# Patient Record
Sex: Female | Born: 1965
Health system: Southern US, Community
[De-identification: ages and names within clinical notes are randomized; demographics above are authoritative.]

## PROBLEM LIST (undated history)

## (undated) DIAGNOSIS — M1990 Unspecified osteoarthritis, unspecified site: Secondary | ICD-10-CM

## (undated) DIAGNOSIS — F17200 Nicotine dependence, unspecified, uncomplicated: Secondary | ICD-10-CM

## (undated) DIAGNOSIS — R55 Syncope and collapse: Secondary | ICD-10-CM

## (undated) DIAGNOSIS — E785 Hyperlipidemia, unspecified: Secondary | ICD-10-CM

## (undated) DIAGNOSIS — I10 Essential (primary) hypertension: Secondary | ICD-10-CM

## (undated) DIAGNOSIS — J45909 Unspecified asthma, uncomplicated: Secondary | ICD-10-CM

## (undated) DIAGNOSIS — D259 Leiomyoma of uterus, unspecified: Secondary | ICD-10-CM

## (undated) HISTORY — DX: Syncope and collapse: R55

## (undated) HISTORY — DX: Unspecified asthma, uncomplicated: J45.909

## (undated) HISTORY — PX: TUBAL LIGATION: SHX77

## (undated) HISTORY — DX: Nicotine dependence, unspecified, uncomplicated: F17.200

## (undated) HISTORY — PX: LAPAROSCOPIC CHOLECYSTECTOMY: SUR755

## (undated) HISTORY — DX: Essential (primary) hypertension: I10

## (undated) HISTORY — DX: Leiomyoma of uterus, unspecified: D25.9

## (undated) HISTORY — DX: Hyperlipidemia, unspecified: E78.5

## (undated) HISTORY — PX: VEIN SURGERY: SHX48

## (undated) HISTORY — DX: Unspecified osteoarthritis, unspecified site: M19.90

---

## 1998-03-04 ENCOUNTER — Emergency Department (HOSPITAL_COMMUNITY): Admission: EM | Admit: 1998-03-04 | Discharge: 1998-03-04 | Payer: Self-pay | Admitting: Internal Medicine

## 1999-12-11 ENCOUNTER — Other Ambulatory Visit: Admission: RE | Admit: 1999-12-11 | Discharge: 1999-12-11 | Payer: Self-pay | Admitting: *Deleted

## 2000-11-16 HISTORY — PX: HYSTEROSCOPY: SHX211

## 2001-02-22 ENCOUNTER — Other Ambulatory Visit: Admission: RE | Admit: 2001-02-22 | Discharge: 2001-02-22 | Payer: Self-pay | Admitting: *Deleted

## 2003-09-04 ENCOUNTER — Other Ambulatory Visit: Admission: RE | Admit: 2003-09-04 | Discharge: 2003-09-04 | Payer: Self-pay | Admitting: *Deleted

## 2004-09-15 ENCOUNTER — Other Ambulatory Visit: Admission: RE | Admit: 2004-09-15 | Discharge: 2004-09-15 | Payer: Self-pay | Admitting: *Deleted

## 2005-09-21 ENCOUNTER — Other Ambulatory Visit: Admission: RE | Admit: 2005-09-21 | Discharge: 2005-09-21 | Payer: Self-pay | Admitting: *Deleted

## 2005-12-03 ENCOUNTER — Ambulatory Visit (HOSPITAL_BASED_OUTPATIENT_CLINIC_OR_DEPARTMENT_OTHER): Admission: RE | Admit: 2005-12-03 | Discharge: 2005-12-03 | Payer: Self-pay | Admitting: Gynecology

## 2005-12-03 ENCOUNTER — Encounter (INDEPENDENT_AMBULATORY_CARE_PROVIDER_SITE_OTHER): Payer: Self-pay | Admitting: Specialist

## 2006-11-04 ENCOUNTER — Other Ambulatory Visit: Admission: RE | Admit: 2006-11-04 | Discharge: 2006-11-04 | Payer: Self-pay | Admitting: Gynecology

## 2006-11-24 ENCOUNTER — Encounter: Admission: RE | Admit: 2006-11-24 | Discharge: 2006-11-24 | Payer: Self-pay | Admitting: Gynecology

## 2008-01-08 ENCOUNTER — Other Ambulatory Visit: Admission: RE | Admit: 2008-01-08 | Discharge: 2008-01-08 | Payer: Self-pay | Admitting: Gynecology

## 2008-01-16 ENCOUNTER — Encounter: Admission: RE | Admit: 2008-01-16 | Discharge: 2008-01-16 | Payer: Self-pay | Admitting: Gynecology

## 2010-08-19 ENCOUNTER — Other Ambulatory Visit: Payer: Self-pay | Admitting: Family Medicine

## 2010-08-19 DIAGNOSIS — Z1239 Encounter for other screening for malignant neoplasm of breast: Secondary | ICD-10-CM

## 2010-08-25 ENCOUNTER — Ambulatory Visit
Admission: RE | Admit: 2010-08-25 | Discharge: 2010-08-25 | Disposition: A | Discharge status: Home / Self Care | Payer: 59 | Source: Ambulatory Visit | Attending: Family Medicine | Admitting: Family Medicine

## 2010-08-25 DIAGNOSIS — Z1239 Encounter for other screening for malignant neoplasm of breast: Secondary | ICD-10-CM

## 2010-08-26 ENCOUNTER — Ambulatory Visit: Payer: Self-pay

## 2010-12-04 NOTE — H&P (Signed)
NAMEDEVANEE, POMPLUN           ACCOUNT NO.:  [id number]   MEDICAL RECORD NO.:  [id number]          PATIENT TYPE:  AMB   LOCATION:  [facility]                         FACILITY:  [facility]   PHYSICIAN:  Timothy P. Fontaine, M.D.DATE OF BIRTH:  [birth year]   DATE OF ADMISSION:  12/03/2005  DATE OF DISCHARGE:                                HISTORY & PHYSICAL   CHIEF COMPLAINT:  Menorrhagia.   HISTORY OF PRESENT ILLNESS:  A 45 year old G22, P3 female, history of tubal  sterilization with a history of menorrhagia.  Outpatient evaluation included  a normal TSH, a hemoglobin of 13.9, and a sonohysterogram which shows an 18  mm uterine polyp.  The patient is admitted at this time for hysteroscopy,  removal of the polyp.   PAST MEDICAL HISTORY:  Uncomplicated.   PAST SURGICAL HISTORY:  1.  Tubal sterilization.  2.  Cholecystectomy.  3.  Vein stripping.   CURRENT MEDICATIONS:  Vitamins.   ALLERGIES:  No medications.   REVIEW OF SYSTEMS:  Noncontributory.   FAMILY HISTORY:  Noncontributory.   SOCIAL HISTORY:  Noncontributory.   ADMISSION PHYSICAL EXAMINATION:  VITAL SIGNS:  Afebrile.  Vital signs are  stable.  HEENT:  Normal.  LUNGS:  Clear.  CARDIAC:  Regular rate.  No murmurs, rubs, or gallops.  ABDOMEN:  Benign.  PELVIC:  External BUS, vagina normal.  Cervix is normal.  Uterus grossly  normal in size, midline, and mobile.  Adnexa without masses or tenderness.  Rectovaginal examination is normal.   ASSESSMENT:  A 45 year old G3, P3 female, history of menorrhagia.  Hemoglobin 13.  TSH normal.  Ultrasound shows small posterior myoma 30 mm  and an 18 mm endometrial polyp.  Patient is admitted for hysteroscopy,  removal of the polyp, D&C.  I reviewed with the patient what is involved  with the procedure to include instrumentation, dilatation, and sharp  curettage.  The use of the resectoscope was reviewed and the acute  intraoperative/postoperative risks were discussed including  infection,  prolonged antibiotics, hemorrhage necessitating transfusion, and the risks  of transfusion.  The risks of inadvertent injury to internal organs with  uterine perforation including bowel, bladder, ureters, vessels, and nerves  necessitating major exploratory reparative surgeries and future reparative  surgeries including ostomy formation was all discussed, understood, and  accepted.  The risks of distended media absorption leading to metabolic  complications such as coma and seizures was also  reviewed with her.  She understands there are no guarantees as far as  menorrhagia relief, that her heavy periods may continue/change following the  procedure and she understands and accepts this.  Patient's questions are  answered to her satisfaction.  She is ready to proceed with surgery.      Timothy P. Fontaine, [name]  Electronically Signed     TPF/MEDQ  D:  12/03/2005  T:  12/03/2005  Job:  [id number]

## 2010-12-04 NOTE — Op Note (Signed)
[name], Laurie Chang           ACCOUNT NO.:  [id number]   MEDICAL RECORD NO.:  [id number]          PATIENT TYPE:  AMB   LOCATION:  [facility]                         FACILITY:  [facility]   PHYSICIAN:  Timothy P. Fontaine, M.D.DATE OF BIRTH:  14-Jul-1966   DATE OF PROCEDURE:  12/03/2005  DATE OF DISCHARGE:                                 OPERATIVE REPORT   PREOPERATIVE DIAGNOSES:  Endometrial polyp, leiomyomata, menorrhagia.   POSTOPERATIVE DIAGNOSES:  Endometrial polyp, leiomyomata, menorrhagia.   PROCEDURE:  Hysteroscopic submucous myomectomy, endometrial polypectomy,  dilatation and curettage.   SURGEON:  Timothy P. Fontaine, [name]   ANESTHESIA:  General.   ESTIMATED BLOOD LOSS:  Minimal.   SORBITOL DISCREPANCY:  Approximately 400 mL.   SPECIMENS:  1.  Endometrial curetting.  2.  Endometrial resection.  3.  Submucous myoma.   COMPLICATIONS:  None   FINDINGS:  Hysteroscopic adequate with fundus, anterior and posterior  surfaces, lower uterine segment, endocervical canal, right and left tubal  ostia all visualized. Undulating of polypoid gross posterior uterine surface  resected. Subsequent to D&C submucous myoma approximately 1/2 diameter  protruding into the cavity. Progressive resection removed all of the myoma   PROCEDURE:  The patient was taken to the operating room, underwent general  anesthesia, was placed in the low dorsal lithotomy position, received a  perineal vaginal preparation per nursing personnel.  The bladder emptied  with in-and-out Foley catheterization done in sterile technique.  EUA  performed and the patient was draped in the usual fashion.  The cervix was  visualized with a speculum.  The anterior lip grasped with a single-tooth  tenaculum and the cervix was gently and gradually dilated to admit the  operative hysteroscope.  Hysteroscopy was performed with findings noted  above.  Using the right angle resectoscopic loop, the endometrial polyps  were  resected at the level of the surrounding endometrium.  These were sent  as a separate specimens. Subsequently, a sharp curettage was then performed  and, again, sent to pathology.  After the sharp curettage, the posterior  submucous myoma became more apparent with approximately 1/2 of the diameter  protruding into the cavity.  Through progressive passes and resection using  the right angle resectoscopic loop, the myoma was resected with more and  more of the myoma protruding into the cavity with each decompression of the  uterine cavity.  Subsequently, the entire myoma was resected without  difficulty and at the end of the procedure, hysteroscopy was performed  showing good distension of the uterine cavity, no evidence of perforation,  and good hemostasis.  The instruments were then all removed.  The  tenaculum site that showed good hemostasis.  The speculum was removed.  The  patient was placed in the supine position, awakened without difficulty, and  taken to the recovery room in good condition having tolerated the procedure  well.      Timothy P. Fontaine, [name]  Electronically Signed     TPF/MEDQ  D:  12/03/2005  T:  12/04/2005  Job:  [id number]

## 2010-12-07 ENCOUNTER — Other Ambulatory Visit: Payer: Self-pay | Admitting: Family Medicine

## 2010-12-07 ENCOUNTER — Ambulatory Visit: Payer: 59 | Admitting: Gynecology

## 2010-12-07 ENCOUNTER — Ambulatory Visit (HOSPITAL_COMMUNITY)
Admission: RE | Admit: 2010-12-07 | Discharge: 2010-12-07 | Disposition: A | Discharge status: Home / Self Care | Payer: 59 | Source: Ambulatory Visit | Attending: Family Medicine | Admitting: Family Medicine

## 2010-12-07 DIAGNOSIS — M47814 Spondylosis without myelopathy or radiculopathy, thoracic region: Secondary | ICD-10-CM | POA: Insufficient documentation

## 2010-12-07 DIAGNOSIS — R1031 Right lower quadrant pain: Secondary | ICD-10-CM

## 2010-12-07 DIAGNOSIS — M47817 Spondylosis without myelopathy or radiculopathy, lumbosacral region: Secondary | ICD-10-CM | POA: Insufficient documentation

## 2010-12-07 DIAGNOSIS — N9489 Other specified conditions associated with female genital organs and menstrual cycle: Secondary | ICD-10-CM | POA: Insufficient documentation

## 2010-12-07 DIAGNOSIS — D259 Leiomyoma of uterus, unspecified: Secondary | ICD-10-CM | POA: Insufficient documentation

## 2010-12-07 DIAGNOSIS — Z9089 Acquired absence of other organs: Secondary | ICD-10-CM | POA: Insufficient documentation

## 2010-12-07 DIAGNOSIS — N838 Other noninflammatory disorders of ovary, fallopian tube and broad ligament: Secondary | ICD-10-CM | POA: Insufficient documentation

## 2010-12-07 MED ORDER — IOHEXOL 300 MG/ML  SOLN
100.0000 mL | Freq: Once | INTRAMUSCULAR | Status: AC | PRN
  Administered 2010-12-07: 100 mL via INTRAVENOUS

## 2010-12-08 ENCOUNTER — Other Ambulatory Visit: Payer: 59 | Admitting: Gynecology

## 2010-12-08 ENCOUNTER — Ambulatory Visit (INDEPENDENT_AMBULATORY_CARE_PROVIDER_SITE_OTHER): Payer: 59 | Admitting: Gynecology

## 2010-12-08 ENCOUNTER — Other Ambulatory Visit: Payer: 59

## 2010-12-08 DIAGNOSIS — N852 Hypertrophy of uterus: Secondary | ICD-10-CM

## 2010-12-08 DIAGNOSIS — D259 Leiomyoma of uterus, unspecified: Secondary | ICD-10-CM

## 2010-12-08 DIAGNOSIS — R109 Unspecified abdominal pain: Secondary | ICD-10-CM

## 2010-12-08 DIAGNOSIS — R823 Hemoglobinuria: Secondary | ICD-10-CM

## 2010-12-08 DIAGNOSIS — N920 Excessive and frequent menstruation with regular cycle: Secondary | ICD-10-CM

## 2010-12-08 DIAGNOSIS — N949 Unspecified condition associated with female genital organs and menstrual cycle: Secondary | ICD-10-CM

## 2010-12-09 ENCOUNTER — Other Ambulatory Visit: Payer: Self-pay | Admitting: Gynecology

## 2010-12-09 ENCOUNTER — Encounter (HOSPITAL_COMMUNITY): Payer: 59

## 2010-12-09 LAB — URINALYSIS, ROUTINE W REFLEX MICROSCOPIC
Bilirubin Urine: NEGATIVE
Ketones, ur: NEGATIVE mg/dL
Nitrite: NEGATIVE
Protein, ur: NEGATIVE mg/dL
Urobilinogen, UA: 0.2 mg/dL (ref 0.0–1.0)

## 2010-12-09 LAB — CBC
MCH: 30.7 pg (ref 26.0–34.0)
MCHC: 33.4 g/dL (ref 30.0–36.0)
MCV: 91.9 fL (ref 78.0–100.0)
Platelets: 335 10*3/uL (ref 150–400)
RDW: 12.9 % (ref 11.5–15.5)
WBC: 12.4 10*3/uL — ABNORMAL HIGH (ref 4.0–10.5)

## 2010-12-09 LAB — COMPREHENSIVE METABOLIC PANEL
ALT: 13 U/L (ref 0–35)
Alkaline Phosphatase: 53 U/L (ref 39–117)
BUN: 10 mg/dL (ref 6–23)
Chloride: 102 mEq/L (ref 96–112)
Glucose, Bld: 111 mg/dL — ABNORMAL HIGH (ref 70–99)
Potassium: 3.6 mEq/L (ref 3.5–5.1)
Total Bilirubin: 0.2 mg/dL — ABNORMAL LOW (ref 0.3–1.2)

## 2010-12-09 LAB — SURGICAL PCR SCREEN: Staphylococcus aureus: INVALID — AB

## 2010-12-10 ENCOUNTER — Ambulatory Visit: Payer: 59 | Admitting: Gynecology

## 2010-12-10 NOTE — H&P (Addendum)
Laurie Chang, Laurie Chang           ACCOUNT NO.:  [id number]  MEDICAL RECORD NO.:  [id number]  LOCATION:  [facility]                           FACILITY:  [facility]  PHYSICIAN:  [name], M.D.DATE OF BIRTH:  [birth year]  DATE OF ADMISSION:  12/09/2010 DATE OF DISCHARGE:                             HISTORY & PHYSICAL   CHIEF COMPLAINT:  Symptomatic leiomyomatous uteri.  HISTORY:  The patient is a 45 year old gravida 3, para 3 (normal spontaneous vaginal deliveries) who had been seen in our office on Dec 08, 2010 as a referral from [facility] as a result of acute right lower quadrant pain which had started the previous week on Thursday and Friday, but said at the weekend that she felt fine and her pain restarted back again on Monday in her right lower quadrant. Her bowel movements have been reported to be normal as well as her urination.  She denied any vomiting, a little bit of nausea few days ago.  Her menstrual cycles had been regular.  Her last menstrual period was reported to be Dec 01, 2010.  She has had a previous tubal sterilization procedure.  When she was seen in the [facility], they had done a CBC which demonstrated white count of 10.8, hemoglobin/hematocrit 14.4 and 43.2 respectively with a platelet count of 404,000.  She had a negative pregnancy test and negative urinalysis.  She was then sent to the hospital for a CT with a working diagnosis of appendicitis.  The radiology report demonstrated that a CT scan showed no evidence of small- bowel structure, normal-appearing appendix, and normal abdominal ultrasound.  Pelvic ultrasound, mildly heterogeneous texture reflecting uterine fibroids.  The right ovary within normal limits.  Left ovary, the size was mildly larger, could not tell if it was a fibroid overlapping over the ovary and she was given Percocet for pain and returned to the [facility] and they referred to our office for further evaluation for  which we had proceeded with evaluating her.  She was having some right lower quadrant discomfort and did not have any rebound or guarding.  Good bowel sounds in all 4 quadrants.  She had no CVA tenderness.  On pelvic examination, the uterus upper limits of normal. Right ovary was palpated.  No tenderness.  Left adnexa, you could feel the fibroid coming off the left uterine sidewall and slightly tender with no guarding or rebound.  Her ultrasound in the office demonstrated a uterus that measured 9.4 x 5.9 x 5.8 cm with the largest one measuring 2 cm in size.  Right ovary again normal and right adnexa no apparent masses.  The left ovary was difficult to identify due to the left subserous myoma with cystic solid echo pattern.  A vascular mass measuring 52 x 34 x 32 mm.  On transabdominal images, the left ovary was seen measuring 3 x 1.3 x 2.3 cm with negative color-flow.  With these findings, I explained to her that she probably had a degenerating fibroid causing her discomfort.  Her hemoglobin and hematocrit remainedstable and white count was 12,000.  She was given Toradol 60 mg IM and was to take Toradol 10 mg q.6 h. p.r.n. and to  stop 24 hours before her planned total abdominal hysterectomy.  PAST MEDICAL HISTORY:  She denies any allergies.  OBSTETRIC/GYNECOLOGIC HISTORY:  Three vaginal deliveries.  MEDICATIONS:  Consisted of calcium and multivitamin as well as recently Toradol 10 mg q.6 h. p.r.n.  OTHER MEDICAL CONDITIONS:  She smokes on a regular basis.  Previous surgeries have included bilateral tubal ligation.  She has had laparoscopic cholecystectomy.  She has had vein stripping.  She has had hysteroscopic myomectomy.  FAMILY HISTORY:  Hypertension as well as breast cancer.  REVIEW OF SYSTEMS:  Unremarkable with the exception of the gynecological portion with leiomyomatous uteri.  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  PHYSICAL EXAMINATION:  VITAL  SIGNS:  The patient weighs 196 pounds. She is 5 feet 5-3/4 inches tall.  Blood pressure 118/78. HEENT:  Unremarkable. NECK:  Supple.  Trachea midline.  No carotid bruits.  No thyromegaly. LUNGS:  Clear to auscultation without rhonchi or wheezes. HEART:  Regular rate and rhythm.  No murmurs or gallops. BREASTS:  Not done. ABDOMEN:  Soft, some slight tenderness in the right lower quadrant but no rebound or guarding. PELVIC:  Bartholin, urethra, Skene glands within normal limits.  Vagina and cervix:  No gross lesions on inspection.  Uterus:  Upper limits of normal.  Right adnexa:  Unremarkable with palpable ovary.  Left adnexa: Normal size.  Fibroid coming off the left uterine sidewall, slightly tender on bimanual examination. RECTAL:  Deferred.  ASSESSMENT:  This is a 45 year old gravida 3, para 3 with previous sterilization procedure with symptomatic leiomyomatous uteri contributing to the pain and attributed to cystic degeneration.  The patient is scheduled to undergo a total abdominal hysterectomy with ovarian conservation this Friday, Dec 11, 2010 at 1:00 p.m.  The risks, benefits, and pros and cons of the operation were discussed with the patient to include the following:  The risk of infection for which she will receive prophylaxis antibiotic.  The risk for deep venous thrombosis and pulmonary embolism because of her age and her smoking history but we will take preventive measures such as placing PSA stockings.  The risk of hemorrhage requiring blood or blood transfusion or blood products with potential risk of anaphylactic reaction to hepatitis and acquired immune deficiency syndrome were discussed and also potential risk of injury or trauma to internal organs.  All these issues were discussed with the patient in detail.  All questions were answered.  We will follow accordingly.  Of note, we also discussed the possibility that she may  lose her left tube and ovary as well but we will make every effort to leave at least one ovary.  All questions were answered and we will follow accordingly.  PLAN:  The patient is scheduled for total abdominal hysterectomy, possible left salpingo-oophorectomy on Friday, Dec 11, 2010 at 1:00 p.m. at [facility].  Please have history and physical available.     Taylin Mans H. Lily Peer, [name]     JHF/MEDQ  D:  12/09/2010  T:  12/10/2010  Job:  [id number]  Electronically Signed by Reynaldo Minium [name] on 01/07/2011 04:57:54 PM

## 2010-12-11 ENCOUNTER — Other Ambulatory Visit: Payer: Self-pay | Admitting: Obstetrics and Gynecology

## 2010-12-11 ENCOUNTER — Inpatient Hospital Stay (HOSPITAL_COMMUNITY)
Admission: EM | Admit: 2010-12-11 | Discharge: 2010-12-14 | DRG: 743 | Disposition: A | Discharge status: Home / Self Care | Payer: 59 | Source: Ambulatory Visit | Attending: Gynecology | Admitting: Gynecology

## 2010-12-11 DIAGNOSIS — R1031 Right lower quadrant pain: Secondary | ICD-10-CM | POA: Diagnosis present

## 2010-12-11 DIAGNOSIS — N946 Dysmenorrhea, unspecified: Principal | ICD-10-CM | POA: Diagnosis present

## 2010-12-11 DIAGNOSIS — Z01818 Encounter for other preprocedural examination: Secondary | ICD-10-CM

## 2010-12-11 DIAGNOSIS — N949 Unspecified condition associated with female genital organs and menstrual cycle: Secondary | ICD-10-CM

## 2010-12-11 DIAGNOSIS — R110 Nausea: Secondary | ICD-10-CM | POA: Diagnosis present

## 2010-12-11 DIAGNOSIS — N920 Excessive and frequent menstruation with regular cycle: Secondary | ICD-10-CM | POA: Diagnosis present

## 2010-12-11 DIAGNOSIS — D259 Leiomyoma of uterus, unspecified: Secondary | ICD-10-CM | POA: Diagnosis present

## 2010-12-11 DIAGNOSIS — Z01812 Encounter for preprocedural laboratory examination: Secondary | ICD-10-CM

## 2010-12-11 HISTORY — PX: ABDOMINAL HYSTERECTOMY: SHX81

## 2010-12-11 HISTORY — PX: OOPHORECTOMY: SHX86

## 2010-12-12 LAB — CBC
Hemoglobin: 10.7 g/dL — ABNORMAL LOW (ref 12.0–15.0)
MCH: 30.2 pg (ref 26.0–34.0)
MCHC: 32.8 g/dL (ref 30.0–36.0)
MCV: 92.1 fL (ref 78.0–100.0)
Platelets: 319 10*3/uL (ref 150–400)

## 2010-12-12 LAB — MRSA CULTURE

## 2010-12-15 NOTE — Discharge Summary (Signed)
  Laurie Chang, Laurie Chang           ACCOUNT NO.:  [id number]  MEDICAL RECORD NO.:  [id number]           PATIENT TYPE:  I  LOCATION:  9310                          FACILITY:  [facility]  PHYSICIAN:  Daniel L. Gottsegen, M.D.DATE OF BIRTH:  10/22/1965  DATE OF ADMISSION:  12/11/2010 DATE OF DISCHARGE:  12/14/2010                              DISCHARGE SUMMARY   The patient is a 45 year old female who was admitted to the hospital by Dr. Lily Peer with fibroids associated with dysmenorrhea, menorrhagia, and pelvic pain.  On the day of admission, she was taken to the operating room and she underwent a total abdominal hysterectomy, right salpingo-oophorectomy.  Postoperatively, she continued to progress and by the second postoperative day, was passing gas and tolerating oral diet and was voiding well, so she was discharged.  She will be seen in the office in 2 days for staple removal. Addendum: Although patient was originally discharged on the second postoperative day, before she left she had increasing nausea and therefore the discharge was cancelled. Her pain medication was changed and her  nausea cleared so she went home the next day.  DISCHARGE MEDICATIONS:  Reglan and Lortab.  DISCHARGE DIET:  Regular.  Final pathology report is not available at the time of dictation. Admission comprehensive metabolic panel was normal except for a slightly elevated glucose at 111.  This admission CBC was normal.  Admission urinalysis was normal.  CONDITION ON DISCHARGE:  Improved.  DISCHARGE DIAGNOSIS:  Symptomatic fibroids.  OPERATIONS:  Total abdominal hysterectomy, right salpingo-oophorectomy.     Daniel L. Eda Paschal, [name]     Tonette Bihari  D:  12/13/2010  T:  12/13/2010  Job:  [id number]  Electronically Signed by Edyth Gunnels [name] on 12/15/2010 08:38:52 AM

## 2011-01-01 ENCOUNTER — Ambulatory Visit (INDEPENDENT_AMBULATORY_CARE_PROVIDER_SITE_OTHER): Payer: 59 | Admitting: Gynecology

## 2011-01-01 DIAGNOSIS — Z9889 Other specified postprocedural states: Secondary | ICD-10-CM

## 2011-01-05 NOTE — Op Note (Signed)
Laurie Chang, Laurie Chang           ACCOUNT NO.:  [id number]  MEDICAL RECORD NO.:  [id number]           PATIENT TYPE:  I  LOCATION:  9310                          [facility]ACILITY:  [facility]  PHYSICIAN:  Clarita Mcelvain H. Lily Peer, M.D.DATE OF BIRTH:  [birth year]  DATE OF PROCEDURE:  12/11/2010 DATE OF DISCHARGE:                              OPERATIVE REPORT   SURGEON:  Bogdan Vivona H. Lily Peer, MD  FIRST ASSISTANT:  Colin Broach, MD  INDICATIONS FOR OPERATION:  A 45 year old gravida 3, para 3 with leiomyomatous uteri and sharp right lower quadrant pain.  PREOPERATIVE DIAGNOSIS:  Symptomatic leiomyomatous uteri.  POSTOPERATIVE DIAGNOSIS:  Symptomatic leiomyomatous uteri.  ANESTHESIA:  General endotracheal anesthesia.  PROCEDURE PERFORMED:  Total abdominal hysterectomy with right salpingo- oophorectomy.  FINDINGS:  The patient with a leiomyomatous uteri especially on the left adnexa, broad ligament leiomyomas multiple were noted otherwise the left tube and ovary were normal in appearance.  Appendix grossly normal.  No other abnormality was noted in the pelvic cavity.  DESCRIPTION OF OPERATION:  After the patient was adequately counseled she was taken to the operating room.  She underwent successful general endotracheal anesthesia.  She received a gram of cefotetan for prophylaxis and also had PSA stockings for DVT prophylaxis as well. Once successful general endotracheal anesthesia was obtained, the patient was placed in the supine position.  The abdomen, perineum, and vagina were prepped and draped in usual fashion.  A Foley catheter had been inserted in an effort to monitor urinary output after the drapes were in place a Pfannenstiel skin incision was made 2 cm above the symphysis pubis.  The incision was carried out through skin and subcutaneous tissue down to the rectus fascia whereby midline nick was made and the fascia was incised in the transverse fashion.  The peritoneal cavity was entered  cautiously.  At this time a thorough inspection of the pelvic cavity had demonstrated the multilobulated left broad ligament leiomyomas and normal-appearing left and right ovary and normal-appearing appendix.  The O'Connor-O'Sullivan retractor was then placed.  The patient was placed in Trendelenburg position and both triple pedicles on the right and left side of the uterus were grasped with Heaney clamps.  The right and left ureters were identified.  Due to the patient's chronicity of her right adnexal pain, we had discussed about removing her right tube and ovary as per request regardless of findings, so the right round ligament was trans fixated with 0 Vicryl suture and then transected and the right infundibulopelvic ligament was identified and was clamped and cut and free tied with 0 Vicryl suture followed by transfixion stitch of 0 Vicryl suture thus removal of the right tube and ovary.  The remainder of the broad and cardinal ligament were serially clamped, cut and suture ligated with 0 Vicryl suture.  The bladder flap was established by peeling down the bladder from the lower uterine segment.  Attention was then placed into the left adnexa region. The left round ligament was suture ligated 0 Vicryl suture and transected with the Bovie with meticulous dissection and from the broad ligament to free the myoma.  The left tube and ovary had been  left in place, and the proximal portion of the left fallopian tube and the left utero-ovarian ligament were clamped, cut and suture ligated with 0 Vicryl suture followed by trans fixation suture to secure the left adnexa.  Thus leaving the left tube and ovary behind.  The remainder of the broadened cardinal ligaments were similarly clamped, cut, and suture ligated with 0 Vicryl suture and peeling the bladder away from the left lower uterine segment.  Two Heaney clamps were placed hugging the cervix and the right and left vaginal fornix  respectively and with Jorgenson scissors the cervix was excised from the fornix and the cervix, uterus, right tube and ovary were passed off the operative field for pathological evaluation.  Both angle sutures were secured with trans fixation stitch of 0 Vicryl suture and the remaining vaginal cuff was closed with a figure-of-eight of 0 Vicryl suture.  The pelvic cavity was then copiously irrigated with normal saline solution for additional hemostasis.  Surgicel was placed at the vaginal cuff as well.  Sponge and needle count were correct.  The O'Connor-O'Sullivan retractor were removed.  The visceral peritoneum was not reapproximated but the fascia was closed with running stitch of 0 Vicryl suture.  The subcutaneous bleeders were Bovie cauterized, and the skin was reapproximated with skin clips followed by Xeroform gauze and followed by dressing.  Prior to placing the drapes 0.25% Marcaine was infiltrated and the incision for postoperative analgesia for a total of 10 mL.  The patient was extubated and transferred to recovery room in stable vital signs.  Blood loss from procedure was 150 mL, IV fluids consisted of 700 mL of lactated Ringer.  Urine output 550 mL and clear.     Carisa Backhaus H. Lily Peer, [name]     JHF/MEDQ  D:  12/11/2010  T:  12/12/2010  Job:  [id number]  Electronically Signed by Reynaldo Minium [name] on 01/05/2011 12:27:00 PM

## 2011-01-14 ENCOUNTER — Ambulatory Visit (INDEPENDENT_AMBULATORY_CARE_PROVIDER_SITE_OTHER): Payer: 59 | Admitting: Gynecology

## 2011-01-14 DIAGNOSIS — Z9889 Other specified postprocedural states: Secondary | ICD-10-CM

## 2011-02-19 ENCOUNTER — Other Ambulatory Visit: Payer: Self-pay | Admitting: *Deleted

## 2011-02-19 NOTE — Telephone Encounter (Signed)
Called [facility] and denied refill request of generic Lortab 7.5 #30  per JF [phone]

## 2011-02-23 ENCOUNTER — Other Ambulatory Visit: Payer: Self-pay | Admitting: *Deleted

## 2011-02-23 MED ORDER — HYDROCODONE-ACETAMINOPHEN 7.5-500 MG PO TABS: 1.0000 | ORAL_TABLET | ORAL | Status: AC | PRN

## 2011-02-23 NOTE — Telephone Encounter (Signed)
PHARMACY FAXED RX REQUEST.PLEASE SIGN ORDER IF PT CAN HAVE REFILLS. THEN I WILL CALL IN RX.

## 2011-02-23 NOTE — Telephone Encounter (Signed)
RX CALLED INTO [facility]. 

## 2011-03-18 ENCOUNTER — Other Ambulatory Visit: Payer: Self-pay | Admitting: *Deleted

## 2011-03-18 MED ORDER — HYDROCODONE-ACETAMINOPHEN 7.5-750 MG PO TABS: 1.0000 | ORAL_TABLET | Freq: Four times a day (QID) | ORAL | Status: AC | PRN

## 2011-03-18 NOTE — Telephone Encounter (Signed)
RX CALLED IN TO [facility] ON [address]

## 2011-03-18 NOTE — Telephone Encounter (Signed)
PHARMACY FAXED REFILL REQUEST.

## 2011-04-26 ENCOUNTER — Other Ambulatory Visit: Payer: Self-pay | Admitting: *Deleted

## 2011-04-27 NOTE — Telephone Encounter (Signed)
rx refusal called in.

## 2011-04-29 ENCOUNTER — Other Ambulatory Visit: Payer: Self-pay | Admitting: *Deleted

## 2011-09-15 ENCOUNTER — Ambulatory Visit (INDEPENDENT_AMBULATORY_CARE_PROVIDER_SITE_OTHER): Payer: 59 | Admitting: Emergency Medicine

## 2011-09-15 VITALS — BP 130/80 | HR 78 | Temp 98.2°F | Resp 16 | Ht 66.0 in | Wt 177.4 lb

## 2011-09-15 DIAGNOSIS — R05 Cough: Secondary | ICD-10-CM

## 2011-09-15 DIAGNOSIS — J029 Acute pharyngitis, unspecified: Secondary | ICD-10-CM

## 2011-09-15 DIAGNOSIS — R059 Cough, unspecified: Secondary | ICD-10-CM

## 2011-09-15 LAB — POCT RAPID STREP A (OFFICE): Rapid Strep A Screen: NEGATIVE

## 2011-09-15 MED ORDER — DOXYCYCLINE HYCLATE 100 MG PO TABS: 100.0000 mg | ORAL_TABLET | Freq: Two times a day (BID) | ORAL | Status: AC

## 2011-09-15 MED ORDER — HYDROCODONE-HOMATROPINE 5-1.5 MG/5ML PO SYRP: 5.0000 mL | ORAL_SOLUTION | Freq: Three times a day (TID) | ORAL | Status: AC | PRN

## 2011-09-15 NOTE — Patient Instructions (Signed)
Cough, Adult  A cough is a reflex that helps clear your throat and airways. It can help heal the body or may be a reaction to an irritated airway. A cough may only last 2 or 3 weeks (acute) or may last more than 8 weeks (chronic).  CAUSES Acute cough:  Viral or bacterial infections.  Chronic cough:  Infections.   Allergies.   Asthma.   Post-nasal drip.   Smoking.   Heartburn or acid reflux.   Some medicines.   Chronic lung problems (COPD).   Cancer.  SYMPTOMS   Cough.   Fever.   Chest pain.   Increased breathing rate.   High-pitched whistling sound when breathing (wheezing).   Colored mucus that you cough up (sputum).  TREATMENT   A bacterial cough may be treated with antibiotic medicine.   A viral cough must run its course and will not respond to antibiotics.   Your caregiver may recommend other treatments if you have a chronic cough.  HOME CARE INSTRUCTIONS   Only take over-the-counter or prescription medicines for pain, discomfort, or fever as directed by your caregiver. Use cough suppressants only as directed by your caregiver.   Use a cold steam vaporizer or humidifier in your bedroom or home to help loosen secretions.   Sleep in a semi-upright position if your cough is worse at night.   Rest as needed.   Stop smoking if you smoke.  SEEK IMMEDIATE MEDICAL CARE IF:   You have pus in your sputum.   Your cough starts to worsen.   You cannot control your cough with suppressants and are losing sleep.   You begin coughing up blood.   You have difficulty breathing.   You develop pain which is getting worse or is uncontrolled with medicine.   You have a fever.  MAKE SURE YOU:   Understand these instructions.   Will watch your condition.   Will get help right away if you are not doing well or get worse.  Document Released: 01/01/2011 Document Revised: 03/17/2011 Document Reviewed: 01/01/2011 [facility] Patient Information 2012 [place],  [place].Sore Throat Sore throats may be caused by bacteria and viruses. They may also be caused by:  Smoking.   Pollution.   Allergies.  If a sore throat is due to strep infection (a bacterial infection), you may need:  A throat swab.   A culture test to verify the strep infection.  You will need one of these:  An antibiotic shot.   Oral medicine for a full 10 days.  Strep infection is very contagious. A doctor should check any close contacts who have a sore throat or fever. A sore throat caused by a virus infection will usually last only 3-4 days. Antibiotics will not treat a viral sore throat.  Infectious mononucleosis (a viral disease), however, can cause a sore throat that lasts for up to 3 weeks. Mononucleosis can be diagnosed with blood tests. You must have been sick for at least 1 week in order for the test to give accurate results. HOME CARE INSTRUCTIONS   To treat a sore throat, take mild pain medicine.   Increase your fluids.   Eat a soft diet.   Do not smoke.   Gargling with warm water or salt water (1 tsp. salt in 8 oz. water) can be helpful.   Try throat sprays or lozenges or sucking on hard candy to ease the symptoms.  Call your doctor if your sore throat lasts longer than 1 week.  SEEK IMMEDIATE MEDICAL CARE IF:  You have difficulty breathing.   You have increased swelling in the throat.   You have pain so severe that you are unable to swallow fluids or your saliva.   You have a severe headache, a high fever, vomiting, or a red rash.  Document Released: 08/12/2004 Document Revised: 03/17/2011 Document Reviewed: 06/22/2007 [facility] Patient Information 2012 [place], [place].

## 2011-09-15 NOTE — Progress Notes (Signed)
  Subjective:    Patient ID: Laurie Chang, female    DOB: 06/06/1966, 46 y.o.   MRN: [id number]  Cough This is a new problem. The current episode started in the past 7 days. The problem has been gradually worsening. The cough is productive of sputum. Associated symptoms include ear congestion, postnasal drip, rhinorrhea and a sore throat. Pertinent negatives include no chest pain, fever or headaches.  Sore Throat  Associated symptoms include coughing. Pertinent negatives include no headaches.      Review of Systems  Constitutional: Negative for fever.  HENT: Positive for sore throat, rhinorrhea and postnasal drip.   Respiratory: Positive for cough.   Cardiovascular: Negative for chest pain.  Neurological: Negative for headaches.       Objective:   Physical Exam HEENT exam reveals TMs to be red bilaterally. Nasal turbinates are congested. There is purulent drainage on the left. Throat is inflamed and red. Neck is supple without adenopathy chest exam reveals no rales or rhonchi however with deep inspiration patient has a paroxysmal cough        Assessment & Plan:  Patient here with sore throat productive cough. She is a smoker but is trying to quit. She is under a lot of stress recently. Her husband died approximately one year ago

## 2011-11-17 ENCOUNTER — Ambulatory Visit: Payer: 59

## 2011-11-17 ENCOUNTER — Other Ambulatory Visit: Payer: Self-pay | Admitting: Family Medicine

## 2011-11-17 ENCOUNTER — Ambulatory Visit (INDEPENDENT_AMBULATORY_CARE_PROVIDER_SITE_OTHER): Payer: 59 | Admitting: Family Medicine

## 2011-11-17 VITALS — BP 167/85 | HR 80 | Temp 98.6°F | Resp 16 | Ht 65.5 in | Wt 167.0 lb

## 2011-11-17 DIAGNOSIS — Z7251 High risk heterosexual behavior: Secondary | ICD-10-CM

## 2011-11-17 DIAGNOSIS — B009 Herpesviral infection, unspecified: Secondary | ICD-10-CM

## 2011-11-17 DIAGNOSIS — Z1231 Encounter for screening mammogram for malignant neoplasm of breast: Secondary | ICD-10-CM

## 2011-11-17 DIAGNOSIS — Z0000 Encounter for general adult medical examination without abnormal findings: Secondary | ICD-10-CM

## 2011-11-17 DIAGNOSIS — R52 Pain, unspecified: Secondary | ICD-10-CM

## 2011-11-17 DIAGNOSIS — Z202 Contact with and (suspected) exposure to infections with a predominantly sexual mode of transmission: Secondary | ICD-10-CM

## 2011-11-17 DIAGNOSIS — R3129 Other microscopic hematuria: Secondary | ICD-10-CM

## 2011-11-17 LAB — COMPREHENSIVE METABOLIC PANEL
ALT: 13 U/L (ref 0–35)
Albumin: 4.7 g/dL (ref 3.5–5.2)
CO2: 26 mEq/L (ref 19–32)
Chloride: 106 mEq/L (ref 96–112)
Glucose, Bld: 91 mg/dL (ref 70–99)
Potassium: 4.1 mEq/L (ref 3.5–5.3)
Sodium: 138 mEq/L (ref 135–145)
Total Bilirubin: 0.3 mg/dL (ref 0.3–1.2)
Total Protein: 7.3 g/dL (ref 6.0–8.3)

## 2011-11-17 LAB — POCT UA - MICROSCOPIC ONLY
Casts, Ur, LPF, POC: NEGATIVE
Crystals, Ur, HPF, POC: NEGATIVE
Epithelial cells, urine per micros: NEGATIVE
Mucus, UA: NEGATIVE
Yeast, UA: NEGATIVE

## 2011-11-17 LAB — POCT URINALYSIS DIPSTICK
Ketones, UA: NEGATIVE
Protein, UA: NEGATIVE
Spec Grav, UA: <=1.005
Urobilinogen, UA: 0.2
pH, UA: 7

## 2011-11-17 LAB — POCT CBC
Granulocyte percent: 64.8 %G (ref 37–80)
HCT, POC: 45.9 % (ref 37.7–47.9)
Lymph, poc: 3.3 (ref 0.6–3.4)
MCHC: 32.5 g/dL (ref 31.8–35.4)
MCV: 93.2 fL (ref 80–97)
MID (cbc): 0.5 (ref 0–0.9)
POC LYMPH PERCENT: 31 %L (ref 10–50)
Platelet Count, POC: 438 10*3/uL — AB (ref 142–424)
RDW, POC: 13.4 %

## 2011-11-17 LAB — TSH: TSH: 1.326 u[IU]/mL (ref 0.350–4.500)

## 2011-11-17 LAB — RPR

## 2011-11-17 LAB — LIPID PANEL: LDL Cholesterol: 118 mg/dL — ABNORMAL HIGH (ref 0–99)

## 2011-11-17 MED ORDER — TRAMADOL HCL 50 MG PO TABS: 50.0000 mg | ORAL_TABLET | Freq: Three times a day (TID) | ORAL | Status: DC | PRN

## 2011-11-17 NOTE — Progress Notes (Addendum)
Patient Name: Laurie Chang Date of Birth: 22-Dec-1965 Medical Record Number: [id number] Gender: female Date of Encounter: 11/17/2011  History of Present Illness:  Laurie Chang is a 46 y.o. very pleasant female patient who presents with the following:  Here today for a CPE.  She had a hysterectomy about a year ago due ot a degerative fibrod tumor.  Otherwise she is doing ok today.   She is fasting currently- would like to have labs done.   [name] has a hysterectomy done last year for a painful fibroid tumor.   Mammogram is scheduled for next week Her husband died last year- she has started to date and be sexually active again.  A condom broke on her and she would like to have testing done today.  The breakage incident happened about a month ago.    She also notes that she has joint aches and pains- especially in her knees- after she works a long shift at work.  Has used vicodin for this in the past with success- however tylenol and ibuprofen have not been effective  There is no problem list on file for this patient.  Past Medical History  Diagnosis Date  . Smoker     1/2 PPD  . Leiomyoma uteri    Past Surgical History  Procedure Date  . Tubal ligation   . Laparoscopic cholecystectomy   . Vein surgery   . Hysteroscopy 11/2000    HYSTEROSCOPIC MYOMECTOMY  . Abdominal hysterectomy 12/11/10    TAH,RSO  . Oophorectomy 12/11/10    TAH,RSO   History  Substance Use Topics  . Smoking status: Current Everyday Smoker  . Smokeless tobacco: Not on file  . Alcohol Use: No   Family History  Problem Relation Age of Onset  . Hypertension Father   . Hypertension Brother    No Known Allergies  Medication list has been reviewed and updated.  Review of Systems: As per HPI- otherwise negative. Tetanus shot 2011  Physical Examination: Filed Vitals:   11/17/11 1346  BP: 167/85  Pulse: 80  Temp: 98.6 F (37 C)  TempSrc: Oral  Resp: 16  Height: 5' 5.5" (1.664 m)   Weight: 167 lb (75.751 kg)  BP recheck 130/90  Body mass index is 27.37 kg/(m^2).  GEN: WDWN, NAD, Non-toxic, A & O x 3 HEENT: Atraumatic, Normocephalic. Neck supple. No masses, No LAD.  PEERL, EOMI, TM and oropharynx wnl Ears and Nose: No external deformity. CV: RRR, No M/G/R. No JVD. No thrill. No extra heart sounds. PULM: CTA B, no wheezes, crackles, rhonchi. No retractions. No resp. distress. No accessory muscle use. ABD: S, NT, ND, +BS. No rebound. No HSM. EXTR: No c/c/e NEURO Normal gait.  PSYCH: Normally interactive. Conversant. Not depressed or anxious appearing.  Calm demeanor.  Breast exam: normal, no masses, discharge or lesion, no skin changes  Results for orders placed in visit on 11/17/11  POCT CBC      Component Value Range   WBC 10.8 (*) 4.6 - 10.2 (K/uL)   Lymph, poc 3.3  0.6 - 3.4    POC LYMPH PERCENT 31.0  10 - 50 (%L)   MID (cbc) 0.5  0 - 0.9    POC MID % 4.2  0 - 12 (%M)   POC Granulocyte 7.0 (*) 2 - 6.9    Granulocyte percent 64.8  37 - 80 (%G)   RBC 4.93  4.04 - 5.48 (M/uL)   Hemoglobin 14.9  12.2 - 16.2 (g/dL)  HCT, POC 45.9  37.7 - 47.9 (%)   MCV 93.2  80 - 97 (fL)   MCH, POC 30.2  27 - 31.2 (pg)   MCHC 32.5  31.8 - 35.4 (g/dL)   RDW, POC 82.9     Platelet Count, POC 438 (*) 142 - 424 (K/uL)   MPV 8.3  0 - 99.8 (fL)  POCT URINALYSIS DIPSTICK      Component Value Range   Color, UA yellow     Clarity, UA clear     Glucose, UA neg     Bilirubin, UA neg     Ketones, UA neg     Spec Grav, UA <=1.005     Blood, UA moderate     pH, UA 7.0     Protein, UA neg     Urobilinogen, UA 0.2     Nitrite, UA neg     Leukocytes, UA Negative    POCT UA - MICROSCOPIC ONLY      Component Value Range   WBC, Ur, HPF, POC 0-1     RBC, urine, microscopic 0-1     Bacteria, U Microscopic neg     Mucus, UA neg     Epithelial cells, urine per micros neg     Crystals, Ur, HPF, POC neg     Casts, Ur, LPF, POC neg     Yeast, UA neg      Assessment and  Plan: 1. Routine general medical examination at a health care facility  POCT CBC, POCT urinalysis dipstick, POCT UA - Microscopic Only, Lipid panel, TSH, Comprehensive metabolic panel  2. Exposure to sexually transmitted disease (STD)  RPR, HIV antibody, Hepatitis B surface antigen, Hepatitis C antibody, HSV(herpes simplex vrs) 1+2 ab-IgG, GC/chlamydia probe amp, urine, Hepatitis B surface antibody  3. Body aches  traMADol (ULTRAM) 50 MG tablet   Await labs- will call with results.  Consider referral to urology as she has a history of microhematuria and smoking.    Dewey has lost about 20 lbs by diet and exercise changes- she is very pleased!  Addnd 5/6:  Called and discussed with Kjersti- she is positive for HSV 2.  Went over this in details. She is upset but understands- afraid she may have caught this when her ex- husband was unfaithful.  She would like to go on suppressive therapy so I called in acyclovir 400 BID.  Discussed how to decrease risk of transmission and how to talk to partners about this infection.    She has already seen urology for microhematuria and been cleared.   Discussed cholesterol- LDL/ HDL could be a little bit better, but she is exercising a lot and actively losing weight.  Will plan to recheck in about 6 months and consider starting a statin if need be at that point.

## 2011-11-18 LAB — HEPATITIS C ANTIBODY: HCV Ab: NEGATIVE

## 2011-11-18 LAB — GC/CHLAMYDIA PROBE AMP, URINE: Chlamydia, Swab/Urine, PCR: NEGATIVE

## 2011-11-18 LAB — HIV ANTIBODY (ROUTINE TESTING W REFLEX): HIV: NONREACTIVE

## 2011-11-19 LAB — HSV(HERPES SIMPLEX VRS) I + II AB-IGG: HSV 1 Glycoprotein G Ab, IgG: 5.61 IV — ABNORMAL HIGH

## 2011-11-22 ENCOUNTER — Encounter: Payer: Self-pay | Admitting: Family Medicine

## 2011-11-22 DIAGNOSIS — R3129 Other microscopic hematuria: Secondary | ICD-10-CM | POA: Insufficient documentation

## 2011-11-22 MED ORDER — ACYCLOVIR 400 MG PO TABS: 400.0000 mg | ORAL_TABLET | Freq: Two times a day (BID) | ORAL | Status: AC

## 2011-11-22 NOTE — Progress Notes (Signed)
Addended by: Abbe Amsterdam C on: 11/22/2011 08:56 PM   Modules accepted: Orders

## 2011-11-25 ENCOUNTER — Ambulatory Visit
Admission: RE | Admit: 2011-11-25 | Discharge: 2011-11-25 | Disposition: A | Discharge status: Home / Self Care | Payer: 59 | Source: Ambulatory Visit | Attending: Family Medicine | Admitting: Family Medicine

## 2011-11-25 DIAGNOSIS — Z1231 Encounter for screening mammogram for malignant neoplasm of breast: Secondary | ICD-10-CM

## 2012-01-02 ENCOUNTER — Other Ambulatory Visit: Payer: Self-pay | Admitting: Family Medicine

## 2012-01-04 ENCOUNTER — Other Ambulatory Visit: Payer: Self-pay | Admitting: Family Medicine

## 2012-01-06 NOTE — Telephone Encounter (Signed)
OK for #30 no rf?

## 2012-01-09 ENCOUNTER — Other Ambulatory Visit: Payer: Self-pay | Admitting: Family Medicine

## 2012-01-11 ENCOUNTER — Other Ambulatory Visit: Payer: Self-pay | Admitting: Family Medicine

## 2012-01-15 ENCOUNTER — Other Ambulatory Visit: Payer: Self-pay | Admitting: Family Medicine

## 2012-01-16 ENCOUNTER — Other Ambulatory Visit: Payer: Self-pay | Admitting: Family Medicine

## 2012-01-19 ENCOUNTER — Other Ambulatory Visit: Payer: Self-pay | Admitting: Family Medicine

## 2012-01-24 ENCOUNTER — Other Ambulatory Visit: Payer: Self-pay | Admitting: Family Medicine

## 2012-01-25 ENCOUNTER — Other Ambulatory Visit: Payer: Self-pay | Admitting: Family Medicine

## 2012-01-26 NOTE — Telephone Encounter (Signed)
Please call patient.  It appears she is needing this medication more than we anticipated.  Please advise her to RTC to discuss evaluation and treatment of her pain.

## 2012-02-05 NOTE — Telephone Encounter (Signed)
LMOM advising patient info below. 

## 2012-03-28 ENCOUNTER — Ambulatory Visit (INDEPENDENT_AMBULATORY_CARE_PROVIDER_SITE_OTHER): Payer: 59 | Admitting: Family Medicine

## 2012-03-28 VITALS — BP 133/76 | HR 84 | Temp 98.0°F | Resp 18 | Ht 65.0 in | Wt 181.0 lb

## 2012-03-28 DIAGNOSIS — J0100 Acute maxillary sinusitis, unspecified: Secondary | ICD-10-CM

## 2012-03-28 DIAGNOSIS — Z720 Tobacco use: Secondary | ICD-10-CM

## 2012-03-28 DIAGNOSIS — F17200 Nicotine dependence, unspecified, uncomplicated: Secondary | ICD-10-CM

## 2012-03-28 MED ORDER — BUPROPION HCL ER (XL) 150 MG PO TB24: 150.0000 mg | ORAL_TABLET | Freq: Every day | ORAL | Status: DC

## 2012-03-28 MED ORDER — METHYLPREDNISOLONE 4 MG PO KIT: PACK | ORAL | Status: AC

## 2012-03-28 MED ORDER — AZITHROMYCIN 250 MG PO TABS: ORAL_TABLET | ORAL | Status: DC

## 2012-03-28 MED ORDER — HYDROCOD POLST-CHLORPHEN POLST 10-8 MG/5ML PO LQCR: 5.0000 mL | Freq: Two times a day (BID) | ORAL | Status: DC | PRN

## 2012-03-28 NOTE — Progress Notes (Signed)
  Subjective:    Patient ID: Laurie Chang, female    DOB: 1966/05/20, 46 y.o.   MRN: [id number]  HPI presents today with cough, productive/dark yellow, ear congestion, nasal congestion, and HA. Started Friday, has been having fever and chills, fever 100.1. She has been around grand children who were sick the beginning of last week. Cough is keeping her awake at night.  Is a current smoker, 1/2 ppd. Has tried patches in the past but didn't work and scared about chantix    Review of Systems  Constitutional: Positive for fever, chills, activity change and fatigue.  HENT: Positive for congestion. Negative for ear pain, neck pain, neck stiffness and ear discharge.   Eyes: Negative for discharge.  Respiratory: Positive for cough and chest tightness. Negative for shortness of breath and wheezing.   Cardiovascular: Positive for chest pain.  Gastrointestinal: Negative.   Musculoskeletal: Negative for arthralgias.  Neurological: Positive for headaches.  Psychiatric/Behavioral: Negative for behavioral problems, dysphoric mood and agitation.       Objective:   Physical Exam  Constitutional: She is oriented to person, place, and time. She appears well-developed and well-nourished. No distress.  HENT:  Head: Normocephalic and atraumatic.  Right Ear: External ear normal. Tympanic membrane is injected and retracted. A middle ear effusion is present.  Left Ear: External ear normal. Tympanic membrane is injected and retracted. A middle ear effusion is present.  Nose: Mucosal edema and rhinorrhea present.  Mouth/Throat: Uvula is midline. Mucous membranes are not pale and not dry. Posterior oropharyngeal erythema present. No oropharyngeal exudate.  Eyes: Conjunctivae are normal. Pupils are equal, round, and reactive to light. Right eye exhibits no discharge. Left eye exhibits no discharge. No scleral icterus.  Neck: Normal range of motion. Neck supple. No thyromegaly present.  Cardiovascular: Normal  rate, regular rhythm and normal heart sounds.   Pulmonary/Chest: Effort normal and breath sounds normal. No respiratory distress. She has no wheezes. She has no rales. She exhibits tenderness.  Lymphadenopathy:    She has cervical adenopathy.       Right cervical: Superficial cervical adenopathy present.       Left cervical: Superficial cervical adenopathy present.  Neurological: She is alert and oriented to person, place, and time.  Skin: Skin is warm and dry. She is not diaphoretic.  Psychiatric: She has a normal mood and affect. Her behavior is normal.          Assessment & Plan:  Sinusitis - trx w/ oral steroid and z-pack. Prn tussionex for cough tob abuse - reviewed various cessation options and reasons. Pt ready and would like to try zyban - reviewed all risks/benefits, rtc if not successful.

## 2012-03-28 NOTE — Patient Instructions (Signed)

## 2012-03-29 NOTE — Progress Notes (Signed)
Reviewed and agree.

## 2012-04-21 ENCOUNTER — Ambulatory Visit (INDEPENDENT_AMBULATORY_CARE_PROVIDER_SITE_OTHER): Payer: 59 | Admitting: Family Medicine

## 2012-04-21 VITALS — BP 128/80 | HR 106 | Temp 98.5°F | Resp 16 | Ht 66.0 in | Wt 180.0 lb

## 2012-04-21 DIAGNOSIS — R197 Diarrhea, unspecified: Secondary | ICD-10-CM

## 2012-04-21 DIAGNOSIS — R1110 Vomiting, unspecified: Secondary | ICD-10-CM

## 2012-04-21 DIAGNOSIS — IMO0002 Reserved for concepts with insufficient information to code with codable children: Secondary | ICD-10-CM

## 2012-04-21 DIAGNOSIS — R110 Nausea: Secondary | ICD-10-CM

## 2012-04-21 DIAGNOSIS — M1710 Unilateral primary osteoarthritis, unspecified knee: Secondary | ICD-10-CM

## 2012-04-21 DIAGNOSIS — M170 Bilateral primary osteoarthritis of knee: Secondary | ICD-10-CM

## 2012-04-21 DIAGNOSIS — M25569 Pain in unspecified knee: Secondary | ICD-10-CM

## 2012-04-21 MED ORDER — TRAMADOL HCL 50 MG PO TABS: 50.0000 mg | ORAL_TABLET | Freq: Three times a day (TID) | ORAL | Status: DC | PRN

## 2012-04-21 MED ORDER — ONDANSETRON HCL 8 MG PO TABS: 8.0000 mg | ORAL_TABLET | Freq: Three times a day (TID) | ORAL | Status: DC | PRN

## 2012-04-21 NOTE — Progress Notes (Signed)
[facility] [address], [place] [address] [phone]  Date:  04/21/2012   Name:  Laurie Chang   DOB:  [birth year]   MRN:  [id number]  PCP:  No primary provider on file.    Chief Complaint: Emesis, Diarrhea, Nausea and Medication Refill   History of Present Illness:  Laurie Chang is a 46 y.o. 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 She had a temp up to 101.  All Wednesday night and yesterday am she was ill.  Yesterday the vomiting resolved, but she continued to have diarrhea. The fever resolved yesterday.  She is now a little bit nauseated, but her diarrhea is quiet.  She is here to get a doctor's note for her job so she can RTW on Monday.   No blood in vomit or feces.  She ate soup and crackers yesterday.  Nothing so far today but she is tolerating liquids/ water.   She is using tramadol some nights for her knee pain- she uses it when she needs it only.  It helps her to sleep much better.  She does not use it during the day due to sedation.  She would like to have some more of this medication if possible.   She has lost about 20 lbs over the last year- however she has kind of hit a plateau.  Laurie Chang would like to lose about 10 more pounds if possible  Patient Active Problem List  Diagnosis  . Microhematuria    Past Medical History  Diagnosis Date  . Smoker     1/2 PPD  . Leiomyoma uteri     Past Surgical History  Procedure Date  . Tubal ligation   . Laparoscopic cholecystectomy   . Vein surgery   . Hysteroscopy 11/2000    HYSTEROSCOPIC MYOMECTOMY  . Abdominal hysterectomy 12/11/10    TAH,RSO  . Oophorectomy 12/11/10    TAH,RSO    History  Substance Use Topics  . Smoking status: Current Every Day Smoker  . Smokeless tobacco: Not on file  . Alcohol Use: No     Family History  Problem Relation Age of Onset  . Hypertension Father   . Hypertension Brother     No Known Allergies  Medication list has been reviewed and updated.  Current Outpatient Prescriptions on File Prior to Visit  Medication Sig Dispense Refill  . CALCIUM PO Take by mouth.        . Multiple Vitamins-Iron (MULTIVITAMIN/IRON PO) Take by mouth.        . traMADol (ULTRAM) 50 MG tablet TAKE 1 TABLET (50 MG TOTAL) BY MOUTH EVERY 8 (EIGHT) HOURS AS NEEDED FOR PAIN.  30 tablet  0  . azithromycin (ZITHROMAX Z-PAK) 250 MG tablet Take 2 tabs po x 1, then 1 tab po qd d2-5.  6 each  0  . buPROPion (WELLBUTRIN XL) 150 MG 24 hr tablet Take 1 tablet (150 mg total) by mouth daily.  30 tablet  5  . chlorpheniramine-HYDROcodone (TUSSIONEX PENNKINETIC ER) 10-8 MG/5ML LQCR Take 5 mLs by mouth every 12 (twelve) hours as needed (cough).  140 mL  0    Review of Systems:  As per HPI- otherwise negative.   Physical Examination: Filed Vitals:   04/21/12 1115  BP: 128/80  Pulse: 106  Temp:  98.5 F (36.9 C)  Resp: 16   Filed Vitals:   04/21/12 1115  Height: 5\' 6"  (1.676 m)  Weight: 180 lb (81.647 kg)   Body mass index is 29.05 kg/(m^2). Ideal Body Weight: Weight in (lb) to have BMI = 25: 154.6   GEN: WDWN, NAD, Non-toxic, A & O x 3, overweight HEENT: Atraumatic, Normocephalic. Neck supple. No masses, No LAD. Ears and Nose: No external deformity. CV: RRR, No M/G/R. No JVD. No thrill. No extra heart sounds. PULM: CTA B, no wheezes, crackles, rhonchi. No retractions. No resp. distress. No accessory muscle use. ABD: S,  ND, +BS. No rebound. No HSM.  Mild tenderness across her entire lower abdomen.  She states this is much better than it was the last couple of days EXTR: No c/c/e.  Crepitus both knees.  Normal ROM and strength of her quads and hamstrings NEURO Normal gait.  PSYCH: Normally interactive. Conversant. Not depressed or anxious appearing.  Calm demeanor.   Given 8mg  of  zofran while in clinic.    Assessment and Plan: 1. Vomiting  ondansetron (ZOFRAN) 8 MG tablet  2. Nausea    3. Diarrhea    4. Knee pain    5. Osteoarthritis of both knees  traMADol (ULTRAM) 50 MG tablet   Laurie Chang has likely had a viral gastroenteritis.  She is now doing much better.  She will stick with a bland diet and push fluids.  Let me know if not continuing to get better.  Ok to use some tramadol for her knee pain. She is using it appropriately, and will give her a refill today.  Noted for her job- return on Monday.    Went over some tips for further weight loss.  She needs to increase her exercise and add some weight training.  She will work on these things.  Her goal is 170 lbs, which is reasonable.    Laurie Amsterdam, MD

## 2012-08-08 ENCOUNTER — Ambulatory Visit: Payer: 59

## 2012-08-08 ENCOUNTER — Ambulatory Visit (INDEPENDENT_AMBULATORY_CARE_PROVIDER_SITE_OTHER): Payer: 59 | Admitting: Family Medicine

## 2012-08-08 VITALS — BP 144/87 | HR 89 | Temp 98.5°F | Resp 17 | Ht 66.0 in | Wt 188.0 lb

## 2012-08-08 DIAGNOSIS — M79671 Pain in right foot: Secondary | ICD-10-CM

## 2012-08-08 DIAGNOSIS — IMO0002 Reserved for concepts with insufficient information to code with codable children: Secondary | ICD-10-CM

## 2012-08-08 DIAGNOSIS — M1710 Unilateral primary osteoarthritis, unspecified knee: Secondary | ICD-10-CM

## 2012-08-08 DIAGNOSIS — M79609 Pain in unspecified limb: Secondary | ICD-10-CM

## 2012-08-08 DIAGNOSIS — M170 Bilateral primary osteoarthritis of knee: Secondary | ICD-10-CM

## 2012-08-08 MED ORDER — MELOXICAM 15 MG PO TABS: 15.0000 mg | ORAL_TABLET | Freq: Every day | ORAL | Status: DC

## 2012-08-08 MED ORDER — TRAMADOL HCL 50 MG PO TABS: 100.0000 mg | ORAL_TABLET | Freq: Every evening | ORAL | Status: DC | PRN

## 2012-08-08 NOTE — Patient Instructions (Signed)
Wear the CAM walker whenever you are up and about.  If your pain persists after 2 weeks, or worsens in the meantime, please return for re-evaluation.

## 2012-08-08 NOTE — Progress Notes (Signed)
Subjective:    Patient ID: Laurie Chang, female    DOB: 1966/04/26, 47 y.o.   MRN: [id number]  HPI This 47 y.o. female presents for evaluation of RIGHT foot pain.  Pain began 3 weeks ago without trauma or injury.  Takes tramadol at bedtime for knee pain due to arthritis, but has to take 2 to ease the foot pain.  She tried changing shoes, without benefit, and has pain when she's barefoot as well.  Sometimes the pain "stops me" it gets so severe, a shooting pain that starts in the top of the foot and extends up the dorsal aspect of the ankle.   Past Medical History  Diagnosis Date  . Smoker     1/2 PPD  . Leiomyoma uteri     Past Surgical History  Procedure Date  . Tubal ligation   . Laparoscopic cholecystectomy   . Vein surgery   . Hysteroscopy 11/2000    HYSTEROSCOPIC MYOMECTOMY  . Abdominal hysterectomy 12/11/10    TAH,RSO  . Oophorectomy 12/11/10    TAH,RSO    Prior to Admission medications   Medication Sig Start Date End Date Taking? Authorizing Provider  buPROPion (WELLBUTRIN XL) 150 MG 24 hr tablet Take 1 tablet (150 mg total) by mouth daily. 03/28/12 03/28/13 Yes Sherren Mocha, MD  CALCIUM PO Take by mouth.     Yes Historical Provider, MD  Multiple Vitamins-Iron (MULTIVITAMIN/IRON PO) Take by mouth.     Yes Historical Provider, MD  ondansetron (ZOFRAN) 8 MG tablet Take 1 tablet (8 mg total) by mouth every 8 (eight) hours as needed for nausea. 04/21/12  Yes Gwenlyn Found Copland, MD  traMADol (ULTRAM) 50 MG tablet Take 1 tablet (50 mg total) by mouth every 8 (eight) hours as needed for pain. 04/21/12  Yes Pearline Cables, MD    No Known Allergies  History   Social History  . Marital Status: Widowed    Spouse Name: N/A    Number of Children: 3  . Years of Education: 12   Occupational History  . Med [employer]     [facility]   Social History Main Topics  . Smoking status: Current Every Day Smoker -- 1.0 packs/day for 28 years    Types: Cigarettes  .  Smokeless tobacco: Never Used  . Alcohol Use: 0.0 - 0.5 oz/week    0-1 drink(s) per week     Comment: once in a blue moon  . Drug Use: No  . Sexually Active: Yes -- Female partner(s)    Birth Control/ Protection: Surgical   Other Topics Concern  . Not on file   Social History Narrative   Widowed in 2012.  Lives with her daughter and two granddaughters.    Family History  Problem Relation Age of Onset  . Hypertension Father   . Heart disease Father   . Hypertension Brother   . Cancer Mother   . Cancer Maternal Grandmother    Review of Systems As above.    Objective:   Physical Exam  Blood pressure 144/87, pulse 89, temperature 98.5 F (36.9 C), temperature source Oral, resp. rate 17, height 5\' 6"  (1.676 m), weight 188 lb (85.276 kg), last menstrual period 08/19/2010, SpO2 98.00%. Body mass index is 30.34 kg/(m^2). Well-developed, well nourished WF who is awake, alert and oriented, in NAD. HEENT: [place]/AT, sclera and conjunctiva are clear.  Heart: regular rate. Lungs: normal effort Extremities: no cyanosis, clubbing or edema. No lesions.  The dorsum of the  RIGHT foot is tender on palpation.  FROM, though pain with plantar and dorsiflexion, and eversion of the foot. Skin: warm and dry without rash. Psychologic: good mood and appropriate affect, normal speech and behavior.  RIGHT foot: [facility] reading (PRIMARY) by  Dr. Alwyn Ren.  Normal foot.  No fracture or other abnormality.     Assessment & Plan:   1. Foot pain, right  DG Foot Complete Right, traMADol (ULTRAM) 50 MG tablet, meloxicam (MOBIC) 15 MG tablet  2. Osteoarthritis of both knees  traMADol (ULTRAM) 50 MG tablet   CAM walker in hopes of reducing the strain on her RIGHT foot, which I suspect is due to altered gait from LEFT knee arthritis.  She may continue to take 100 mg Tramadol at HS and add meloxicam QAM (she has taken and tolerated it previously).  If symptoms worsen or persist, RTC for re-evaluation and likely referral  to orthopedics.  Discussed with Dr. Alwyn Ren.  Tall CAM walker fit and trained for right foot by E. Cardwell, CMA.

## 2012-08-08 NOTE — Progress Notes (Signed)
Case was discussed with Laurie Chang [facility] and x-rays were carefully viewed. No fracture is noted. Treatment plan was discussed, and agree with use of the Cam Walker. Return if worse.

## 2012-12-06 ENCOUNTER — Ambulatory Visit: Payer: 59 | Admitting: Family Medicine

## 2012-12-06 VITALS — BP 124/88 | HR 78 | Temp 97.9°F | Resp 16 | Ht 65.75 in | Wt 186.4 lb

## 2012-12-06 DIAGNOSIS — R1032 Left lower quadrant pain: Secondary | ICD-10-CM

## 2012-12-06 DIAGNOSIS — K5732 Diverticulitis of large intestine without perforation or abscess without bleeding: Secondary | ICD-10-CM

## 2012-12-06 DIAGNOSIS — R768 Other specified abnormal immunological findings in serum: Secondary | ICD-10-CM | POA: Insufficient documentation

## 2012-12-06 LAB — POCT CBC
Granulocyte percent: 54.2 %G (ref 37–80)
HCT, POC: 44.4 % (ref 37.7–47.9)
Lymph, poc: 3.3 (ref 0.6–3.4)
MCHC: 31.8 g/dL (ref 31.8–35.4)
MCV: 97.7 fL — AB (ref 80–97)
MID (cbc): 0.5 (ref 0–0.9)
POC Granulocyte: 4.6 (ref 2–6.9)
POC LYMPH PERCENT: 39.4 %L (ref 10–50)
Platelet Count, POC: 350 10*3/uL (ref 142–424)
RDW, POC: 3.6 %

## 2012-12-06 LAB — POCT URINALYSIS DIPSTICK
Ketones, UA: NEGATIVE
Protein, UA: NEGATIVE
Spec Grav, UA: 1.01
Urobilinogen, UA: 0.2
pH, UA: 6

## 2012-12-06 LAB — POCT UA - MICROSCOPIC ONLY
Casts, Ur, LPF, POC: NEGATIVE
Crystals, Ur, HPF, POC: NEGATIVE
Epithelial cells, urine per micros: NEGATIVE
Mucus, UA: NEGATIVE
Yeast, UA: NEGATIVE

## 2012-12-06 LAB — LIPASE: Lipase: 17 U/L (ref 0–75)

## 2012-12-06 LAB — COMPREHENSIVE METABOLIC PANEL
ALT: 13 U/L (ref 0–35)
AST: 14 U/L (ref 0–37)
Alkaline Phosphatase: 49 U/L (ref 39–117)
Creat: 0.79 mg/dL (ref 0.50–1.10)
Sodium: 138 mEq/L (ref 135–145)
Total Bilirubin: 0.4 mg/dL (ref 0.3–1.2)

## 2012-12-06 LAB — AMYLASE: Amylase: 58 U/L (ref 0–105)

## 2012-12-06 MED ORDER — METRONIDAZOLE 500 MG PO TABS: 500.0000 mg | ORAL_TABLET | Freq: Four times a day (QID) | ORAL | Status: DC

## 2012-12-06 MED ORDER — ACYCLOVIR 400 MG PO TABS: 400.0000 mg | ORAL_TABLET | Freq: Two times a day (BID) | ORAL | Status: DC

## 2012-12-06 MED ORDER — SULFAMETHOXAZOLE-TRIMETHOPRIM 800-160 MG PO TABS: 1.0000 | ORAL_TABLET | Freq: Two times a day (BID) | ORAL | Status: DC

## 2012-12-06 NOTE — Patient Instructions (Addendum)
We are going to treat you for mild diverticulitis with septra and flagyl antibiotics.  Take them according to the directions.  Stick with clear fluids for the next couple of days if you do not feel like eating.  If you are getting worse or are not better in the next 1 or 2 days please come in for a recheck.  If you start to get worse we need to do a CT scan, and you should seek care right away.  Call if any questions or concerns.

## 2012-12-06 NOTE — Progress Notes (Addendum)
[facility] [address], [place] [address] [phone]  Date:  12/06/2012   Name:  Laurie Chang   DOB:  Nov 24, 1965   MRN:  [id number]  PCP:  Abbe Amsterdam, MD    Chief Complaint: Abdominal Pain   History of Present Illness:  Laurie Chang is a 47 y.o. very pleasant female patient who presents with the following:  She is here today with a sharp pain in her left lower abdomen for the last 2 days.   She has noted some diarrhea and worse pain with eating.  She has had stools 4 or 5 times a day over the last 2 days.  No vomiting, but she is nauseated.   She has not noted a fever.   She has not been eating much because eating makes the pain worse.  She has been drinking liquids.    She had had a cholecystectomy and a partial hysterectomy (still has her left ovary).   She has not noted any urinary symptoms or vaginal symptoms.   (Performed CT abdomen and pelvis 11/2010 to r/o appendicitis.  Negative for appendicitis, but she was noted to have a left adnexal mass and fibroids.  She reports she did have the ultrasound recommended on CT report by her OBG, and then had her partial hysterectomy.)  She has not yet had a colonoscopy.    Patient Active Problem List   Diagnosis Date Noted  . Microhematuria 11/22/2011    Past Medical History  Diagnosis Date  . Smoker     1/2 PPD  . Leiomyoma uteri   . Arthritis     Past Surgical History  Procedure Laterality Date  . Tubal ligation    . Laparoscopic cholecystectomy    . Vein surgery    . Hysteroscopy  11/2000    HYSTEROSCOPIC MYOMECTOMY  . Abdominal hysterectomy  12/11/10    TAH,RSO  . Oophorectomy  12/11/10    TAH,RSO    History  Substance Use Topics  . Smoking status: Current Every Day Smoker -- 1.00 packs/day for 28 years    Types: Cigarettes  . Smokeless tobacco: Never Used  . Alcohol Use: 0 - .5 oz/week    0-1 drink(s) per week     Comment: once in a blue moon    Family History   Problem Relation Age of Onset  . Hypertension Father   . Heart disease Father   . Hypertension Brother   . Cancer Mother   . Cancer Maternal Grandmother     No Known Allergies  Medication list has been reviewed and updated.  Current Outpatient Prescriptions on File Prior to Visit  Medication Sig Dispense Refill  . CALCIUM PO Take by mouth.        . Multiple Vitamins-Iron (MULTIVITAMIN/IRON PO) Take by mouth.        . traMADol (ULTRAM) 50 MG tablet Take 2 tablets (100 mg total) by mouth at bedtime as needed for pain.  60 tablet  3  . buPROPion (WELLBUTRIN XL) 150 MG 24 hr tablet Take 1 tablet (150 mg total) by mouth daily.  30 tablet  5  . meloxicam (MOBIC) 15 MG tablet Take 1 tablet (15 mg total) by mouth daily.  30 tablet  1  . ondansetron (ZOFRAN) 8 MG tablet Take 1 tablet (8 mg total) by mouth every 8 (eight) hours as needed for nausea.  15 tablet  0   No current facility-administered medications on file prior to visit.  Review of Systems:  As per HPI- otherwise negative.   Physical Examination: Filed Vitals:   12/06/12 1056  BP: 124/88  Pulse: 78  Temp: 97.9 F (36.6 C)  Resp: 16   Filed Vitals:   12/06/12 1056  Height: 5' 5.75" (1.67 m)  Weight: 186 lb 6.4 oz (84.55 kg)   Body mass index is 30.32 kg/(m^2). Ideal Body Weight: Weight in (lb) to have BMI = 25: 153.4  GEN: WDWN, NAD, Non-toxic, A & O x 3, looks well HEENT: Atraumatic, Normocephalic. Neck supple. No masses, No LAD. Ears and Nose: No external deformity. CV: RRR, No M/G/R. No JVD. No thrill. No extra heart sounds. PULM: CTA B, no wheezes, crackles, rhonchi. No retractions. No resp. distress. No accessory muscle use. ABD: S, ND, +BS. No rebound. No HSM.  Minimal to no tenderness in the LLQ.  No other abdominal tenderness. Negative heel tap and table jar test.    EXTR: No c/c/e NEURO Normal gait.  PSYCH: Normally interactive. Conversant. Not depressed or anxious appearing.  Calm demeanor.     Results for orders placed in visit on 12/06/12  POCT CBC      Result Value Range   WBC 8.4  4.6 - 10.2 K/uL   Lymph, poc 3.3  0.6 - 3.4   POC LYMPH PERCENT 39.4  10 - 50 %L   MID (cbc) 0.5  0 - 0.9   POC MID % 6.4  0 - 12 %M   POC Granulocyte 4.6  2 - 6.9   Granulocyte percent 54.2  37 - 80 %G   RBC 4.54  4.04 - 5.48 M/uL   Hemoglobin 14.1  12.2 - 16.2 g/dL   HCT, POC 16.1  09.6 - 47.9 %   MCV 97.7 (*) 80 - 97 fL   MCH, POC 31.1  27 - 31.2 pg   MCHC 31.8  31.8 - 35.4 g/dL   RDW, POC 3.6     Platelet Count, POC 350  142 - 424 K/uL   MPV 8.0  0 - 99.8 fL  POCT URINALYSIS DIPSTICK      Result Value Range   Color, UA yellow     Clarity, UA clear     Glucose, UA neg     Bilirubin, UA neg     Ketones, UA neg     Spec Grav, UA 1.010     Blood, UA large     pH, UA 6.0     Protein, UA neg     Urobilinogen, UA 0.2     Nitrite, UA neg     Leukocytes, UA Negative    POCT UA - MICROSCOPIC ONLY      Result Value Range   WBC, Ur, HPF, POC 0-1     RBC, urine, microscopic 2-4     Bacteria, U Microscopic neg     Mucus, UA neg     Epithelial cells, urine per micros neg     Crystals, Ur, HPF, POC neg     Casts, Ur, LPF, POC neg     Yeast, UA neg      Assessment and Plan: HSV-2 seropositive - Plan: acyclovir (ZOVIRAX) 400 MG tablet  Abdominal pain, left lower quadrant - Plan: POCT CBC, POCT urinalysis dipstick, POCT UA - Microscopic Only, Comprehensive metabolic panel, Amylase, Lipase  Diverticulitis of colon (without mention of hemorrhage) - Plan: sulfamethoxazole-trimethoprim (BACTRIM DS,SEPTRA DS) 800-160 MG per tablet, metroNIDAZOLE (FLAGYL) 500 MG tablet  LLQ discomfort for about 2  days with minimal tenderness on exam today.  Suspect mild diverticulitis.  Discussed doing a CT to confirm this diagnosis.  However, Laurie Chang is justifiably concerned about a repeat scan as she had a CT abdomen/ pelvis 2 years ago.  She would like to try treatment with oral antibiotics with close  follow- up.  Given her normal VS, lack of fever and normal WBC count this is a reasonable plan.  Treat with septra and flagyl. Instructed not to use ultram with flagyl   She would like a refill of her acyclovir which she takes for suppressive therapy of HSV 2, noted on screening labs  History of microhematuria with negative evaluation per urology in the past.    Requested a copy of her OBG records today   Signed Abbe Amsterdam, MD  5/22- called at 8:20 am to check on her today but could not get through.  Her CMP and amylase/ lipase are normal.  Will send message to clinical pool to keep trying.  Called again around 3:40 pm.  Could not get through still

## 2012-12-07 ENCOUNTER — Telehealth: Payer: Self-pay

## 2012-12-07 NOTE — Telephone Encounter (Signed)
Was able to get through to her VM and left a detailed message.  Labs looked ok, but if she is not better please call or come in for a recheck.

## 2012-12-07 NOTE — Telephone Encounter (Signed)
Pt is calling about lab results Call back number is [phone]

## 2012-12-09 ENCOUNTER — Telehealth: Payer: Self-pay | Admitting: Family Medicine

## 2012-12-09 NOTE — Telephone Encounter (Signed)
Called and spoke with her- she is feeling better, pain resolved about 24 hours after she started the abx.  I did receive her OBG records, and she did indeed have the pelvic ultrasound that was recommended per her OB.

## 2012-12-13 ENCOUNTER — Other Ambulatory Visit: Payer: Self-pay | Admitting: Family Medicine

## 2013-02-09 ENCOUNTER — Other Ambulatory Visit: Payer: Self-pay

## 2013-02-09 DIAGNOSIS — Z1231 Encounter for screening mammogram for malignant neoplasm of breast: Secondary | ICD-10-CM

## 2013-03-01 ENCOUNTER — Ambulatory Visit
Admission: RE | Admit: 2013-03-01 | Discharge: 2013-03-01 | Disposition: A | Discharge status: Home / Self Care | Payer: 59 | Source: Ambulatory Visit

## 2013-03-01 DIAGNOSIS — Z1231 Encounter for screening mammogram for malignant neoplasm of breast: Secondary | ICD-10-CM

## 2013-03-14 ENCOUNTER — Encounter: Payer: Self-pay | Admitting: Gynecology

## 2013-03-14 ENCOUNTER — Ambulatory Visit (INDEPENDENT_AMBULATORY_CARE_PROVIDER_SITE_OTHER): Payer: 59 | Admitting: Gynecology

## 2013-03-14 VITALS — BP 138/90 | Ht 65.0 in | Wt 186.0 lb

## 2013-03-14 DIAGNOSIS — F17200 Nicotine dependence, unspecified, uncomplicated: Secondary | ICD-10-CM

## 2013-03-14 DIAGNOSIS — R635 Abnormal weight gain: Secondary | ICD-10-CM

## 2013-03-14 DIAGNOSIS — Z1159 Encounter for screening for other viral diseases: Secondary | ICD-10-CM

## 2013-03-14 DIAGNOSIS — Z01419 Encounter for gynecological examination (general) (routine) without abnormal findings: Secondary | ICD-10-CM

## 2013-03-14 DIAGNOSIS — F17290 Nicotine dependence, other tobacco product, uncomplicated: Secondary | ICD-10-CM

## 2013-03-14 MED ORDER — VARENICLINE TARTRATE 0.5 MG PO TABS: ORAL_TABLET | ORAL | Status: DC

## 2013-03-14 NOTE — Patient Instructions (Signed)
Varenicline oral tablets What is this medicine? VARENICLINE (var EN i kleen) is used to help people quit smoking. It can reduce the symptoms caused by stopping smoking. It is used with a patient support program recommended by your physician. This medicine may be used for other purposes; ask your health care provider or pharmacist if you have questions. What should I tell my health care provider before I take this medicine? They need to know if you have any of these conditions: -bipolar disorder, depression, schizophrenia or other mental illness -heart disease -kidney disease -peripheral vascular diseaseSmoking Cessation Quitting smoking is important to your health and has many advantages. However, it is not always easy to quit since nicotine is a very addictive drug. Often times, people try 3 times or more before being able to quit. This document explains the best ways for you to prepare to quit smoking. Quitting takes hard work and a lot of effort, but you can do it. ADVANTAGES OF QUITTING SMOKING  You will live longer, feel better, and live better.  Your body will feel the impact of quitting smoking almost immediately.  Within 20 minutes, blood pressure decreases. Your pulse returns to its normal level.  After 8 hours, carbon monoxide levels in the blood return to normal. Your oxygen level increases.  After 24 hours, the chance of having a heart attack starts to decrease. Your breath, hair, and body stop smelling like smoke.  After 48 hours, damaged nerve endings begin to recover. Your sense of taste and smell improve.  After 72 hours, the body is virtually free of nicotine. Your bronchial tubes relax and breathing becomes easier.  After 2 to 12 weeks, lungs can hold more air. Exercise becomes easier and circulation improves.  The risk of having a heart attack, stroke, cancer, or lung disease is greatly reduced.  After 1 year, the risk of coronary heart disease is cut in  half.  After 5 years, the risk of stroke falls to the same as a nonsmoker.  After 10 years, the risk of lung cancer is cut in half and the risk of other cancers decreases significantly.  After 15 years, the risk of coronary heart disease drops, usually to the level of a nonsmoker.  If you are pregnant, quitting smoking will improve your chances of having a healthy baby.  The people you live with, especially any children, will be healthier.  You will have extra money to spend on things other than cigarettes. QUESTIONS TO THINK ABOUT BEFORE ATTEMPTING TO QUIT You may want to talk about your answers with your caregiver.  Why do you want to quit?  If you tried to quit in the past, what helped and what did not?  What will be the most difficult situations for you after you quit? How will you plan to handle them?  Who can help you through the tough times? Your family? Friends? A caregiver?  What pleasures do you get from smoking? What ways can you still get pleasure if you quit? Here are some questions to ask your caregiver:  How can you help me to be successful at quitting?  What medicine do you think would be best for me and how should I take it?  What should I do if I need more help?  What is smoking withdrawal like? How can I get information on withdrawal? GET READY  Set a quit date.  Change your environment by getting rid of all cigarettes, ashtrays, matches, and lighters in your home,  car, or work. Do not let people smoke in your home.  Review your past attempts to quit. Think about what worked and what did not. GET SUPPORT AND ENCOURAGEMENT You have a better chance of being successful if you have help. You can get support in many ways.  Tell your family, friends, and co-workers that you are going to quit and need their support. Ask them not to smoke around you.  Get individual, group, or telephone counseling and support. Programs are available at [employer] and  health centers. Call your local health department for information about programs in your area.  Spiritual beliefs and practices may help some smokers quit.  Download a "quit meter" on your computer to keep track of quit statistics, such as how long you have gone without smoking, cigarettes not smoked, and money saved.  Get a self-help book about quitting smoking and staying off of tobacco. LEARN NEW SKILLS AND BEHAVIORS  Distract yourself from urges to smoke. Talk to someone, go for a walk, or occupy your time with a task.  Change your normal routine. Take a different route to work. Drink tea instead of coffee. Eat breakfast in a different place.  Reduce your stress. Take a hot bath, exercise, or read a book.  Plan something enjoyable to do every day. Reward yourself for not smoking.  Explore interactive web-based programs that specialize in helping you quit. GET MEDICINE AND USE IT CORRECTLY Medicines can help you stop smoking and decrease the urge to smoke. Combining medicine with the above behavioral methods and support can greatly increase your chances of successfully quitting smoking.  Nicotine replacement therapy helps deliver nicotine to your body without the negative effects and risks of smoking. Nicotine replacement therapy includes nicotine gum, lozenges, inhalers, nasal sprays, and skin patches. Some may be available over-the-counter and others require a prescription.  Antidepressant medicine helps people abstain from smoking, but how this works is unknown. This medicine is available by prescription.  Nicotinic receptor partial agonist medicine simulates the effect of nicotine in your brain. This medicine is available by prescription. Ask your caregiver for advice about which medicines to use and how to use them based on your health history. Your caregiver will tell you what side effects to look out for if you choose to be on a medicine or therapy. Carefully read the information  on the package. Do not use any other product containing nicotine while using a nicotine replacement product.  RELAPSE OR DIFFICULT SITUATIONS Most relapses occur within the first 3 months after quitting. Do not be discouraged if you start smoking again. Remember, most people try several times before finally quitting. You may have symptoms of withdrawal because your body is used to nicotine. You may crave cigarettes, be irritable, feel very hungry, cough often, get headaches, or have difficulty concentrating. The withdrawal symptoms are only temporary. They are strongest when you first quit, but they will go away within 10 14 days. To reduce the chances of relapse, try to:  Avoid drinking alcohol. Drinking lowers your chances of successfully quitting.  Reduce the amount of caffeine you consume. Once you quit smoking, the amount of caffeine in your body increases and can give you symptoms, such as a rapid heartbeat, sweating, and anxiety.  Avoid smokers because they can make you want to smoke.  Do not let weight gain distract you. Many smokers will gain weight when they quit, usually less than 10 pounds. Eat a healthy diet and stay active. You can  always lose the weight gained after you quit.  Find ways to improve your mood other than smoking. FOR MORE INFORMATION  www.smokefree.gov  Document Released: 06/29/2001 Document Revised: 01/04/2012 Document Reviewed: 10/14/2011 Exit[employer] Patient Information 2014 [place], [place].  -stroke -suicidal thoughts, plans, or attempt; a previous suicide attempt by you or a family member -an unusual or allergic reaction to varenicline, other medicines, foods, dyes, or preservatives -pregnant or trying to get pregnant -breast-feeding How should I use this medicine? You should set a date to stop smoking and tell your doctor. Start this medicine one week before the quit date. You can also start taking this medicine before you choose a quit date, and then pick a  quit date that is between 8 and 35 days of treatment with this medicine. Stick to your plan; ask about support groups or other ways to help you remain a 'quitter'. Take this medicine by mouth after eating. Take with a full glass of water. Follow the directions on the prescription label. Take your doses at regular intervals. Do not take your medicine more often than directed. A special MedGuide will be given to you by the pharmacist with each prescription and refill. Be sure to read this information carefully each time. Talk to your pediatrician regarding the use of this medicine in children. This medicine is not approved for use in children. Overdosage: If you think you have taken too much of this medicine contact a poison control center or emergency room at once. NOTE: This medicine is only for you. Do not share this medicine with others. What if I miss a dose? If you miss a dose, take it as soon as you can. If it is almost time for your next dose, take only that dose. Do not take double or extra doses. What may interact with this medicine? -insulin -other stop smoking aids -theophylline -warfarin This list may not describe all possible interactions. Give your health care provider a list of all the medicines, herbs, non-prescription drugs, or dietary supplements you use. Also tell them if you smoke, drink alcohol, or use illegal drugs. Some items may interact with your medicine. What should I watch for while using this medicine? Visit your doctor or health care professional for regular check ups. Ask for ongoing advice and encouragement from your doctor or healthcare professional, friends, and family to help you quit. If you smoke while on this medication, quit again Your mouth may get dry. Chewing sugarless gum or sucking hard candy, and drinking plenty of water may help. Contact your doctor if the problem does not go away or is severe. You may get drowsy or dizzy. Do not drive, use machinery, or  do anything that needs mental alertness until you know how this medicine affects you. Do not stand or sit up quickly, especially if you are an older patient. This reduces the risk of dizzy or fainting spells. The use of this medicine may increase the chance of suicidal thoughts or actions. Pay special attention to how you are responding while on this medicine. Any worsening of mood, or thoughts of suicide or dying should be reported to your health care professional right away. What side effects may I notice from receiving this medicine? Side effects that you should report to your doctor or health care professional as soon as possible: -allergic reactions like skin rash, itching or hives, swelling of the face, lips, tongue, or throat -breathing problems -changes in vision -chest pain or chest tightness -confusion, trouble speaking or  understanding -fast, irregular heartbeat -feeling faint or lightheaded, falls -fever -pain in legs when walking -problems with balance, talking, walking -ringing in ears -sudden numbness or weakness of the face, arm or leg -suicidal thoughts or other mood changes -trouble passing urine or change in the amount of urine -unusual bleeding or bruising -unusually weak or tired Side effects that usually do not require medical attention (report to your doctor or health care professional if they continue or are bothersome): -constipation -headache -nausea, vomiting -strange dreams -stomach gas -trouble sleeping This list may not describe all possible side effects. Call your doctor for medical advice about side effects. You may report side effects to FDA at 1-800-FDA-1088. Where should I keep my medicine? Keep out of the reach of children. Store at room temperature between 15 and 30 degrees C (59 and 86 degrees F). Throw away any unused medicine after the expiration date. NOTE: This sheet is a summary. It may not cover all possible information. If you have questions  about this medicine, talk to your doctor, pharmacist, or health care provider.  2013, Elsevier/Gold Standard. (02/09/2010 3:12:38 PM)

## 2013-03-14 NOTE — Progress Notes (Signed)
Laurie Chang 08-20-1965 [id number]   History:    47 y.o.  for annual gyn exam who has not been seen in the office since 2012. Review of her record indicated That on 12/11/2010 patient underwent a total abdominal hysterectomy with right salpingo-oophorectomy as a result of symptomatic leiomyomatous uteri. Pathology report was as follows:  Diagnosis Uterus and cervix, with right fallopian tube and ovary - CERVIX: NABOTHIAN CYST. - ENDOMETRIUM: PROLIFERATIVE. NO EVIDENCE OF HYPERPLASIA OR MALIGNANCY. - MYOMETRIUM: LEIOMYOMATA. - UTERINE SEROSA: UNREMARKABLE. NO ENDOMETRIOSIS OR EVIDENCE OF MALIGNANCY. - RIGHT FALLOPIAN TUBE: UNREMARKABLE. - RIGHT OVARY: UNREMARKABLE. NO ENDOMETRIOSIS OR EVIDENCE OF MALIGNANCY  Patient has done well since her surgery. She does continue to smoke at least a pack of cigarettes per day. 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 She is taking her calcium and vitamin D twice a day. Her last mammogram was early this month which was normal but dense breast reduction in it. She stated she had a chest x-ray which was normal less than 5 years ago. Patient denies any prior history of abnormal Pap smear. Her primary physicians Dr. Dallas Schimke who had done her lab work. Review her labs indicated that she has not had a TSH and we will check that today since she is complaining of weight gain.   Past medical history,surgical history, family history and social history were all reviewed and documented in the EPIC chart.  Gynecologic History Patient's last menstrual period was 08/19/2010. Contraception: status post hysterectomy Last Pap: 2012?Marland Kitchen Results were: normal Last mammogram: 2014. Results were: normal but dense  Obstetric History OB History  Gravida Para Term Preterm AB SAB TAB Ectopic Multiple Living  3 3        3     # Outcome Date GA Lbr Len/2nd Weight Sex Delivery Anes PTL Lv  3 PAR           2 PAR           1 PAR                 ROS: A ROS was performed and pertinent positives and negatives are included in the history.  GENERAL: No fevers or chills. HEENT: No change in vision, no earache, sore throat or sinus congestion. NECK: No pain or stiffness. CARDIOVASCULAR: No chest pain or pressure. No palpitations. PULMONARY: No shortness of breath, cough or wheeze. GASTROINTESTINAL: No abdominal pain, nausea, vomiting or diarrhea, melena or bright red blood per rectum. GENITOURINARY: No urinary frequency, urgency, hesitancy or dysuria. MUSCULOSKELETAL: No joint or muscle pain, no back pain, no recent trauma. DERMATOLOGIC: No rash, no itching, no lesions. ENDOCRINE: No polyuria, polydipsia, no heat or cold intolerance. No recent change in weight. HEMATOLOGICAL: No anemia or easy bruising or bleeding. NEUROLOGIC: No headache, seizures, numbness, tingling or weakness. PSYCHIATRIC: No depression, no loss of interest in normal activity or change in sleep pattern.     Exam: chaperone present  BP 138/90  Ht 5\' 5"  (1.651 m)  Wt 186 lb (84.369 kg)  BMI 30.95 kg/m2  LMP 08/19/2010  Body mass index is 30.95 kg/(m^2).  General appearance : Well developed well nourished female. No acute distress HEENT: Neck supple, trachea midline, no carotid bruits, no thyroidmegaly Lungs: Clear to auscultation, no rhonchi or wheezes, or rib retractions  Heart: Regular rate and rhythm, no murmurs or gallops Breast:Examined in sitting and supine position were symmetrical in appearance, no palpable masses or tenderness,  no  skin retraction, no nipple inversion, no nipple discharge, no skin discoloration, no axillary or supraclavicular lymphadenopathy Abdomen: no palpable masses or tenderness, no rebound or guarding Extremities: no edema or skin discoloration or tenderness  Pelvic:  Bartholin, Urethra, Skene Glands: Within normal limits             Vagina: No gross lesions or discharge  Cervix: absent  Uterus Absent  Adnexa  Without  masses or tenderness  Anus and perineum  normal   Rectovaginal  normal sphincter tone without palpated masses or tenderness             Hemoccult that indicated     Assessment/Plan:  48 y.o. female for annual exam who was counseled once again on the detrimental effects of smoking. She is interested in moving forward to cut this mask he happened. She would like to go ahead and start Chantix the risks benefits and pros and cons were discussed as well as the black box warning. Literature and information was provided as well. Pap smear not done today the new guidelines were discussed. We'll check her TSH today. Next year she will need a repeat mammogram as a result of her dense breasts. She will need a screening colonoscopy next year.  New CDC guidelines is recommending patients be tested once in her lifetime for hepatitis C antibody who were born between [birth year] through 1967. This was discussed with the patient today and has agreed to be tested today.    Ok Edwards MD, 4:12 PM 03/14/2013

## 2013-03-15 ENCOUNTER — Encounter: Payer: Self-pay | Admitting: Gynecology

## 2013-03-15 LAB — TSH: TSH: 1.631 u[IU]/mL (ref 0.350–4.500)

## 2013-03-27 ENCOUNTER — Encounter: Payer: Self-pay | Admitting: Gynecology

## 2013-04-13 ENCOUNTER — Other Ambulatory Visit: Payer: Self-pay | Admitting: Physician Assistant

## 2013-04-17 ENCOUNTER — Other Ambulatory Visit: Payer: Self-pay | Admitting: Physician Assistant

## 2013-04-17 NOTE — Telephone Encounter (Signed)
Called- she had this rx called in on 9/26.  Disregard today

## 2013-05-24 ENCOUNTER — Other Ambulatory Visit: Payer: Self-pay

## 2013-07-15 ENCOUNTER — Ambulatory Visit: Payer: 59 | Admitting: Emergency Medicine

## 2013-07-15 VITALS — BP 160/102 | HR 93 | Temp 98.0°F | Resp 16 | Ht 65.5 in | Wt 185.4 lb

## 2013-07-15 DIAGNOSIS — A088 Other specified intestinal infections: Secondary | ICD-10-CM

## 2013-07-15 DIAGNOSIS — F17200 Nicotine dependence, unspecified, uncomplicated: Secondary | ICD-10-CM

## 2013-07-15 DIAGNOSIS — F17290 Nicotine dependence, other tobacco product, uncomplicated: Secondary | ICD-10-CM

## 2013-07-15 MED ORDER — LOPERAMIDE HCL 2 MG PO TABS: ORAL_TABLET | ORAL | Status: DC

## 2013-07-15 MED ORDER — VARENICLINE TARTRATE 0.5 MG PO TABS: ORAL_TABLET | ORAL | Status: DC

## 2013-07-15 MED ORDER — ONDANSETRON 8 MG PO TBDP: 8.0000 mg | ORAL_TABLET | Freq: Three times a day (TID) | ORAL | Status: DC | PRN

## 2013-07-15 NOTE — Patient Instructions (Signed)
Viral Gastroenteritis Viral gastroenteritis is also known as stomach flu. This condition affects the stomach and intestinal tract. It can cause sudden diarrhea and vomiting. The illness typically lasts 3 to 8 days. Most people develop an immune response that eventually gets rid of the virus. While this natural response develops, the virus can make you quite ill. CAUSES  Many different viruses can cause gastroenteritis, such as rotavirus or noroviruses. You can catch one of these viruses by consuming contaminated food or water. You may also catch a virus by sharing utensils or other personal items with an infected person or by touching a contaminated surface. SYMPTOMS  The most common symptoms are diarrhea and vomiting. These problems can cause a severe loss of body fluids (dehydration) and a body salt (electrolyte) imbalance. Other symptoms may include:  Fever.  Headache.  Fatigue.  Abdominal pain. DIAGNOSIS  Your caregiver can usually diagnose viral gastroenteritis based on your symptoms and a physical exam. A stool sample may also be taken to test for the presence of viruses or other infections. TREATMENT  This illness typically goes away on its own. Treatments are aimed at rehydration. The most serious cases of viral gastroenteritis involve vomiting so severely that you are not able to keep fluids down. In these cases, fluids must be given through an intravenous line (IV). HOME CARE INSTRUCTIONS   Drink enough fluids to keep your urine clear or pale yellow. Drink small amounts of fluids frequently and increase the amounts as tolerated.  Ask your caregiver for specific rehydration instructions.  Avoid:  Foods high in sugar.  Alcohol.  Carbonated drinks.  Tobacco.  Juice.  Caffeine drinks.  Extremely hot or cold fluids.  Fatty, greasy foods.  Too much intake of anything at one time.  Dairy products until 24 to 48 hours after diarrhea stops.  You may consume probiotics.  Probiotics are active cultures of beneficial bacteria. They may lessen the amount and number of diarrheal stools in adults. Probiotics can be found in yogurt with active cultures and in supplements.  Wash your hands well to avoid spreading the virus.  Only take over-the-counter or prescription medicines for pain, discomfort, or fever as directed by your caregiver. Do not give aspirin to children. Antidiarrheal medicines are not recommended.  Ask your caregiver if you should continue to take your regular prescribed and over-the-counter medicines.  Keep all follow-up appointments as directed by your caregiver. SEEK IMMEDIATE MEDICAL CARE IF:   You are unable to keep fluids down.  You do not urinate at least once every 6 to 8 hours.  You develop shortness of breath.  You notice blood in your stool or vomit. This may look like coffee grounds.  You have abdominal pain that increases or is concentrated in one small area (localized).  You have persistent vomiting or diarrhea.  You have a fever.  The patient is a child younger than 3 months, and he or she has a fever.  The patient is a child older than 3 months, and he or she has a fever and persistent symptoms.  The patient is a child older than 3 months, and he or she has a fever and symptoms suddenly get worse.  The patient is a baby, and he or she has no tears when crying. MAKE SURE YOU:   Understand these instructions.  Will watch your condition.  Will get help right away if you are not doing well or get worse. Document Released: 07/05/2005 Document Revised: 09/27/2011 Document Reviewed: 04/21/2011   ExitCare Patient Information 2014 [employer]. Clear Liquid Diet The clear liquid diet consists of foods that are liquid or will become liquid at room temperature. Examples of foods allowed on a clear liquid diet include fruit juice, broth or bouillon, gelatin, or frozen ice pops. You should be able to see through the  liquid. The purpose of this diet is to provide the necessary fluids, electrolytes (such as sodium and potassium), and energy to keep the body functioning during times when you are not able to consume a regular diet. A clear liquid diet should not be continued for long periods of time, as it is not nutritionally adequate.  A CLEAR LIQUID DIET MAY BE NEEDED:  When a sudden-onset (acute) condition occurs before or after surgery.   As the first step in oral feeding.   For fluid and electrolyte replacement in diarrheal diseases.   As a diet before certain medical tests are performed.  ADEQUACY The clear liquid diet is adequate only in ascorbic acid, according to the Recommended Dietary Allowances of the National Research Council.  CHOOSING FOODS Breads and Starches  Allowed: None are allowed.   Avoid: All are to be avoided.  Vegetables  Allowed: Strained vegetable juices.   Avoid: Any others.  Fruit  Allowed: Strained fruit juices and fruit drinks. Include 1 serving of citrus or vitamin C-enriched fruit juice daily.   Avoid: Any others.  Meat and Meat Substitutes  Allowed: None are allowed.   Avoid: All are to be avoided.  Milk Products  Allowed: None are allowed.   Avoid: All are to be avoided.  Soups and Combination Foods  Allowed: Clear bouillon, broth, or strained broth-based soups.   Avoid: Any others.  Desserts and Sweets  Allowed: Sugar, honey. High-protein gelatin. Flavored gelatin, ices, or frozen ice pops that do not contain milk.   Avoid: Any others.  Fats and Oils  Allowed: None are allowed.   Avoid: All are to be avoided.  Beverages  Allowed: Cereal beverages, coffee (regular or decaffeinated), tea, or soda at the discretion of your health care provider.   Avoid: Any others.  Condiments  Allowed: Salt.   Avoid: Any others, including pepper.  Supplements  Allowed: Liquid nutrition  beverages that you can see through.   Avoid: Any others that contain lactose or fiber. SAMPLE MEAL PLAN Breakfast  4 oz (120 mL) strained orange juice.   to 1 cup (120 to 240 mL) gelatin (plain or fortified).  1 cup (240 mL) beverage (coffee or tea).  Sugar, if desired. Midmorning Snack   cup (120 mL) gelatin (plain or fortified). Lunch  1 cup (240 mL) broth or consomm.  4 oz (120 mL) strained grapefruit juice.   cup (120 mL) gelatin (plain or fortified).  1 cup (240 mL) beverage (coffee or tea).  Sugar, if desired. Midafternoon Snack   cup (120 mL) fruit ice.   cup (120 mL) strained fruit juice. Dinner  1 cup (240 mL) broth or consomm.   cup (120 mL) cranberry juice.   cup (120 mL) flavored gelatin (plain or fortified).  1 cup (240 mL) beverage (coffee or tea).  Sugar, if desired. Evening Snack  4 oz (120 mL) strained apple juice (vitamin C-fortified).   cup (120 mL) flavored gelatin (plain or fortified). MAKE SURE YOU:  Understand these instructions.  Will watch your child's condition.  Will get help right away if your child is not doing well or gets worse. Document Released: 07/05/2005 Document Revised: 03/07/2013 Document   Reviewed: 12/05/2012 ExitCare Patient Information 2014 [employer].  

## 2013-07-15 NOTE — Progress Notes (Signed)
[facility] [address], [place] [address] [phone]  Date:  07/15/2013   Name:  Laurie Chang   DOB:  [birth year]   MRN:  [id number]  PCP:  Abbe Amsterdam, MD    Chief Complaint: Emesis, Nausea and Diarrhea   History of Present Illness:  Laurie Chang is a 47 y.o. very pleasant female patient who presents with the following:  Ill since Friday with nausea and vomiting and diarrhea.  Temp to 101.  The patient has no complaint of blood, mucous, or pus in her stools.  Her stools are watery and more than 6 in 24 hours.  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  No cough, coryza, wheezing or shortness of breath.  No improvement with over the counter medications or other home remedies. Denies other complaint or health concern today.   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  Patient Active Problem List   Diagnosis Date Noted  . Cigar smoker motivated to quit 03/14/2013  . Weight gain 03/14/2013  . HSV-2 seropositive 12/06/2012  . Microhematuria 11/22/2011    Past Medical History  Diagnosis Date  . Smoker     1/2 PPD  . Leiomyoma uteri   . Arthritis     Past Surgical History  Procedure Laterality Date  . Tubal ligation    . Laparoscopic cholecystectomy    . Vein surgery    . Hysteroscopy  11/2000    HYSTEROSCOPIC MYOMECTOMY  . Abdominal hysterectomy  12/11/10    TAH,RSO  . Oophorectomy  12/11/10    TAH,RSO    History  Substance Use Topics  . Smoking status: Current Every Day Smoker -- 1.00 packs/day for 28 years    Types: Cigarettes  . Smokeless tobacco: Never Used  . Alcohol Use: 0 - .5 oz/week    0-1 drink(s) per week     Comment: once in a blue moon    Family History  Problem Relation Age of Onset  . Hypertension Father   . Heart disease Father   . Hypertension Brother   . Cancer Mother     LUNG  . Cancer Maternal Grandmother   . Breast cancer Maternal Grandmother     No Known Allergies  Medication list has been  reviewed and updated.  Current Outpatient Prescriptions on File Prior to Visit  Medication Sig Dispense Refill  . acyclovir (ZOVIRAX) 400 MG tablet Take 1 tablet (400 mg total) by mouth 2 (two) times daily.  180 tablet  3  . CALCIUM PO Take by mouth.        . meloxicam (MOBIC) 15 MG tablet Take 1 tablet (15 mg total) by mouth daily.  30 tablet  1  . Multiple Vitamins-Iron (MULTIVITAMIN/IRON PO) Take by mouth.        . sulfamethoxazole-trimethoprim (BACTRIM DS,SEPTRA DS) 800-160 MG per tablet Take 1 tablet by mouth 2 (two) times daily.  20 tablet  0  . traMADol (ULTRAM) 50 MG tablet TAKE 2 TABLETS (100 MG TOTAL) BY MOUTH AT BEDTIME AS NEEDED FOR PAIN.  60 tablet  1  . buPROPion (WELLBUTRIN XL) 150 MG 24 hr tablet Take 1 tablet (150 mg total) by mouth daily.  30 tablet  5  . varenicline (CHANTIX) 0.5 MG tablet Chantix 0.5 mg PO qd x 3 days, then 0.5 mg PO BID x 4 days then 1 mg BID  60 tablet  2   No current facility-administered medications on file prior to visit.  Review of Systems:  As per HPI, otherwise negative.    Physical Examination: Filed Vitals:   07/15/13 1020  BP: 160/102  Pulse: 93  Temp: 98 F (36.7 C)  Resp: 16   Filed Vitals:   07/15/13 1020  Height: 5' 5.5" (1.664 m)  Weight: 185 lb 6.4 oz (84.097 kg)   Body mass index is 30.37 kg/(m^2). Ideal Body Weight: Weight in (lb) to have BMI = 25: 152.2  GEN: WDWN, mild distress, Non-toxic, A & O x 3 HEENT: Atraumatic, Normocephalic. Neck supple. No masses, No LAD. Ears and Nose: No external deformity. CV: RRR, No M/G/R. No JVD. No thrill. No extra heart sounds. PULM: CTA B, no wheezes, crackles, rhonchi. No retractions. No resp. distress. No accessory muscle use. ABD: S, NT, ND, +BS. No rebound. No HSM. EXTR: No c/c/e NEURO Normal gait.  PSYCH: Normally interactive. Conversant. Not depressed or anxious appearing.  Calm demeanor.    Assessment and Plan: Gastroenteritis Tobacco abuse   Signed,  Phillips Odor, MD

## 2013-07-16 ENCOUNTER — Other Ambulatory Visit: Payer: Self-pay | Admitting: Family Medicine

## 2013-07-17 ENCOUNTER — Ambulatory Visit: Payer: 59 | Admitting: Emergency Medicine

## 2013-07-17 VITALS — BP 132/84 | HR 104 | Temp 98.9°F | Resp 16 | Ht 66.0 in | Wt 185.0 lb

## 2013-07-17 DIAGNOSIS — J111 Influenza due to unidentified influenza virus with other respiratory manifestations: Secondary | ICD-10-CM

## 2013-07-17 MED ORDER — PROMETHAZINE-CODEINE 6.25-10 MG/5ML PO SYRP: 5.0000 mL | ORAL_SOLUTION | Freq: Four times a day (QID) | ORAL | Status: DC | PRN

## 2013-07-17 MED ORDER — OSELTAMIVIR PHOSPHATE 75 MG PO CAPS: 75.0000 mg | ORAL_CAPSULE | Freq: Two times a day (BID) | ORAL | Status: DC

## 2013-07-17 NOTE — Progress Notes (Signed)
[facility] [address] [place] [address] [phone]  Date:  07/17/2013   Name:  Laurie Chang   DOB:  [birth year]   MRN:  [id number]  PCP:  Abbe Amsterdam, MD    Chief Complaint: Follow-up and Illness   History of Present Illness:  Laurie Chang is a 47 y.o. very pleasant female patient who presents with the following:  Seen Sunday with diarrhea. Says her stools are forming but still a bit loose.  Sudden on set of cough, malaise and fever started yesterday.  Cough is nonproductive.  Mucoid nasal drainage that is copious.  Cough worse at night.  No wheezing.  Some shortness of breath with coughing.  No vomiting but nauseated.  The patient has no complaint of blood, mucous, or pus in her stools.   Patient Active Problem List   Diagnosis Date Noted  . Cigar smoker motivated to quit 03/14/2013  . Weight gain 03/14/2013  . HSV-2 seropositive 12/06/2012  . Microhematuria 11/22/2011    Past Medical History  Diagnosis Date  . Smoker     1/2 PPD  . Leiomyoma uteri   . Arthritis     Past Surgical History  Procedure Laterality Date  . Tubal ligation    . Laparoscopic cholecystectomy    . Vein surgery    . Hysteroscopy  11/2000    HYSTEROSCOPIC MYOMECTOMY  . Abdominal hysterectomy  12/11/10    TAH,RSO  . Oophorectomy  12/11/10    TAH,RSO    History  Substance Use Topics  . Smoking status: Current Every Day Smoker -- 1.00 packs/day for 28 years    Types: Cigarettes  . Smokeless tobacco: Never Used  . Alcohol Use: 0 - .5 oz/week    0-1 drink(s) per week     Comment: once in a blue moon    Family History  Problem Relation Age of Onset  . Hypertension Father   . Heart disease Father   . Hypertension Brother   . Cancer Mother     LUNG  . Cancer Maternal Grandmother   . Breast cancer Maternal Grandmother     No Known Allergies  Medication list has been reviewed and updated.  Current Outpatient Prescriptions on File Prior  to Visit  Medication Sig Dispense Refill  . acyclovir (ZOVIRAX) 400 MG tablet Take 1 tablet (400 mg total) by mouth 2 (two) times daily.  180 tablet  3  . CALCIUM PO Take by mouth.        . loperamide (IMODIUM A-D) 2 MG tablet 2 now and one q1h prn diarrhea.  Max 8 in 24 hours  30 tablet  0  . meloxicam (MOBIC) 15 MG tablet Take 1 tablet (15 mg total) by mouth daily.  30 tablet  1  . Multiple Vitamins-Iron (MULTIVITAMIN/IRON PO) Take by mouth.        . ondansetron (ZOFRAN-ODT) 8 MG disintegrating tablet Take 1 tablet (8 mg total) by mouth every 8 (eight) hours as needed for nausea.  30 tablet  0  . traMADol (ULTRAM) 50 MG tablet TAKE 2 TABLETS BY MOUTH AT BEDTIME AS NEEDED FOR PAIN  60 tablet  0  . varenicline (CHANTIX) 0.5 MG tablet Chantix 0.5 mg PO qd x 3 days, then 0.5 mg PO BID x 4 days then 1 mg BID  60 tablet  2   No current facility-administered medications on file prior to visit.    Review of Systems:  As per HPI, otherwise  negative.    Physical Examination: Filed Vitals:   07/17/13 1830  BP: 132/84  Pulse: 104  Temp: 98.9 F (37.2 C)  Resp: 16   Filed Vitals:   07/17/13 1830  Height: 5\' 6"  (1.676 m)  Weight: 185 lb (83.915 kg)   Body mass index is 29.87 kg/(m^2). Ideal Body Weight: Weight in (lb) to have BMI = 25: 154.6  GEN: WDWN, NAD, Non-toxic, A & O x 3 HEENT: Atraumatic, Normocephalic. Neck supple. No masses, No LAD. Ears and Nose: No external deformity. CV: RRR, No M/G/R. No JVD. No thrill. No extra heart sounds. PULM: CTA B, no wheezes, crackles, rhonchi. No retractions. No resp. distress. No accessory muscle use. ABD: S, NT, ND, +BS. No rebound. No HSM. EXTR: No c/c/e NEURO Normal gait.  PSYCH: Normally interactive. Conversant. Not depressed or anxious appearing.  Calm demeanor.    Assessment and Plan: Influenza Resolving gastroenteritis tamiflu Phen c cod Increase liquids   Signed,  Phillips Odor, MD

## 2013-07-17 NOTE — Telephone Encounter (Signed)
Dr Patsy Lager, do you want to RF or have pt RTC?

## 2013-07-17 NOTE — Patient Instructions (Signed)
Dehydration, Adult °Dehydration is when you lose more fluids from the body than you take in. Vital organs like the kidneys, brain, and heart cannot function without a proper amount of fluids and salt. Any loss of fluids from the body can cause dehydration.  °CAUSES  °· Vomiting. °· Diarrhea. °· Excessive sweating. °· Excessive urine output. °· Fever. °SYMPTOMS  °Mild dehydration °· Thirst. °· Dry lips. °· Slightly dry mouth. °Moderate dehydration °· Very dry mouth. °· Sunken eyes. °· Skin does not bounce back quickly when lightly pinched and released. °· Dark urine and decreased urine production. °· Decreased tear production. °· Headache. °Severe dehydration °· Very dry mouth. °· Extreme thirst. °· Rapid, weak pulse (more than 100 beats per minute at rest). °· Cold hands and feet. °· Not able to sweat in spite of heat and temperature. °· Rapid breathing. °· Blue lips. °· Confusion and lethargy. °· Difficulty being awakened. °· Minimal urine production. °· No tears. °DIAGNOSIS  °Your caregiver will diagnose dehydration based on your symptoms and your exam. Blood and urine tests will help confirm the diagnosis. The diagnostic evaluation should also identify the cause of dehydration. °TREATMENT  °Treatment of mild or moderate dehydration can often be done at home by increasing the amount of fluids that you drink. It is best to drink small amounts of fluid more often. Drinking too much at one time can make vomiting worse. Refer to the home care instructions below. °Severe dehydration needs to be treated at the hospital where you will probably be given intravenous (IV) fluids that contain water and electrolytes. °HOME CARE INSTRUCTIONS  °· Ask your caregiver about specific rehydration instructions. °· Drink enough fluids to keep your urine clear or pale yellow. °· Drink small amounts frequently if you have nausea and vomiting. °· Eat as you normally do. °· Avoid: °· Foods or drinks high in sugar. °· Carbonated  drinks. °· Juice. °· Extremely hot or cold fluids. °· Drinks with caffeine. °· Fatty, greasy foods. °· Alcohol. °· Tobacco. °· Overeating. °· Gelatin desserts. °· Wash your hands well to avoid spreading bacteria and viruses. °· Only take over-the-counter or prescription medicines for pain, discomfort, or fever as directed by your caregiver. °· Ask your caregiver if you should continue all prescribed and over-the-counter medicines. °· Keep all follow-up appointments with your caregiver. °SEEK MEDICAL CARE IF: °· You have abdominal pain and it increases or stays in one area (localizes). °· You have a rash, stiff neck, or severe headache. °· You are irritable, sleepy, or difficult to awaken. °· You are weak, dizzy, or extremely thirsty. °SEEK IMMEDIATE MEDICAL CARE IF:  °· You are unable to keep fluids down or you get worse despite treatment. °· You have frequent episodes of vomiting or diarrhea. °· You have blood or green matter (bile) in your vomit. °· You have blood in your stool or your stool looks black and tarry. °· You have not urinated in 6 to 8 hours, or you have only urinated a small amount of very dark urine. °· You have a fever. °· You faint. °MAKE SURE YOU:  °· Understand these instructions. °· Will watch your condition. °· Will get help right away if you are not doing well or get worse. °Document Released: 07/05/2005 Document Revised: 09/27/2011 Document Reviewed: 02/22/2011 °[employer]® Patient Information ©2014 [employer]. °Influenza, Adult °Influenza ("the flu") is a viral infection of the respiratory tract. It occurs more often in winter months because people spend more time in close contact   with one another. Influenza can make you feel very sick. Influenza easily spreads from person to person (contagious). °CAUSES  °Influenza is caused by a virus that infects the respiratory tract. You can catch the virus by breathing in droplets from an infected person's cough or sneeze. You can also catch the  virus by touching something that was recently contaminated with the virus and then touching your mouth, nose, or eyes. °SYMPTOMS  °Symptoms typically last 4 to 10 days and may include: °· Fever. °· Chills. °· Headache, body aches, and muscle aches. °· Sore throat. °· Chest discomfort and cough. °· Poor appetite. °· Weakness or feeling tired. °· Dizziness. °· Nausea or vomiting. °DIAGNOSIS  °Diagnosis of influenza is often made based on your history and a physical exam. A nose or throat swab test can be done to confirm the diagnosis. °RISKS AND COMPLICATIONS °You may be at risk for a more severe case of influenza if you smoke cigarettes, have diabetes, have chronic heart disease (such as heart failure) or lung disease (such as asthma), or if you have a weakened immune system. Elderly people and pregnant women are also at risk for more serious infections. The most common complication of influenza is a lung infection (pneumonia). Sometimes, this complication can require emergency medical care and may be life-threatening. °PREVENTION  °An annual influenza vaccination (flu shot) is the best way to avoid getting influenza. An annual flu shot is now routinely recommended for all adults in the [place] °TREATMENT  °In mild cases, influenza goes away on its own. Treatment is directed at relieving symptoms. For more severe cases, your caregiver may prescribe antiviral medicines to shorten the sickness. Antibiotic medicines are not effective, because the infection is caused by a virus, not by bacteria. °HOME CARE INSTRUCTIONS °· Only take over-the-counter or prescription medicines for pain, discomfort, or fever as directed by your caregiver. °· Use a cool mist humidifier to make breathing easier. °· Get plenty of rest until your temperature returns to normal. This usually takes 3 to 4 days. °· Drink enough fluids to keep your urine clear or pale yellow. °· Cover your mouth and nose when coughing or sneezing, and wash your hands  well to avoid spreading the virus. °· Stay home from work or school until your fever has been gone for at least 1 full day. °SEEK MEDICAL CARE IF:  °· You have chest pain or a deep cough that worsens or produces more mucus. °· You have nausea, vomiting, or diarrhea. °SEEK IMMEDIATE MEDICAL CARE IF:  °· You have difficulty breathing, shortness of breath, or your skin or nails turn bluish. °· You have severe neck pain or stiffness. °· You have a severe headache, facial pain, or earache. °· You have a worsening or recurring fever. °· You have nausea or vomiting that cannot be controlled. °MAKE SURE YOU: °· Understand these instructions. °· Will watch your condition. °· Will get help right away if you are not doing well or get worse. °Document Released: 07/02/2000 Document Revised: 01/04/2012 Document Reviewed: 10/04/2011 °ExitCare® Patient Information ©2014 [employer]. ° °

## 2013-12-13 ENCOUNTER — Other Ambulatory Visit: Payer: Self-pay | Admitting: Family Medicine

## 2013-12-13 DIAGNOSIS — M1990 Unspecified osteoarthritis, unspecified site: Secondary | ICD-10-CM

## 2013-12-25 ENCOUNTER — Other Ambulatory Visit: Payer: Self-pay | Admitting: Family Medicine

## 2014-02-21 ENCOUNTER — Emergency Department (HOSPITAL_COMMUNITY): Payer: 59

## 2014-02-21 ENCOUNTER — Encounter (HOSPITAL_COMMUNITY): Payer: Self-pay | Admitting: Emergency Medicine

## 2014-02-21 ENCOUNTER — Emergency Department (HOSPITAL_COMMUNITY)
Admission: EM | Admit: 2014-02-21 | Discharge: 2014-02-21 | Disposition: A | Discharge status: Home / Self Care | Payer: 59 | Attending: Emergency Medicine | Admitting: Emergency Medicine

## 2014-02-21 DIAGNOSIS — Z79899 Other long term (current) drug therapy: Secondary | ICD-10-CM | POA: Diagnosis not present

## 2014-02-21 DIAGNOSIS — M25531 Pain in right wrist: Secondary | ICD-10-CM

## 2014-02-21 DIAGNOSIS — F17200 Nicotine dependence, unspecified, uncomplicated: Secondary | ICD-10-CM | POA: Diagnosis not present

## 2014-02-21 DIAGNOSIS — Z8739 Personal history of other diseases of the musculoskeletal system and connective tissue: Secondary | ICD-10-CM | POA: Diagnosis not present

## 2014-02-21 DIAGNOSIS — M25539 Pain in unspecified wrist: Secondary | ICD-10-CM | POA: Insufficient documentation

## 2014-02-21 DIAGNOSIS — Z87448 Personal history of other diseases of urinary system: Secondary | ICD-10-CM | POA: Insufficient documentation

## 2014-02-21 MED ORDER — HYDROCODONE-ACETAMINOPHEN 5-325 MG PO TABS: 1.0000 | ORAL_TABLET | Freq: Four times a day (QID) | ORAL | Status: DC | PRN

## 2014-02-21 NOTE — ED Provider Notes (Signed)
CSN: [id number]     Arrival date & time 02/21/14  1545 History   None   This chart was scribed for non-physician practitioner, Cleatrice Burke, working with Ernestina Patches, MD by Terressa Koyanagi, ED Scribe. This patient was seen in room WTR5/WTR5 and the patient's care was started at 5:48 PM.  Chief Complaint  Patient presents with  . Wrist Pain   The history is provided by the patient. No language interpreter was used.   HPI Comments: Laurie Chang is a 48 y.o. female, with medical Hx noted below, who presents to the Emergency Department complaining of intermittent right wrist pain onset 1 day ago while at work. Pt describes her pain as a sharp pain followed by tingling to her pinky finger, ring finger and 1/2 of her middle finger; and rates her pain an 8 out of 10 in the pain scale. Pt notes that her work requires her to do repetitive work with her hands. Pt denies any specific injury to the said area recently.   Past Medical History  Diagnosis Date  . Smoker     1/2 PPD  . Leiomyoma uteri   . Arthritis    Past Surgical History  Procedure Laterality Date  . Tubal ligation    . Laparoscopic cholecystectomy    . Vein surgery    . Hysteroscopy  11/2000    HYSTEROSCOPIC MYOMECTOMY  . Abdominal hysterectomy  12/11/10    TAH,RSO  . Oophorectomy  12/11/10    TAH,RSO   Family History  Problem Relation Age of Onset  . Hypertension Father   . Heart disease Father   . Hypertension Brother   . Cancer Mother     LUNG  . Cancer Maternal Grandmother   . Breast cancer Maternal Grandmother    History  Substance Use Topics  . Smoking status: Current Every Day Smoker -- 1.00 packs/day for 28 years    Types: Cigarettes  . Smokeless tobacco: Never Used  . Alcohol Use: 0.0 - 0.5 oz/week    0-1 drink(s) per week     Comment: once in a blue moon   OB History   Grav Para Term Preterm Abortions TAB SAB Ect Mult Living   3 3        3      Review of Systems  Constitutional: Negative  for fever and chills.  Respiratory: Negative for shortness of breath.   Musculoskeletal:       Right wrist pain   Neurological:       Tingling of right pinky, ring and 1/2 of middle finger.   Psychiatric/Behavioral: Negative for confusion.  All other systems reviewed and are negative.  Allergies  Review of patient's allergies indicates no known allergies.  Home Medications   Prior to Admission medications   Medication Sig Start Date End Date Taking? Authorizing Provider  acyclovir (ZOVIRAX) 400 MG tablet Take 400 mg by mouth 2 (two) times daily.   Yes Historical Provider, MD  CALCIUM PO Take 1 tablet by mouth daily.    Yes Historical Provider, MD  Multiple Vitamins-Iron (MULTIVITAMIN/IRON PO) Take 1 tablet by mouth daily.    Yes Historical Provider, MD   Triage Vitals: BP 126/91  Pulse 85  Temp(Src) 98.6 F (37 C) (Oral)  Resp 18  SpO2 99%  LMP 08/19/2010 Physical Exam  Nursing note and vitals reviewed. Constitutional: She is oriented to person, place, and time. She appears well-developed and well-nourished. No distress.  HENT:  Head: Normocephalic and atraumatic.  Right Ear: External ear normal.  Left Ear: External ear normal.  Nose: Nose normal.  Mouth/Throat: Oropharynx is clear and moist.  Eyes: Conjunctivae are normal.  Neck: Normal range of motion.  Cardiovascular: Normal rate, regular rhythm and normal heart sounds.   Pulmonary/Chest: Effort normal and breath sounds normal. No stridor. No respiratory distress. She has no wheezes. She has no rales.  Abdominal: Soft. She exhibits no distension.  Musculoskeletal: Normal range of motion.  Tenderness to palpation over ulnar aspect of right hand. Cap refill less than 3 seconds in all fingers.  Grip strength 5 out of 5. Tingling with palpation to 4th and 5th finger with some tingling to 3rd finger.   Neurological: She is alert and oriented to person, place, and time. She has normal strength.  Skin: Skin is warm and dry.  She is not diaphoretic. No erythema.  Psychiatric: She has a normal mood and affect. Her behavior is normal.    ED Course  Procedures (including critical care time) DIAGNOSTIC STUDIES: Oxygen Saturation is 99% on RA, nl by my interpretation.    COORDINATION OF CARE: 5:50 PM-Discussed treatment plan which includes imaging results, continued use of ibuprofen, f/u with PCP in the next week, and splint placement with pt at bedside and pt agreed to plan. Pt reports that she is driving home from the ED.   Labs Review Labs Reviewed - No data to display  Imaging Review Dg Wrist Complete Right  02/21/2014   CLINICAL DATA:  RIGHT wrist pain.  EXAM: RIGHT WRIST - COMPLETE 3+ VIEW  COMPARISON:  None.  FINDINGS: Anatomic alignment of the RIGHT wrist. STT joint osteoarthritis. No fracture. Alignment of the carpal bones is within normal limits. Nonspecific carpal cyst is present in the proximal scaphoid bone.  IMPRESSION: Mild osteoarthritis of the wrist.  No acute osseous abnormality.   Electronically Signed   By: Dereck Ligas [name]   On: 02/21/2014 16:44     EKG Interpretation None      MDM   Final diagnoses:  Right wrist pain   Patient presents to ED with right wrist pain. XR shows mild osteoarthritis. Grip strength 5/5. Neurovascularly intact. Patient given wrist splint. Encouraged to wear this at work while doing bothersome movements. She will follow up with her PCP. Return instructions given. Vital signs stable for discharge. Patient / Family / Caregiver informed of clinical course, understand medical decision-making process, and agree with plan.   I personally performed the services described in this documentation, which was scribed in my presence. The recorded information has been reviewed and is accurate.    Elwyn Lade, PA-C 03/02/14 1229

## 2014-02-21 NOTE — ED Notes (Signed)
Pt does repetitive work with hands. Pt started having pain in rt wrist yesterday.  No prior.  No injury.

## 2014-03-05 NOTE — ED Provider Notes (Signed)
Medical screening examination/treatment/procedure(s) were performed by non-physician practitioner and as supervising physician I was immediately available for consultation/collaboration.    Ernestina Patches, MD 03/05/14 1500

## 2014-03-15 ENCOUNTER — Encounter: Payer: 59 | Admitting: Gynecology

## 2014-03-28 ENCOUNTER — Other Ambulatory Visit: Payer: Self-pay | Admitting: Family Medicine

## 2014-04-01 ENCOUNTER — Encounter: Payer: Self-pay | Admitting: Gynecology

## 2014-04-01 ENCOUNTER — Ambulatory Visit (INDEPENDENT_AMBULATORY_CARE_PROVIDER_SITE_OTHER): Payer: 59 | Admitting: Gynecology

## 2014-04-01 VITALS — BP 148/90 | Wt 181.0 lb

## 2014-04-01 DIAGNOSIS — F17200 Nicotine dependence, unspecified, uncomplicated: Secondary | ICD-10-CM

## 2014-04-01 DIAGNOSIS — E663 Overweight: Secondary | ICD-10-CM

## 2014-04-01 DIAGNOSIS — Z01419 Encounter for gynecological examination (general) (routine) without abnormal findings: Secondary | ICD-10-CM

## 2014-04-01 NOTE — Patient Instructions (Signed)
Influenza Virus Vaccine (Flucelvax) What is this medicine? INFLUENZA VIRUS VACCINE (in floo EN zuh VAHY ruhs vak SEEN) helps to reduce the risk of getting influenza also known as the flu. The vaccine only helps protect you against some strains of the flu. This medicine may be used for other purposes; ask your health care provider or pharmacist if you have questions. COMMON BRAND NAME(S): FLUCELVAX What should I tell my health care provider before I take this medicine? They need to know if you have any of these conditions: -bleeding disorder like hemophilia -fever or infection -Guillain-Barre syndrome or other neurological problems -immune system problems -infection with the human immunodeficiency virus (HIV) or AIDS -low blood platelet counts -multiple sclerosis -an unusual or allergic reaction to influenza virus vaccine, other medicines, foods, dyes or preservatives -pregnant or trying to get pregnant -breast-feeding How should I use this medicine? This vaccine is for injection into a muscle. It is given by a health care professional. A copy of Vaccine Information Statements will be given before each vaccination. Read this sheet carefully each time. The sheet may change frequently. Talk to your pediatrician regarding the use of this medicine in children. Special care may be needed. Overdosage: If you think you've taken too much of this medicine contact a poison control center or emergency room at once. Overdosage: If you think you have taken too much of this medicine contact a poison control center or emergency room at once. NOTE: This medicine is only for you. Do not share this medicine with others. What if I miss a dose? This does not apply. What may interact with this medicine? -chemotherapy or radiation therapy -medicines that lower your immune system like etanercept, anakinra, infliximab, and adalimumab -medicines that treat or prevent blood clots like  warfarin -phenytoin -steroid medicines like prednisone or cortisone -theophylline -vaccines This list may not describe all possible interactions. Give your health care provider a list of all the medicines, herbs, non-prescription drugs, or dietary supplements you use. Also tell them if you smoke, drink alcohol, or use illegal drugs. Some items may interact with your medicine. What should I watch for while using this medicine? Report any side effects that do not go away within 3 days to your doctor or health care professional. Call your health care provider if any unusual symptoms occur within 6 weeks of receiving this vaccine. You may still catch the flu, but the illness is not usually as bad. You cannot get the flu from the vaccine. The vaccine will not protect against colds or other illnesses that may cause fever. The vaccine is needed every year. What side effects may I notice from receiving this medicine? Side effects that you should report to your doctor or health care professional as soon as possible: -allergic reactions like skin rash, itching or hives, swelling of the face, lips, or tongue Side effects that usually do not require medical attention (Report these to your doctor or health care professional if they continue or are bothersome.): -fever -headache -muscle aches and pains -pain, tenderness, redness, or swelling at the injection site -tiredness This list may not describe all possible side effects. Call your doctor for medical advice about side effects. You may report side effects to FDA at 1-800-FDA-1088. Where should I keep my medicine? The vaccine will be given by a health care professional in a clinic, pharmacy, doctor's office, or other health care setting. You will not be given vaccine doses to store at home. NOTE: This sheet is a summary.   It may not cover all possible information. If you have questions about this medicine, talk to your doctor, pharmacist, or health care  provider.  2015, Elsevier/Gold Standard. (2011-06-16 14:06:47) Smoking Cessation Quitting smoking is important to your health and has many advantages. However, it is not always easy to quit since nicotine is a very addictive drug. Oftentimes, people try 3 times or more before being able to quit. This document explains the best ways for you to prepare to quit smoking. Quitting takes hard work and a lot of effort, but you can do it. ADVANTAGES OF QUITTING SMOKING  You will live longer, feel better, and live better.  Your body will feel the impact of quitting smoking almost immediately.  Within 20 minutes, blood pressure decreases. Your pulse returns to its normal level.  After 8 hours, carbon monoxide levels in the blood return to normal. Your oxygen level increases.  After 24 hours, the chance of having a heart attack starts to decrease. Your breath, hair, and body stop smelling like smoke.  After 48 hours, damaged nerve endings begin to recover. Your sense of taste and smell improve.  After 72 hours, the body is virtually free of nicotine. Your bronchial tubes relax and breathing becomes easier.  After 2 to 12 weeks, lungs can hold more air. Exercise becomes easier and circulation improves.  The risk of having a heart attack, stroke, cancer, or lung disease is greatly reduced.  After 1 year, the risk of coronary heart disease is cut in half.  After 5 years, the risk of stroke falls to the same as a nonsmoker.  After 10 years, the risk of lung cancer is cut in half and the risk of other cancers decreases significantly.  After 15 years, the risk of coronary heart disease drops, usually to the level of a nonsmoker.  If you are pregnant, quitting smoking will improve your chances of having a healthy baby.  The people you live with, especially any children, will be healthier.  You will have extra money to spend on things other than cigarettes. QUESTIONS TO THINK ABOUT BEFORE  ATTEMPTING TO QUIT You may want to talk about your answers with your health care provider.  Why do you want to quit?  If you tried to quit in the past, what helped and what did not?  What will be the most difficult situations for you after you quit? How will you plan to handle them?  Who can help you through the tough times? Your family? Friends? A health care provider?  What pleasures do you get from smoking? What ways can you still get pleasure if you quit? Here are some questions to ask your health care provider:  How can you help me to be successful at quitting?  What medicine do you think would be best for me and how should I take it?  What should I do if I need more help?  What is smoking withdrawal like? How can I get information on withdrawal? GET READY  Set a quit date.  Change your environment by getting rid of all cigarettes, ashtrays, matches, and lighters in your home, car, or work. Do not let people smoke in your home.  Review your past attempts to quit. Think about what worked and what did not. GET SUPPORT AND ENCOURAGEMENT You have a better chance of being successful if you have help. You can get support in many ways.  Tell your family, friends, and coworkers that you are going to quit  and need their support. Ask them not to smoke around you.  Get individual, group, or telephone counseling and support. Programs are available at [employer] and health centers. Call your local health department for information about programs in your area.  Spiritual beliefs and practices may help some smokers quit.  Download a "quit meter" on your computer to keep track of quit statistics, such as how long you have gone without smoking, cigarettes not smoked, and money saved.  Get a self-help book about quitting smoking and staying off tobacco. [place] yourself from urges to smoke. Talk to someone, go for a walk, or occupy your time with a  task.  Change your normal routine. Take a different route to work. Drink tea instead of coffee. Eat breakfast in a different place.  Reduce your stress. Take a hot bath, exercise, or read a book.  Plan something enjoyable to do every day. Reward yourself for not smoking.  Explore interactive web-based programs that specialize in helping you quit. GET MEDICINE AND USE IT CORRECTLY Medicines can help you stop smoking and decrease the urge to smoke. Combining medicine with the above behavioral methods and support can greatly increase your chances of successfully quitting smoking.  Nicotine replacement therapy helps deliver nicotine to your body without the negative effects and risks of smoking. Nicotine replacement therapy includes nicotine gum, lozenges, inhalers, nasal sprays, and skin patches. Some may be available over-the-counter and others require a prescription.  Antidepressant medicine helps people abstain from smoking, but how this works is unknown. This medicine is available by prescription.  Nicotinic receptor partial agonist medicine simulates the effect of nicotine in your brain. This medicine is available by prescription. Ask your health care provider for advice about which medicines to use and how to use them based on your health history. Your health care provider will tell you what side effects to look out for if you choose to be on a medicine or therapy. Carefully read the information on the package. Do not use any other product containing nicotine while using a nicotine replacement product.  RELAPSE OR DIFFICULT SITUATIONS Most relapses occur within the first 3 months after quitting. Do not be discouraged if you start smoking again. Remember, most people try several times before finally quitting. You may have symptoms of withdrawal because your body is used to nicotine. You may crave cigarettes, be irritable, feel very hungry, cough often, get headaches, or have difficulty  concentrating. The withdrawal symptoms are only temporary. They are strongest when you first quit, but they will go away within 10-14 days. To reduce the chances of relapse, try to:  Avoid drinking alcohol. Drinking lowers your chances of successfully quitting.  Reduce the amount of caffeine you consume. Once you quit smoking, the amount of caffeine in your body increases and can give you symptoms, such as a rapid heartbeat, sweating, and anxiety.  Avoid smokers because they can make you want to smoke.  Do not let weight gain distract you. Many smokers will gain weight when they quit, usually less than 10 pounds. Eat a healthy diet and stay active. You can always lose the weight gained after you quit.  Find ways to improve your mood other than smoking. FOR MORE INFORMATION  www.smokefree.gov  Document Released: 06/29/2001 Document Revised: 11/19/2013 Document Reviewed: 10/14/2011 [facility] Patient Information 2015 [place], [place]. This information is not intended to replace advice given to you by your health care provider. Make sure you discuss any  questions you have with your health care provider.  

## 2014-04-01 NOTE — Progress Notes (Signed)
Laurie Chang 04-22-1966 [id number]   History:    48 y.o.  for annual gyn exam with no complaints today. Patient with past history in 2012 of a total abdominal hysterectomy with right salpingo-oophorectomy as a result of symptomatic leiomyomatous uteri. Patient states she continues to smoke but has cut down to almost a pack every 2-3 days. She has been counseled in the past. She will be receiving a flu vaccine at work. Her PCP Dr. Edilia Bo has been doing her blood work. Patient was taking Valtrex daily because of HSV blood test but she has never had a breakout? Patient reports no previous history of any abnormal Pap smears.  Past medical history,surgical history, family history and social history were all reviewed and documented in the EPIC chart.  Gynecologic History Patient's last menstrual period was 08/19/2010. Contraception: status post hysterectomy Last Pap: Several years ago. Results were: normal Last mammogram: 2014. Results were: Normal but dense  Obstetric History OB History  Gravida Para Term Preterm AB SAB TAB Ectopic Multiple Living  3 3        3     # Outcome Date GA Lbr Len/2nd Weight Sex Delivery Anes PTL Lv  3 PAR           2 PAR           1 PAR                ROS: A ROS was performed and pertinent positives and negatives are included in the history.  GENERAL: No fevers or chills. HEENT: No change in vision, no earache, sore throat or sinus congestion. NECK: No pain or stiffness. CARDIOVASCULAR: No chest pain or pressure. No palpitations. PULMONARY: No shortness of breath, cough or wheeze. GASTROINTESTINAL: No abdominal pain, nausea, vomiting or diarrhea, melena or bright red blood per rectum. GENITOURINARY: No urinary frequency, urgency, hesitancy or dysuria. MUSCULOSKELETAL: No joint or muscle pain, no back pain, no recent trauma. DERMATOLOGIC: No rash, no itching, no lesions. ENDOCRINE: No polyuria, polydipsia, no heat or cold intolerance. No recent change in  weight. HEMATOLOGICAL: No anemia or easy bruising or bleeding. NEUROLOGIC: No headache, seizures, numbness, tingling or weakness. PSYCHIATRIC: No depression, no loss of interest in normal activity or change in sleep pattern.     Exam: chaperone present  BP 148/90  Wt 181 lb (82.101 kg)  LMP 08/19/2010  Body mass index is 29.23 kg/(m^2).  General appearance : Well developed well nourished female. No acute distress HEENT: Neck supple, trachea midline, no carotid bruits, no thyroidmegaly Lungs: Clear to auscultation, no rhonchi or wheezes, or rib retractions  Heart: Regular rate and rhythm, no murmurs or gallops Breast:Examined in sitting and supine position were symmetrical in appearance, no palpable masses or tenderness,  no skin retraction, no nipple inversion, no nipple discharge, no skin discoloration, no axillary or supraclavicular lymphadenopathy Abdomen: no palpable masses or tenderness, no rebound or guarding Extremities: no edema or skin discoloration or tenderness  Pelvic:  Bartholin, Urethra, Skene Glands: Within normal limits             Vagina: No gross lesions or discharge  Cervix: Absent  Uterus absent  Adnexa  Without masses or tenderness  Anus and perineum  normal   Rectovaginal  normal sphincter tone without palpated masses or tenderness             Hemoccult cards provided     Assessment/Plan:  48 y.o. female for annual exam or need to schedule a  colonoscopy this year. Fecal Hemoccult occult cards provided. PCP will be drawn her blood work. Pap smear not done. We discussed importance of monthly self breast examination. We discussed importance of scheduling a 3-dimensional mammogram because of her dense breast. We discussed once again and provided literature on assistance with her smoking habit. Also discussed importance of calcium vitamin D and regular exercise for osteoporosis prevention.  Note: This dictation was prepared with  Dragon/digital dictation along  withSmart phrase technology. Any transcriptional errors that result from this process are unintentional.   Terrance Mass MD, 12:05 PM 04/01/2014

## 2014-05-02 ENCOUNTER — Encounter (HOSPITAL_COMMUNITY): Payer: Self-pay | Admitting: Emergency Medicine

## 2014-05-02 ENCOUNTER — Emergency Department (HOSPITAL_COMMUNITY): Payer: 59

## 2014-05-02 ENCOUNTER — Emergency Department (HOSPITAL_COMMUNITY)
Admission: EM | Admit: 2014-05-02 | Discharge: 2014-05-02 | Disposition: A | Discharge status: Home / Self Care | Payer: 59 | Attending: Emergency Medicine | Admitting: Emergency Medicine

## 2014-05-02 DIAGNOSIS — S299XXA Unspecified injury of thorax, initial encounter: Secondary | ICD-10-CM | POA: Diagnosis not present

## 2014-05-02 DIAGNOSIS — Z720 Tobacco use: Secondary | ICD-10-CM | POA: Insufficient documentation

## 2014-05-02 DIAGNOSIS — R0789 Other chest pain: Secondary | ICD-10-CM

## 2014-05-02 DIAGNOSIS — W010XXA Fall on same level from slipping, tripping and stumbling without subsequent striking against object, initial encounter: Secondary | ICD-10-CM | POA: Diagnosis not present

## 2014-05-02 DIAGNOSIS — Z8742 Personal history of other diseases of the female genital tract: Secondary | ICD-10-CM | POA: Insufficient documentation

## 2014-05-02 DIAGNOSIS — Y9289 Other specified places as the place of occurrence of the external cause: Secondary | ICD-10-CM | POA: Diagnosis not present

## 2014-05-02 DIAGNOSIS — Y9389 Activity, other specified: Secondary | ICD-10-CM | POA: Diagnosis not present

## 2014-05-02 DIAGNOSIS — M1990 Unspecified osteoarthritis, unspecified site: Secondary | ICD-10-CM | POA: Diagnosis not present

## 2014-05-02 DIAGNOSIS — Z79899 Other long term (current) drug therapy: Secondary | ICD-10-CM | POA: Insufficient documentation

## 2014-05-02 MED ORDER — HYDROCODONE-ACETAMINOPHEN 5-325 MG PO TABS: ORAL_TABLET | ORAL | Status: DC

## 2014-05-02 MED ORDER — NAPROXEN 250 MG PO TABS: 250.0000 mg | ORAL_TABLET | Freq: Two times a day (BID) | ORAL | Status: DC | PRN

## 2014-05-02 MED ORDER — METHOCARBAMOL 500 MG PO TABS: 1000.0000 mg | ORAL_TABLET | Freq: Four times a day (QID) | ORAL | Status: DC | PRN

## 2014-05-02 NOTE — Discharge Instructions (Signed)
°Emergency Department Resource Guide °1) Find a Doctor and Pay Out of Pocket °Although you won't have to find out who is covered by your insurance plan, it is a good idea to ask around and get recommendations. You will then need to call the office and see if the doctor you have chosen will accept you as a new patient and what types of options they offer for patients who are self-pay. Some doctors offer discounts or will set up payment plans for their patients who do not have insurance, but you will need to ask so you aren't surprised when you get to your appointment. ° °2) Contact Your Local Health Department °Not all health departments have doctors that can see patients for sick visits, but many do, so it is worth a call to see if yours does. If you don't know where your local health department is, you can check in your phone book. The CDC also has a tool to help you locate your state's health department, and many state websites also have listings of all of their local health departments. ° °3) Find a Walk-in Clinic °If your illness is not likely to be very severe or complicated, you may want to try a walk in clinic. These are popping up all over the country in pharmacies, drugstores, and shopping centers. They're usually staffed by nurse practitioners or physician assistants that have been trained to treat common illnesses and complaints. They're usually fairly quick and inexpensive. However, if you have serious medical issues or chronic medical problems, these are probably not your best option. ° °No Primary Care Doctor: °- Call [facility] at  832-8000 - they can help you locate a primary care doctor that  accepts your insurance, provides certain services, etc. °- Physician Referral Service- 1-800-533-3463 ° °Chronic Pain Problems: °Organization         Address  Phone   Notes  °[facility]  (336) 297-2271 Patients need to be referred by their primary care doctor.  ° °Medication  Assistance: °Organization         Address  Phone   Notes  °[place] [facility] [address] °[place], [place] [address] (336) 641-8030 --Must be a resident of [place] °-- Must have NO insurance coverage whatsoever (no Medicaid/ Medicare, etc.) °-- The pt. MUST have a primary care doctor that directs their care regularly and follows them in the community °  °[facility]  (866) 331-1348   °[employer]  (888) 892-1162   ° °Agencies that provide inexpensive medical care: °Organization         Address  Phone   Notes  °[facility]  (336) 832-8035   °[facility]    (336) 832-7272   °[facility] [address] °[place], [place] [address] (336) 832-4777   °[facility] [address], °[place] (336) 271-4999   °[facility]    (336) 373-0678   °[facility]    (336) 272-1050   °[facility] ° [address], [place] Phone:  (336) 832-4444, Fax:  (336) 832-4440 Hours of Operation:  9 am - 6 pm, M-F.  Also accepts Medicaid/Medicare and self-pay.  °[facility] ° [address], [place] Phone: (336) 832-3150, Fax: (336) 832-3151. Hours of Operation:  8:30 am - 5:30 pm, M-F.  Also accepts Medicaid and self-pay.  °[facility] [place] [address], [place] Phone: (336) 878-6027   °[facility] [address], [place], [place] (336)723-1848, Ext. 123 Mondays & Thursdays: 7-9 AM.  First 15 patients are seen on a first come, first serve basis. °  ° °Medicaid-accepting [place] Providers: ° °Organization         Address  Phone   Notes  °[facility] [address], [place] (336) 641-2100 Also accepts self-pay patients.  °[facility] [address], [place] ° (336) 856-9996   °[facility] [address], [place]  (336) 288-8857   °[facility] [address] [place] [address], [place] (336) 299-7000   °Veita Bland [address], [place]  ° (336) 373-1557 Only accepts [place] Access Medicaid patients after they have their name applied to their card.  ° °Self-Pay (no insurance) in [place]: ° °Organization         Address  Phone   Notes  °Sickle Cell Patients, [facility] [address], [place] (336) 832-1970   °[facility] [address], [place] (336) 832-4400   °[facility] ° [address], [place] (336) 992-4800   °[facility]/Dr. Osei-Bonsu ° [address] [place] [address], [place] or [address], [place] (336) 841-8500 Phone number for both [place] and [place] locations is the same.  °[facility] [address], [place] (336) 299-0000   °[facility] [address] [place] [address], [place] or [address] (336) 852-7530 °(336) 878-2260   °[facility] [address], [place] (336) 350-1642, phone; (336) 294-5005, fax Sees patients 1st and 3rd Saturday of every month.  Must not qualify for public or private insurance (i.e. Medicaid, Medicare, [place] Health Choice, Veterans' Benefits) • Household income should be no more than 200% of the poverty level •The clinic cannot treat you if you are pregnant or think you are pregnant • Sexually transmitted diseases are not treated at the clinic.  ° ° °Dental Care: °Organization         Address  Phone  Notes  °[place] [facility] [address],  (336) 641-6152 Accepts children up to age 21 who are enrolled in Medicaid or [place] Health Choice; pregnant women with a Medicaid card; and children who have applied for Medicaid or [place] Health Choice, but were declined, whose parents can pay a reduced fee at time of service.  °[place]  [facility] [place]  [address], [place] (336) 641-7733 Accepts children up to age 21 who are enrolled in Medicaid or  Health Choice; pregnant women with a Medicaid card; and children who have applied for Medicaid or  Health Choice, but were declined, whose parents can pay a reduced fee at time of service.  °[facility] ° [address],  (336) 641-4533 Patients are seen by appointment only. Walk-ins are not accepted. [facility] will see patients 18 years of age and older. °Monday - Tuesday (8am-5pm) °Most Wednesdays (8:30-5pm) °$30 per visit, cash only  °[facility] ° [address], [place] (336) 641-4533 Patients are seen by appointment only. Walk-ins are not accepted. [facility] will see patients 18 years of age and older. °One   Wednesday Evening (Monthly: Volunteer Based).  $30 per visit, cash only  °[facility]  (919) 537-3737 for adults; Children under age 4, call Graduate Pediatric Dentistry at (919) [phone]. Children aged 4-14, please call (919) [phone] to request a pediatric application. ° Dental services are provided in all areas of dental care including fillings, crowns and bridges, complete and partial dentures, implants, gum treatment, root canals, and extractions. Preventive care is also provided. Treatment is provided to both adults and children. °Patients are selected via a lottery and there is often a waiting list. °  °[facility] [address], °[place] ° (336) 763-8833 [email or web address] °  °[facility] [address], [place], [place] (336)723-1848, Ext. 123 Second and Fourth Thursday of each month, opens at 6:30 AM; Clinic ends at 9 AM.  Patients are seen on a first-come first-served basis, and a limited number are seen during each clinic.  ° °[facility] ° [address], [place], [place] (336) 723-7904    Eligibility Requirements °You must have lived in [place], [place], or [place] counties for at least the last three months. °  You cannot be eligible for state or federal sponsored healthcare insurance, including Veterans Administration, Medicaid, or Medicare. °  You generally cannot be eligible for healthcare insurance through your employer.  °  How to apply: °Eligibility screenings are held every Tuesday and Wednesday afternoon from 1:00 pm until 4:00 pm. You do not need an appointment for the interview!  °[facility] [address], [place], [place] 336-631-2330   °[facility]  336-342-8273   °[facility]  336-703-3100   °[facility]  336-570-6415   ° °Behavioral Health Resources in the Community: °Intensive Outpatient Programs °Organization         Address  Phone  Notes  °[facility] [address], [place], [place] 336-878-6098   °[facility] [address], [place], [place] 336-832-9800   °[facility] [address], [place], [place] ° 336-882-2125   °[facility] [address],  °[place], [place] 1-800-853-5163 or 336-641-4981   °Substance Abuse Resources °Organization         Address  Phone  Notes  °[facility]  336-882-2125   °[facility]  336-784-9470   °[facility]  336-285-9073   °[facility]  336-845-3988   °Residential & Outpatient Substance Abuse Program  1-800-659-3381   °Psychological Services °Organization         Address  Phone  Notes  °[facility]  336- 832-9600   °[facility]  336- 378-7881   °[facility] [address], [place] 1-800-853-5163 or 336-641-4981   ° °Mobile Crisis Teams °Organization         Address  Phone  Notes  °[facility], Mobile Crisis Care Unit  1-877-626-1772   °Assertive °[facility] ° [address]  [place], [place] 336-834-9664   °Sharon DeEsch [address] °[place]  336-554-5454   ° °Self-Help/Support Groups °Organization         Address  Phone             Notes  °[facility] - variety of support groups  336- 373-1402 Call for more information  °Narcotics Anonymous (NA), [facility] [address], °[place]   2 meetings at this location  ° °  Residential Treatment Programs °Organization         Address  Phone  Notes  °[facility] [address],    °[place]  1-866-801-8205   °[facility] ° [address], [place], [place] 704-293-8524   °[facility] [address], [place] 336-845-3988 Admissions: 8am-3pm M-F  °[facility] [address]    °[place], [place] 336-841-1104   °[facility] [address], [place], [place] 336-379-7146   °[facility] [address]  °[place], [place] 336-285-9073   °[facility] - Intensive Outpatient [address], [place], [place] 336-852-3033   °[facility] [address]  °[place], [place] 1-877-615-2722 or 336-784-9470   °[facility] [address] [place], [place] 336-227-7417 Accepts Medicaid  °[facility] [address]  ° [place] 1-800-659-3381 Substance Abuse/Addiction Treatment  ° °[place] Behavioral Health Resources °Organization         Address  Phone  Notes  °[facility]  (888) 581-9988   °Julie Brannon, PhD [address] [place], [place]   (336) 349-5553 or (336) 951-0000   °[facility]   [address] °[place], [place] (336) 349-4454   °[facility] [address], [place], [place] (336) 342-8316 Insurance/Medicaid/sponsorship through [facility]  °[facility] [address]                                    [place], [place] (336) 342-8316 Therapy/tele-psych/case    °[facility] [address]  ° [place], [place] (336) 349-2233    °Dr. Arfeen  (336) 349-4544   °[facility] [place]  [employer] [place] [facility] 1) [address], [place] °2) [address], [place] °3)  [address], [place] (336) 349-3220 °(336) 342-7768 ° °(336) 342-8140   °[place] [facility] (336) 342-1394 or (336) 342-3537 (After Hours)    ° ° ° °Take the prescriptions as directed.  Apply moist heat or ice to the area(s) of discomfort, for 15 minutes at a time, several times per day for the next few days.  Do not fall asleep on a heating or ice pack.  Call your regular medical doctor today to schedule a follow up appointment this week.  Return to the Emergency Department immediately if worsening. ° °

## 2014-05-02 NOTE — ED Notes (Signed)
Pt reports to ED for right sided lateral rib pain after fall yesterday, states pain is worse with inspiration. Denies head injury.

## 2014-05-02 NOTE — ED Provider Notes (Signed)
CSN: [id number]     Arrival date & time 05/02/14  1243 History   First MD Initiated Contact with Patient 05/02/14 1304     Chief Complaint  Patient presents with  . Rib Pain       HPI Pt was seen at 1310. Per pt, c/o gradual onset and persistence of constant right ribs "pain" since yesterday. Pt states she was stepping with her left foot when she "slipped" and "caught myself with my left hand." Pt states she then "fell to the right." Pain worsens with palpation of the area and body position changes. Denies any other injuries. Denies prodromal symptoms before fall, no syncope, no head injury, no LOC/AMS, no neck or back pain, no abd pain, no N/V/D, no SOB/cough.     Past Medical History  Diagnosis Date  . Smoker     1/2 PPD  . Leiomyoma uteri   . Arthritis    Past Surgical History  Procedure Laterality Date  . Tubal ligation    . Laparoscopic cholecystectomy    . Vein surgery    . Hysteroscopy  11/2000    HYSTEROSCOPIC MYOMECTOMY  . Abdominal hysterectomy  12/11/10    TAH,RSO  . Oophorectomy  12/11/10    TAH,RSO   Family History  Problem Relation Age of Onset  . Hypertension Father   . Heart disease Father   . Hypertension Brother   . Cancer Mother     LUNG  . Cancer Maternal Grandmother   . Breast cancer Maternal Grandmother    History  Substance Use Topics  . Smoking status: Current Every Day Smoker -- 1.00 packs/day for 28 years    Types: Cigarettes  . Smokeless tobacco: Never Used  . Alcohol Use: 0.0 - 0.5 oz/week    0-1 drink(s) per week     Comment: once in a blue moon   OB History   Grav Para Term Preterm Abortions TAB SAB Ect Mult Living   3 3        3      Review of Systems ROS: Statement: All systems negative except as marked or noted in the HPI; Constitutional: Negative for fever and chills. ; ; Eyes: Negative for eye pain, redness and discharge. ; ; ENMT: Negative for ear pain, hoarseness, nasal congestion, sinus pressure and sore throat. ; ;  Cardiovascular: Negative for chest pain, palpitations, diaphoresis, dyspnea and peripheral edema. ; ; Respiratory: Negative for cough, wheezing and stridor. ; ; Gastrointestinal: Negative for nausea, vomiting, diarrhea, abdominal pain, blood in stool, hematemesis, jaundice and rectal bleeding. . ; ; Genitourinary: Negative for dysuria, flank pain and hematuria. ; ; Musculoskeletal: +right ribs pain. Negative for back pain and neck pain. Negative for swelling.; ; Skin: Negative for pruritus, rash, abrasions, blisters, bruising and skin lesion.; ; Neuro: Negative for headache, lightheadedness and neck stiffness. Negative for weakness, altered level of consciousness , altered mental status, extremity weakness, paresthesias, involuntary movement, seizure and syncope.      Allergies  Review of patient's allergies indicates no known allergies.  Home Medications   Prior to Admission medications   Medication Sig Start Date End Date Taking? Authorizing Provider  acyclovir (ZOVIRAX) 400 MG tablet Take 400 mg by mouth 2 (two) times daily.   Yes Historical Provider, MD  CALCIUM PO Take 1 tablet by mouth daily.    Yes Historical Provider, MD  Multiple Vitamins-Iron (MULTIVITAMIN/IRON PO) Take 1 tablet by mouth daily.    Yes Historical Provider, MD   BP 163/95  Pulse 98  Temp(Src) 98.6 F (37 C) (Oral)  Resp 18  SpO2 100%  LMP 08/19/2010 Physical Exam 1315: Physical examination:  Nursing notes reviewed; Vital signs and O2 SAT reviewed;  Constitutional: Well developed, Well nourished, Well hydrated, In no acute distress; Head:  Normocephalic, atraumatic; Eyes: EOMI, PERRL, No scleral icterus; ENMT: Mouth and pharynx normal, Mucous membranes moist; Neck: Supple, Full range of motion, No lymphadenopathy; Cardiovascular: Regular rate and rhythm, No murmur, rub, or gallop; Respiratory: Breath sounds clear & equal bilaterally, No rales, rhonchi, wheezes.  Speaking full sentences with ease, Normal respiratory  effort/excursion; Chest: +right lateral lower ribs tender to palp. No ecchymosis or abrasions, no soft tissue crepitus, no deformity. Movement normal; Abdomen: Soft, Nontender, Nondistended, Normal bowel sounds. No ecchymosis or abrasions.; Genitourinary: No CVA tenderness; Spine:  No midline CS, TS, LS tenderness. No ecchymosis or abrasions.;; Extremities: Pulses normal, No tenderness, No edema, No calf edema or asymmetry.; Neuro: AA&Ox3, Major CN grossly intact.  Speech clear. No gross focal motor or sensory deficits in extremities.; Skin: Color normal, Warm, Dry.   ED Course  Procedures     EKG Interpretation None      MDM  MDM Reviewed: previous chart, nursing note and vitals Interpretation: x-ray   Dg Ribs Unilateral W/chest Right 05/02/2014   CLINICAL DATA:  Right lateral mid chest pain, lower rib pain  EXAM: RIGHT RIBS AND CHEST - 3+ VIEW  COMPARISON:  None.  FINDINGS: No fracture or other bone lesions are seen involving the ribs. There is no evidence of pneumothorax or pleural effusion. Both lungs are clear. Heart size and mediastinal contours are within normal limits.  Mild osteoarthritis of the right shoulder.  IMPRESSION: No acute osseous injury of the right ribs.   Electronically Signed   By: Kathreen Devoid   On: 05/02/2014 13:27     1430:  XR without fx; abd remains benign. Will tx symptomatically at this time. Pt wants to go home now. Pt is also requesting a work note. Dx and testing d/w pt.  Questions answered.  Verb understanding, agreeable to d/c home with outpt f/u.   Francine Graven, DO 05/03/14 [phone]

## 2014-05-20 ENCOUNTER — Encounter (HOSPITAL_COMMUNITY): Payer: Self-pay | Admitting: Emergency Medicine

## 2014-06-06 ENCOUNTER — Ambulatory Visit (INDEPENDENT_AMBULATORY_CARE_PROVIDER_SITE_OTHER): Payer: 59 | Admitting: Physician Assistant

## 2014-06-06 VITALS — BP 138/84 | HR 66 | Temp 98.0°F | Resp 18 | Ht 65.5 in | Wt 180.6 lb

## 2014-06-06 DIAGNOSIS — R194 Change in bowel habit: Secondary | ICD-10-CM

## 2014-06-06 DIAGNOSIS — R109 Unspecified abdominal pain: Secondary | ICD-10-CM

## 2014-06-06 DIAGNOSIS — R198 Other specified symptoms and signs involving the digestive system and abdomen: Secondary | ICD-10-CM

## 2014-06-06 LAB — POCT URINALYSIS DIPSTICK
BILIRUBIN UA: NEGATIVE
Glucose, UA: NEGATIVE
Ketones, UA: NEGATIVE
Leukocytes, UA: NEGATIVE
NITRITE UA: NEGATIVE
Protein, UA: NEGATIVE
SPEC GRAV UA: 1.01
Urobilinogen, UA: 0.2
pH, UA: 7

## 2014-06-06 LAB — CBC WITH DIFFERENTIAL/PLATELET
BASOS ABS: 0 10*3/uL (ref 0.0–0.1)
Basophils Relative: 0 % (ref 0–1)
EOS PCT: 1 % (ref 0–5)
Eosinophils Absolute: 0.1 10*3/uL (ref 0.0–0.7)
HCT: 39.3 % (ref 36.0–46.0)
Hemoglobin: 13.5 g/dL (ref 12.0–15.0)
LYMPHS ABS: 3.5 10*3/uL (ref 0.7–4.0)
LYMPHS PCT: 41 % (ref 12–46)
MCH: 30.8 pg (ref 26.0–34.0)
MCHC: 34.4 g/dL (ref 30.0–36.0)
MCV: 89.5 fL (ref 78.0–100.0)
MPV: 8.9 fL — ABNORMAL LOW (ref 9.4–12.4)
Monocytes Absolute: 0.4 10*3/uL (ref 0.1–1.0)
Monocytes Relative: 5 % (ref 3–12)
Neutro Abs: 4.5 10*3/uL (ref 1.7–7.7)
Neutrophils Relative %: 53 % (ref 43–77)
PLATELETS: 335 10*3/uL (ref 150–400)
RBC: 4.39 MIL/uL (ref 3.87–5.11)
RDW: 13.4 % (ref 11.5–15.5)
WBC: 8.5 10*3/uL (ref 4.0–10.5)

## 2014-06-06 LAB — POCT UA - MICROSCOPIC ONLY
Bacteria, U Microscopic: NEGATIVE
CRYSTALS, UR, HPF, POC: NEGATIVE
Casts, Ur, LPF, POC: NEGATIVE
Mucus, UA: NEGATIVE
Yeast, UA: NEGATIVE

## 2014-06-06 LAB — COMPREHENSIVE METABOLIC PANEL
ALT: 18 U/L (ref 0–35)
AST: 18 U/L (ref 0–37)
Albumin: 3.9 g/dL (ref 3.5–5.2)
Alkaline Phosphatase: 59 U/L (ref 39–117)
BUN: 7 mg/dL (ref 6–23)
CALCIUM: 9.2 mg/dL (ref 8.4–10.5)
CHLORIDE: 106 meq/L (ref 96–112)
CO2: 24 meq/L (ref 19–32)
CREATININE: 0.74 mg/dL (ref 0.50–1.10)
Glucose, Bld: 79 mg/dL (ref 70–99)
Potassium: 4.4 mEq/L (ref 3.5–5.3)
Sodium: 139 mEq/L (ref 135–145)
Total Bilirubin: 0.4 mg/dL (ref 0.2–1.2)
Total Protein: 6.6 g/dL (ref 6.0–8.3)

## 2014-06-06 LAB — LIPASE: LIPASE: 17 U/L (ref 0–75)

## 2014-06-06 MED ORDER — POLYETHYLENE GLYCOL 3350 17 GM/SCOOP PO POWD: 17.0000 g | Freq: Every day | ORAL | Status: DC

## 2014-06-06 MED ORDER — METHOCARBAMOL 500 MG PO TABS: 500.0000 mg | ORAL_TABLET | Freq: Four times a day (QID) | ORAL | Status: DC

## 2014-06-06 MED ORDER — DOCUSATE SODIUM 100 MG PO CAPS: 100.0000 mg | ORAL_CAPSULE | Freq: Two times a day (BID) | ORAL | Status: DC

## 2014-06-06 NOTE — Patient Instructions (Signed)
Your right side pain is not concerning for anything life-threatening.  Your urine results were normal today. I'll let you know about the results of the labs that are looking at your liver and pancreas. I've prescribed the muscle relaxant in case this is due to continued muscle tightness/spasm in that area. It's possible you may be constipated. Please take the colace and the miralax daily for the next week to see if you feel better. If you are not feeling better with these measures please return to clinic for further evaluation.

## 2014-06-06 NOTE — Progress Notes (Signed)
Subjective:    Patient ID: Laurie Chang, female    DOB: [birth year], 48 y.o.   MRN: [id number]  Lamar Blinks, MD  Chief Complaint  Patient presents with  . Flank Pain    On right side. Pt states with the pain she feels nauseous. x1 week. Has noticed changes in BMs. No changes in urination.    Patient Active Problem List   Diagnosis Date Noted  . Cigar smoker motivated to quit 03/14/2013  . Weight gain 03/14/2013  . HSV-2 seropositive 12/06/2012  . Microhematuria 11/22/2011   Prior to Admission medications   Medication Sig Start Date End Date Taking? Authorizing Provider  acyclovir (ZOVIRAX) 400 MG tablet Take 400 mg by mouth 2 (two) times daily.   Yes Historical Provider, MD  CALCIUM PO Take 1 tablet by mouth daily.    Yes Historical Provider, MD  Multiple Vitamins-Iron (MULTIVITAMIN/IRON PO) Take 1 tablet by mouth daily.    Yes Historical Provider, MD  docusate sodium (COLACE) 100 MG capsule Take 1 capsule (100 mg total) by mouth 2 (two) times daily. 06/06/14   Araceli Bouche, PA  methocarbamol (ROBAXIN) 500 MG tablet Take 1 tablet (500 mg total) by mouth 4 (four) times daily. 06/06/14   Araceli Bouche, PA  naproxen (NAPROSYN) 250 MG tablet Take 1 tablet (250 mg total) by mouth 2 (two) times daily as needed for mild pain or moderate pain (take with food). 05/02/14   Francine Graven, DO  polyethylene glycol powder (GLYCOLAX/MIRALAX) powder Take 17 g by mouth daily. 06/06/14   Araceli Bouche, PA   Medications, allergies, past medical history, surgical history, family history, social history and problem list reviewed and updated.  HPI  67 yof with no pertinent PMH presents today complaining of right flank pain.   She was seen in the ED for this issue one month ago. At that time she had fallen onto her right side and strained her muscles. A chest xray was normal and she was sent home with norco and robaxin.   Today she states that the right flank pain resolved after the ED visit  for a few weeks. She took all of her norco and robaxin. Unfortunately it started again approx one week ago. She cannot recall anything specific that caused the pain to come back on. Approx one week ago she awoke and noticed the pain was back on her right flank. Rated about 4/10, described as dull. She thinks that it is located in exact same spot as before. The pain does not radiate but she has had 3 episodes where the pain shoots across to the left side. This only lasts for a few seconds. Pain has been worsening past few days, she describes as 10/10 today. Does not change with meals. She is more comfortable lying still than moving around. Has been nauseated past week along with the pain. Two vomiting episodes three days ago, one episode today. All non bloody non bilious. Denies any fever or chills. She hasn't had much of an appetite past few days but has kept food down.  PSH hysterectomy yrs ago, she is unsure if her right ovary was removed. Hx cholecystectomy yrs ago. She denies any SOB, CP.   Review of Systems No blood in stool, hematuria, hematemesis, jaundice, pruritis, rash.     Objective:   Physical Exam  Constitutional: She is oriented to person, place, and time. She appears well-developed and well-nourished.  Non-toxic appearance. She does not have a sickly appearance. She appears ill. She  appears distressed.  BP 138/84 mmHg  Pulse 66  Temp(Src) 98 F (36.7 C) (Oral)  Resp 18  Ht 5' 5.5" (1.664 m)  Wt 180 lb 9.6 oz (81.92 kg)  BMI 29.59 kg/m2  SpO2 99%  LMP 08/19/2010  Lying in bed in obvious pain. More comfortable lying flat than moving or sitting up.    Eyes: Conjunctivae and EOM are normal. Pupils are equal, round, and reactive to light. No scleral icterus.  Cardiovascular: Normal rate, regular rhythm, S1 normal and S2 normal.  Exam reveals no gallop.   No murmur heard. Pulmonary/Chest: Effort normal and breath sounds normal. No accessory muscle usage. No tachypnea. No  respiratory distress. She has no decreased breath sounds. She has no wheezes. She has no rhonchi. She has no rales.  Abdominal: Soft. Normal appearance, normal aorta and bowel sounds are normal. She exhibits no pulsatile liver and no abdominal bruit. There is no hepatosplenomegaly. There is tenderness in the right upper quadrant. There is no rigidity, no rebound, no guarding, no CVA tenderness, no tenderness at McBurney's point and negative Murphy's sign.  TTP right flank. Below bottom rib. No rash over area. Tender area approx 3x4" in size.   Neurological: She is alert and oriented to person, place, and time.  Psychiatric: She has a normal mood and affect. Her speech is normal.     Results for orders placed or performed in visit on 06/06/14  POCT urinalysis dipstick  Result Value Ref Range   Color, UA yellow    Clarity, UA clear    Glucose, UA neg    Bilirubin, UA neg    Ketones, UA neg    Spec Grav, UA 1.010    Blood, UA small    pH, UA 7.0    Protein, UA neg    Urobilinogen, UA 0.2    Nitrite, UA neg    Leukocytes, UA Negative   POCT UA - Microscopic Only  Result Value Ref Range   WBC, Ur, HPF, POC 0-1    RBC, urine, microscopic 0-2    Bacteria, U Microscopic neg    Mucus, UA neg    Epithelial cells, urine per micros 0-1    Crystals, Ur, HPF, POC neg    Casts, Ur, LPF, POC neg    Yeast, UA neg       Assessment & Plan:   52 yof with no pertinent PMH presents today complaining of right flank pain.   Right flank pain - Plan: POCT urinalysis dipstick, POCT UA - Microscopic Only, CBC with Differential, Comprehensive metabolic panel, Lipase, docusate sodium (COLACE) 100 MG capsule --UA normal, no urinary sx help r/o stone, pyelo --Lung sounds normal no RLL pneumonia --Hx cholecystectomy, cmp and lipase today --Hx hysterectomy --No zoster --Abd aorta normal size, no bruit --Poss due to constipation or muscle strain in area --Robaxin --rtc one week if not improving  Change  in bowel movement - Plan: polyethylene glycol powder (GLYCOLAX/MIRALAX) powder, methocarbamol (ROBAXIN) 500 MG tablet --States she has been having hard stools lately, could be cause of flank pain --Miralax, colace   Julieta Gutting, PA-C Physician Assistant-Certified [facility]  06/06/2014 2:02 PM   Colace if stools hard miralax if not completley reg bms - 1x day

## 2014-06-07 NOTE — Progress Notes (Signed)
I was directly involved with the patient's care and agree with the diagnosis and treatment plan.

## 2014-08-16 ENCOUNTER — Encounter (HOSPITAL_COMMUNITY): Payer: Self-pay | Admitting: Emergency Medicine

## 2014-08-16 ENCOUNTER — Emergency Department (HOSPITAL_COMMUNITY): Payer: 59

## 2014-08-16 ENCOUNTER — Emergency Department (HOSPITAL_COMMUNITY)
Admission: EM | Admit: 2014-08-16 | Discharge: 2014-08-16 | Disposition: A | Discharge status: Home / Self Care | Payer: 59 | Attending: Emergency Medicine | Admitting: Emergency Medicine

## 2014-08-16 DIAGNOSIS — Z9049 Acquired absence of other specified parts of digestive tract: Secondary | ICD-10-CM | POA: Insufficient documentation

## 2014-08-16 DIAGNOSIS — Z90710 Acquired absence of both cervix and uterus: Secondary | ICD-10-CM | POA: Diagnosis not present

## 2014-08-16 DIAGNOSIS — M1990 Unspecified osteoarthritis, unspecified site: Secondary | ICD-10-CM | POA: Insufficient documentation

## 2014-08-16 DIAGNOSIS — Z720 Tobacco use: Secondary | ICD-10-CM | POA: Insufficient documentation

## 2014-08-16 DIAGNOSIS — Z79899 Other long term (current) drug therapy: Secondary | ICD-10-CM | POA: Diagnosis not present

## 2014-08-16 DIAGNOSIS — Z8742 Personal history of other diseases of the female genital tract: Secondary | ICD-10-CM | POA: Insufficient documentation

## 2014-08-16 DIAGNOSIS — R1031 Right lower quadrant pain: Secondary | ICD-10-CM | POA: Insufficient documentation

## 2014-08-16 DIAGNOSIS — Z9851 Tubal ligation status: Secondary | ICD-10-CM | POA: Insufficient documentation

## 2014-08-16 LAB — COMPREHENSIVE METABOLIC PANEL
ALBUMIN: 4.2 g/dL (ref 3.5–5.2)
ALK PHOS: 54 U/L (ref 39–117)
ALT: 18 U/L (ref 0–35)
AST: 17 U/L (ref 0–37)
Anion gap: 8 (ref 5–15)
BUN: 12 mg/dL (ref 6–23)
CALCIUM: 8.9 mg/dL (ref 8.4–10.5)
CHLORIDE: 104 mmol/L (ref 96–112)
CO2: 24 mmol/L (ref 19–32)
Creatinine, Ser: 0.8 mg/dL (ref 0.50–1.10)
GFR calc Af Amer: >90 mL/min (ref >90)
GFR, EST NON AFRICAN AMERICAN: 85 mL/min — AB (ref >90)
Glucose, Bld: 119 mg/dL — ABNORMAL HIGH (ref 70–99)
Potassium: 3.9 mmol/L (ref 3.5–5.1)
SODIUM: 136 mmol/L (ref 135–145)
TOTAL PROTEIN: 7.2 g/dL (ref 6.0–8.3)
Total Bilirubin: 0.6 mg/dL (ref 0.3–1.2)

## 2014-08-16 LAB — CBC WITH DIFFERENTIAL/PLATELET
BASOS ABS: 0 10*3/uL (ref 0.0–0.1)
BASOS PCT: 0 % (ref 0–1)
EOS ABS: 0.1 10*3/uL (ref 0.0–0.7)
EOS PCT: 0 % (ref 0–5)
HEMATOCRIT: 43.5 % (ref 36.0–46.0)
HEMOGLOBIN: 14.8 g/dL (ref 12.0–15.0)
LYMPHS PCT: 11 % — AB (ref 12–46)
Lymphs Abs: 1.7 10*3/uL (ref 0.7–4.0)
MCH: 31.6 pg (ref 26.0–34.0)
MCHC: 34 g/dL (ref 30.0–36.0)
MCV: 92.8 fL (ref 78.0–100.0)
Monocytes Absolute: 0.7 10*3/uL (ref 0.1–1.0)
Monocytes Relative: 5 % (ref 3–12)
NEUTROS ABS: 12.9 10*3/uL — AB (ref 1.7–7.7)
NEUTROS PCT: 84 % — AB (ref 43–77)
Platelets: 352 10*3/uL (ref 150–400)
RBC: 4.69 MIL/uL (ref 3.87–5.11)
RDW: 12.8 % (ref 11.5–15.5)
WBC: 15.4 10*3/uL — ABNORMAL HIGH (ref 4.0–10.5)

## 2014-08-16 LAB — URINE MICROSCOPIC-ADD ON

## 2014-08-16 LAB — URINALYSIS, ROUTINE W REFLEX MICROSCOPIC
BILIRUBIN URINE: NEGATIVE
GLUCOSE, UA: NEGATIVE mg/dL
Ketones, ur: NEGATIVE mg/dL
Leukocytes, UA: NEGATIVE
Nitrite: NEGATIVE
PH: 6.5 (ref 5.0–8.0)
PROTEIN: NEGATIVE mg/dL
SPECIFIC GRAVITY, URINE: 1.018 (ref 1.005–1.030)
UROBILINOGEN UA: 0.2 mg/dL (ref 0.0–1.0)

## 2014-08-16 LAB — WET PREP, GENITAL
Trich, Wet Prep: NONE SEEN
WBC, Wet Prep HPF POC: NONE SEEN
Yeast Wet Prep HPF POC: NONE SEEN

## 2014-08-16 LAB — LIPASE, BLOOD: Lipase: 29 U/L (ref 11–59)

## 2014-08-16 MED ORDER — DIPHENHYDRAMINE HCL 50 MG/ML IJ SOLN
25.0000 mg | Freq: Once | INTRAMUSCULAR | Status: AC
  Administered 2014-08-16: 25 mg via INTRAVENOUS
  Filled 2014-08-16: qty 1

## 2014-08-16 MED ORDER — HYDROMORPHONE HCL 1 MG/ML IJ SOLN
1.0000 mg | Freq: Once | INTRAMUSCULAR | Status: AC
  Administered 2014-08-16: 1 mg via INTRAVENOUS
  Filled 2014-08-16: qty 1

## 2014-08-16 MED ORDER — ONDANSETRON HCL 4 MG/2ML IJ SOLN
4.0000 mg | Freq: Once | INTRAMUSCULAR | Status: AC
  Administered 2014-08-16: 4 mg via INTRAVENOUS
  Filled 2014-08-16: qty 2

## 2014-08-16 MED ORDER — IOHEXOL 300 MG/ML  SOLN
50.0000 mL | Freq: Once | INTRAMUSCULAR | Status: AC | PRN
  Administered 2014-08-16: 50 mL via ORAL

## 2014-08-16 MED ORDER — IOHEXOL 300 MG/ML  SOLN
100.0000 mL | Freq: Once | INTRAMUSCULAR | Status: AC | PRN
  Administered 2014-08-16: 100 mL via INTRAVENOUS

## 2014-08-16 MED ORDER — HYDROCODONE-ACETAMINOPHEN 5-325 MG PO TABS: 1.0000 | ORAL_TABLET | Freq: Four times a day (QID) | ORAL | Status: DC | PRN

## 2014-08-16 NOTE — ED Notes (Signed)
Bed: EG31 Expected date:  Expected time:  Means of arrival:  Comments: Ems

## 2014-08-16 NOTE — ED Notes (Signed)
Bed: WA22 Expected date:  Expected time:  Means of arrival:  Comments: 

## 2014-08-16 NOTE — Discharge Instructions (Signed)

## 2014-08-16 NOTE — ED Provider Notes (Signed)
CSN: [id number]     Arrival date & time 08/16/14  3244 History   First MD Initiated Contact with Patient 08/16/14 [phone]     Chief Complaint  Patient presents with  . Abdominal Pain     (Consider location/radiation/quality/duration/timing/severity/associated sxs/prior Treatment) Patient is a 49 y.o. female presenting with abdominal pain. The history is provided by the patient.  Abdominal Pain Pain location:  RLQ Pain quality: aching   Pain quality: not heavy and not tearing   Pain radiates to:  Does not radiate Pain severity:  Moderate Onset quality:  Gradual Duration:  1 day Timing:  Constant Progression:  Worsening Chronicity:  New Context: not previous surgeries, not recent illness, not recent sexual activity, not suspicious food intake and not trauma   Relieved by:  Nothing Worsened by:  Nothing tried Ineffective treatments:  None tried Associated symptoms: no chills, no cough, no fever, no shortness of breath and no vomiting     Past Medical History  Diagnosis Date  . Smoker     1/2 PPD  . Leiomyoma uteri   . Arthritis    Past Surgical History  Procedure Laterality Date  . Tubal ligation    . Laparoscopic cholecystectomy    . Vein surgery    . Hysteroscopy  11/2000    HYSTEROSCOPIC MYOMECTOMY  . Abdominal hysterectomy  12/11/10    TAH,RSO  . Oophorectomy  12/11/10    TAH,RSO   Family History  Problem Relation Age of Onset  . Hypertension Father   . Heart disease Father   . Hypertension Brother   . Cancer Mother     LUNG  . Cancer Maternal Grandmother   . Breast cancer Maternal Grandmother    History  Substance Use Topics  . Smoking status: Current Every Day Smoker -- 1.00 packs/day for 28 years    Types: Cigarettes  . Smokeless tobacco: Never Used  . Alcohol Use: 0.0 - 0.5 oz/week    0-1 drink(s) per week     Comment: once in a blue moon   OB History    Gravida Para Term Preterm AB TAB SAB Ectopic Multiple Living   3 3        3      Review of  Systems  Constitutional: Negative for fever and chills.  Respiratory: Negative for cough and shortness of breath.   Gastrointestinal: Negative for vomiting and abdominal pain.  All other systems reviewed and are negative.     Allergies  Review of patient's allergies indicates no known allergies.  Home Medications   Prior to Admission medications   Medication Sig Start Date End Date Taking? Authorizing Provider  acyclovir (ZOVIRAX) 400 MG tablet Take 400 mg by mouth 2 (two) times daily.    Historical Provider, MD  CALCIUM PO Take 1 tablet by mouth daily.     Historical Provider, MD  docusate sodium (COLACE) 100 MG capsule Take 1 capsule (100 mg total) by mouth 2 (two) times daily. 06/06/14   Araceli Bouche, PA  methocarbamol (ROBAXIN) 500 MG tablet Take 1 tablet (500 mg total) by mouth 4 (four) times daily. 06/06/14   Araceli Bouche, PA  Multiple Vitamins-Iron (MULTIVITAMIN/IRON PO) Take 1 tablet by mouth daily.     Historical Provider, MD  naproxen (NAPROSYN) 250 MG tablet Take 1 tablet (250 mg total) by mouth 2 (two) times daily as needed for mild pain or moderate pain (take with food). 05/02/14   Francine Graven, DO  polyethylene glycol powder (GLYCOLAX/MIRALAX) powder Take  17 g by mouth daily. 06/06/14   Todd McVeigh, PA   BP 142/74 mmHg  Pulse 68  Temp(Src) 98.2 F (36.8 C) (Oral)  Resp 16  SpO2 97%  LMP 08/19/2010 Physical Exam  Constitutional: She is oriented to person, place, and time. She appears well-developed and well-nourished. No distress.  HENT:  Head: Normocephalic and atraumatic.  Mouth/Throat: Oropharynx is clear and moist.  Eyes: EOM are normal. Pupils are equal, round, and reactive to light.  Neck: Normal range of motion. Neck supple.  Cardiovascular: Normal rate and regular rhythm.  Exam reveals no friction rub.   No murmur heard. Pulmonary/Chest: Effort normal and breath sounds normal. No respiratory distress. She has no wheezes. She has no rales.  Abdominal:  Soft. She exhibits no distension. There is tenderness (RLQ). There is rebound (RLQ).  Musculoskeletal: Normal range of motion. She exhibits no edema.  Neurological: She is alert and oriented to person, place, and time. She exhibits normal muscle tone.  Skin: No rash noted. She is not diaphoretic.  Nursing note and vitals reviewed.   ED Course  Procedures (including critical care time) Labs Review Labs Reviewed  CBC WITH DIFFERENTIAL/PLATELET - Abnormal; Notable for the following:    WBC 15.4 (*)    Neutrophils Relative % 84 (*)    Neutro Abs 12.9 (*)    Lymphocytes Relative 11 (*)    All other components within normal limits  COMPREHENSIVE METABOLIC PANEL  LIPASE, BLOOD  URINALYSIS, ROUTINE W REFLEX MICROSCOPIC    Imaging Review Ct Abdomen Pelvis W Contrast  08/16/2014   CLINICAL DATA:  Right lower quadrant significant pain, vomiting, status post hysterectomy, status postcholecystectomy  EXAM: CT ABDOMEN AND PELVIS WITH CONTRAST  TECHNIQUE: Multidetector CT imaging of the abdomen and pelvis was performed using the standard protocol following bolus administration of intravenous contrast.  CONTRAST:  147mL OMNIPAQUE IOHEXOL 300 MG/ML  SOLN  COMPARISON:  12/07/2010  FINDINGS: Lung bases are unremarkable. Sagittal images of the spine shows degenerative changes lower thoracic and upper lumbar spine.  The patient is status postcholecystectomy. Liver, pancreas, spleen and adrenal glands are unremarkable.  Kidneys are symmetrical in enhancement. There is mild right hydronephrosis and right hydroureter. No calcified right ureteral calculi are noted. Subtle mild right perinephric stranding. Findings may be due to a recent passed right ureteral calculus, right urinary tract inflammation or distal right ureteral stricture. Correlation with urology exam is recommended.  Delayed renal images shows bilateral renal symmetrical excretion. Persistent mild right hydronephrosis and hydroureter on delayed images.   No aortic aneurysm.  No small bowel obstruction. No ascites or free air. No adenopathy. Normal retrocecal appendix noted in axial image 52. There is no pericecal inflammation.  Few diverticula are noted in descending colon. Multiple sigmoid colon diverticula. No evidence of acute diverticulitis. No calcified calculi are noted within moderate distended urinary bladder. The patient is status post hysterectomy. Small nonspecific bilateral inguinal lymph nodes. No destructive bony lesions are noted within pelvis.  Degenerative changes bilateral SI joints.  IMPRESSION: 1. There is mild right hydronephrosis and right hydroureter. No calcified right ureteral calculi are noted. Minimal right perinephric stranding. Findings may be due to recent passed right ureteral calculus, right urinary tract inflammation or distal ureteral stricture. Clinical correlation is necessary. Delay renal images shows bilateral renal symmetrical excretion. Persistent mild right hydronephrosis. 2. Normal retrocecal appendix.  No pericecal inflammation. 3. No small bowel obstruction. 4. Degenerative changes lower thoracic, lumbar spine and bilateral SI joints.   Electronically Signed  By: Lahoma Crocker [name]   On: 08/16/2014 12:25     EKG Interpretation None      MDM   Final diagnoses:  RLQ abdominal pain    67F here with RLQ pain. Began last night, worsened this morning. Associated N/V. No diarrhea. On exam, RLQ pain with rebound tenderness. Will scan for possible appendicitis. CT showed likely she just passed a stone. Normal appendix. Instructed to f/u with PCP.   Evelina Bucy, MD 08/16/14 [phone]

## 2014-08-16 NOTE — ED Notes (Signed)
Per EMS, pt from [facility].  Pt c/o severe RLQ pain.  Woke up at 7 am with the pain but pain worsened and she vomited.  HX hysterectomy, gallbladder removal.  150 mcg Fentanyl given.  Vitals: 178/98, hr 86, 99% ra.  Pain 4/10

## 2014-08-19 LAB — GC/CHLAMYDIA PROBE AMP (~~LOC~~) NOT AT ARMC
Chlamydia: NEGATIVE
Neisseria Gonorrhea: NEGATIVE

## 2014-11-15 ENCOUNTER — Ambulatory Visit (INDEPENDENT_AMBULATORY_CARE_PROVIDER_SITE_OTHER): Payer: BLUE CROSS/BLUE SHIELD | Admitting: Family Medicine

## 2014-11-15 VITALS — BP 134/80 | HR 99 | Temp 98.3°F | Resp 16 | Ht 65.5 in | Wt 173.0 lb

## 2014-11-15 DIAGNOSIS — R894 Abnormal immunological findings in specimens from other organs, systems and tissues: Secondary | ICD-10-CM

## 2014-11-15 DIAGNOSIS — S29012A Strain of muscle and tendon of back wall of thorax, initial encounter: Secondary | ICD-10-CM | POA: Diagnosis not present

## 2014-11-15 DIAGNOSIS — F17200 Nicotine dependence, unspecified, uncomplicated: Secondary | ICD-10-CM

## 2014-11-15 DIAGNOSIS — Z720 Tobacco use: Secondary | ICD-10-CM | POA: Diagnosis not present

## 2014-11-15 DIAGNOSIS — R768 Other specified abnormal immunological findings in serum: Secondary | ICD-10-CM

## 2014-11-15 MED ORDER — ACYCLOVIR 400 MG PO TABS: 400.0000 mg | ORAL_TABLET | Freq: Every day | ORAL | Status: DC

## 2014-11-15 MED ORDER — TRAMADOL HCL 50 MG PO TABS: 50.0000 mg | ORAL_TABLET | Freq: Every evening | ORAL | Status: DC | PRN

## 2014-11-15 MED ORDER — NICOTINE 21 MG/24HR TD PT24: 21.0000 mg | MEDICATED_PATCH | Freq: Every day | TRANSDERMAL | Status: DC

## 2014-11-15 NOTE — Progress Notes (Signed)
[facility] [address], [place] [address] [phone]  Date:  11/15/2014   Name:  Laurie Chang   DOB:  [birth year]   MRN:  [id number]  PCP:  Lamar Blinks, MD    Chief Complaint: Back Pain and Medication Refill   History of Present Illness:  Laurie Chang is a 49 y.o. very pleasant female patient who presents with the following:  Last seen by myself about 2 years ago.  She notes left mid-lower back pain.  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  It has woken her up at night which is shy she came in; she would like some tramadol for pain She has not noted any urinary sx-no urinary burning, frequency, or blood.   No radiation to her legs. No leg weakness or numbness.   She has used tramadol in the past for pain- she has taken it only at bedtime for knee pain.   She also needs a refill of her acyclovir.  It is working well for her.  She takes it daily and does not have any outbreaks with this regimen.   She is a smoker and would like to try some nicotine patches in hopes of quitting. She did try chantix but she did not like it; it caused bad dreams   Patient Active Problem List   Diagnosis Date Noted  . Cigar smoker motivated to quit 03/14/2013  . Weight gain 03/14/2013  . HSV-2 seropositive 12/06/2012  . Microhematuria 11/22/2011    Past Medical History  Diagnosis Date  . Smoker     1/2 PPD  . Leiomyoma uteri   . Arthritis     Past Surgical History  Procedure Laterality Date  . Tubal ligation    . Laparoscopic cholecystectomy    . Vein surgery    . Hysteroscopy  11/2000    HYSTEROSCOPIC MYOMECTOMY  . Abdominal hysterectomy  12/11/10    TAH,RSO  . Oophorectomy  12/11/10    TAH,RSO    History  Substance Use Topics  . Smoking status: Current Every Day Smoker -- 1.00 packs/day for 28 years    Types: Cigarettes  . Smokeless tobacco: Never Used  . Alcohol Use: 0.0 - 0.5 oz/week    0-1 drink(s) per week      Comment: once in a blue moon    Family History  Problem Relation Age of Onset  . Hypertension Father   . Heart disease Father   . Hypertension Brother   . Cancer Mother     LUNG  . Cancer Maternal Grandmother   . Breast cancer Maternal Grandmother     No Known Allergies  Medication list has been reviewed and updated.  Current Outpatient Prescriptions on File Prior to Visit  Medication Sig Dispense Refill  . acyclovir (ZOVIRAX) 400 MG tablet Take 400 mg by mouth 2 (two) times daily.    Marland Kitchen CALCIUM PO Take 1 tablet by mouth daily.     . Multiple Vitamins-Iron (MULTIVITAMIN/IRON PO) Take 1 tablet by mouth daily.     Marland Kitchen docusate sodium (COLACE) 100 MG capsule Take 1 capsule (100 mg total) by mouth 2 (two) times daily. (Patient not taking: Reported on 08/16/2014) 10 capsule 0  . HYDROcodone-acetaminophen (NORCO/VICODIN) 5-325 MG per tablet Take 1 tablet by mouth every 6 (six) hours as needed for moderate pain. (Patient not taking: Reported on 11/15/2014) 20 tablet 0  . methocarbamol (ROBAXIN) 500 MG tablet Take 1 tablet (500 mg  total) by mouth 4 (four) times daily. (Patient not taking: Reported on 08/16/2014) 30 tablet 0  . naproxen (NAPROSYN) 250 MG tablet Take 1 tablet (250 mg total) by mouth 2 (two) times daily as needed for mild pain or moderate pain (take with food). (Patient not taking: Reported on 08/16/2014) 14 tablet 0  . polyethylene glycol powder (GLYCOLAX/MIRALAX) powder Take 17 g by mouth daily. (Patient not taking: Reported on 08/16/2014) 3350 g 1   No current facility-administered medications on file prior to visit.    Review of Systems:  As per HPI- otherwise negative.   Physical Examination: Filed Vitals:   11/15/14 1327  BP: 134/80  Pulse: 99  Temp: 98.3 F (36.8 C)  Resp: 16   Filed Vitals:   11/15/14 1327  Height: 5' 5.5" (1.664 m)  Weight: 173 lb (78.472 kg)   Body mass index is 28.34 kg/(m^2). Ideal Body Weight: Weight in (lb) to have BMI = 25:  152.2  GEN: WDWN, NAD, Non-toxic, A & O x 3, history of smoking HEENT: Atraumatic, Normocephalic. Neck supple. No masses, No LAD.   Ears and Nose: No external deformity. CV: RRR, No M/G/R. No JVD. No thrill. No extra heart sounds. PULM: CTA B, no wheezes, crackles, rhonchi. No retractions. No resp. distress. No accessory muscle use. ABD: S, NT, ND. No rebound. No HSM. She is tender on the left side of her posterior bra line.  No rash to indicate shingles Normal BLE strength, sensation and DTR, negative SLR bilaterally  EXTR: No c/c/e NEURO Normal gait.  PSYCH: Normally interactive. Conversant. Not depressed or anxious appearing.  Calm demeanor.    Assessment and Plan: Muscle strain of left upper back, initial encounter - Plan: traMADol (ULTRAM) 50 MG tablet  Tobacco use disorder - Plan: nicotine (NICODERM CQ - DOSED IN MG/24 HOURS) 21 mg/24hr patch  HSV-2 seropositive - Plan: acyclovir (ZOVIRAX) 400 MG tablet  See pt instructions She may use tramadol at night   Signed Lamar Blinks, MD

## 2014-11-15 NOTE — Patient Instructions (Signed)
Continue to use your acyclovir as needed for suppression Use the tramadol for your back pain.  However please do follow-up if your pain does not go away in one week Good luck with quitting smoking!  I hope that the patches are helpful for you.  You can cut them smaller to taper your dose  You can schedule a colonoscopy with the GI doctor of your choice.  Let me know if you do need a referral  Recently, a new recommendation has been made regarding screening for lung cancer using annual "low dose" CT scanning.  This service is recommended for people who are 39- 50 years old, who currently smoke or quit in the last 15 years, and who smoked at least a pack per day for 30 years or more.    Some patients who are at least 51 years old and who smoked a pack per day for 20 years, or were exposed to second hand smoke may also qualify for screening.    In [place] this service is available at [facility], [phone]. The exam costs about $300 but may be covered by insurance.

## 2015-01-08 ENCOUNTER — Ambulatory Visit (INDEPENDENT_AMBULATORY_CARE_PROVIDER_SITE_OTHER): Payer: BLUE CROSS/BLUE SHIELD | Admitting: Family Medicine

## 2015-01-08 VITALS — BP 156/82 | HR 110 | Temp 99.0°F | Resp 18 | Ht 65.0 in | Wt 174.8 lb

## 2015-01-08 DIAGNOSIS — J0101 Acute recurrent maxillary sinusitis: Secondary | ICD-10-CM | POA: Diagnosis not present

## 2015-01-08 DIAGNOSIS — J209 Acute bronchitis, unspecified: Secondary | ICD-10-CM | POA: Diagnosis not present

## 2015-01-08 MED ORDER — HYDROCODONE-HOMATROPINE 5-1.5 MG/5ML PO SYRP: 5.0000 mL | ORAL_SOLUTION | Freq: Three times a day (TID) | ORAL | Status: DC | PRN

## 2015-01-08 MED ORDER — AMOXICILLIN-POT CLAVULANATE 875-125 MG PO TABS: 1.0000 | ORAL_TABLET | Freq: Two times a day (BID) | ORAL | Status: DC

## 2015-01-08 NOTE — Patient Instructions (Signed)

## 2015-01-08 NOTE — Progress Notes (Signed)
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  The symptoms are keeping her awake at night and associated with low grade (100 degree) fever.  Some wheezing but no shortness of breath or chest pain.  Patient denies hemoptysis or epistaxis.  She is working on quitting cigarettes and has reduced her use.  Objective:  NAD but has significant voice change with congested cough. BP 156/82 mmHg  Pulse 110  Temp(Src) 99 F (37.2 C) (Oral)  Resp 18  Ht 5\' 5"  (1.651 m)  Wt 174 lb 12.8 oz (79.289 kg)  BMI 29.09 kg/m2  SpO2 99%  LMP 08/19/2010 HEENT:  Some white retro-tympanic fluid bilat, nasal passages narrowed with mucopurulent discharge, missing few teeth. Chest:  No rales, few exp wheezes Heart:  Reg, no murmur Ext:  No edema Skin:  No rash Gait:  Stable, normal affect  Assessment:  Sinusitis and bronchitis, acute without evidence for serious disease.  Plan:  Antibiotics, afrin and time off from work     ICD-9-CM ICD-10-CM   1. Acute recurrent maxillary sinusitis 461.0 J01.01 amoxicillin-clavulanate (AUGMENTIN) 875-125 MG per tablet  2. Acute bronchitis, unspecified organism 466.0 J20.9 HYDROcodone-homatropine (HYCODAN) 5-1.5 MG/5ML syrup     Signed, Robyn Haber, MD

## 2015-03-03 ENCOUNTER — Ambulatory Visit (INDEPENDENT_AMBULATORY_CARE_PROVIDER_SITE_OTHER): Payer: BLUE CROSS/BLUE SHIELD | Admitting: Family Medicine

## 2015-03-03 VITALS — BP 140/86 | HR 87 | Temp 97.8°F | Resp 16 | Ht 65.0 in | Wt 173.0 lb

## 2015-03-03 DIAGNOSIS — H938X3 Other specified disorders of ear, bilateral: Secondary | ICD-10-CM

## 2015-03-03 DIAGNOSIS — R42 Dizziness and giddiness: Secondary | ICD-10-CM

## 2015-03-03 LAB — GLUCOSE, POCT (MANUAL RESULT ENTRY): POC GLUCOSE: 70 mg/dL (ref 70–99)

## 2015-03-03 MED ORDER — PREDNISONE 20 MG PO TABS: ORAL_TABLET | ORAL | Status: DC

## 2015-03-03 NOTE — Progress Notes (Addendum)
Subjective:    Patient ID: Laurie Chang, female    DOB: [birth year], 49 y.o.   MRN: [id number]  Chief Complaint  Patient presents with  . Dizziness    Onset yesterday  . Ear Fullness   Medications, allergies, past medical history, surgical history, family history, social history and problem list reviewed and updated.  HPI  49 yof presents with dizziness, vertigo, and ear fullness.   Sx started yest when first stood up from bed. Felt both lightheaded and that the room was spinning. Constant since onset yest morning. Unable to work yest or today. Had to pull over while driving yest as was intense. Also mentions bilateral ear fullness since yest afternoon. Vertigo does not change with position.   Mild HA yest which is still present but has improved today. Had uri sx 6 wks ago. No hx htn. Mild elevated ldl. Current smoker 30 ppy hx.  No tinnitus Besides smoking she is generally in good heatlh  Review of Systems No fevers, chills, vision changes, cp, sob, palps.     Objective:   Physical Exam  Constitutional: She is oriented to person, place, and time. She appears well-developed and well-nourished.  Non-toxic appearance. She does not have a sickly appearance. She does not appear ill. No distress.  BP 140/86 mmHg  Pulse 87  Temp(Src) 97.8 F (36.6 C) (Oral)  Resp 16  Ht 5\' 5"  (1.651 m)  Wt 173 lb (78.472 kg)  BMI 28.79 kg/m2  SpO2 98%  LMP 08/19/2010   HENT:  Right Ear: Tympanic membrane normal.  Left Ear: Tympanic membrane normal.  Mouth/Throat: Uvula is midline, oropharynx is clear and moist and mucous membranes are normal.  Eyes: Conjunctivae and EOM are normal. Pupils are equal, round, and reactive to light. Right eye exhibits no nystagmus. Left eye exhibits no nystagmus.  Cardiovascular: Normal rate, regular rhythm and normal heart sounds.  Exam reveals no gallop.   No murmur heard. Neurological: She is alert and oriented to person, place, and time. She has normal  strength. No cranial nerve deficit or sensory deficit.  Normal rapid alternating movements, heel to shin. Positive dix halpike to right.   Psychiatric: She has a normal mood and affect. Her speech is normal and behavior is normal.   EKG read by Dr Lorelei Pont. Findings: Normal SR, no concerning findings.    Results for orders placed or performed in visit on 03/03/15  POCT glucose (manual entry)  Result Value Ref Range   POC Glucose 70 70 - 99 mg/dl      Assessment & Plan:   Dizziness - Plan: EKG 12-Lead, POCT glucose (manual entry)  Vertigo - Plan: MR Brain W Wo Contrast, predniSONE (DELTASONE) 20 MG tablet  Ear fullness, bilateral  Here today with sensation of vertigo and lightheadedness, constatnt since yesterday.  Not typical of BPPV- most likely vestibular neuritis however also need to rule- out more serious disease.  Of note she did have a viral illness about 6 weeks ago.  Decided to pursue MRI tomorrow. Will treat with prednisone now as vestibular neuritis is most likely.  Denny Peon, MD.  Visit started by Araceli Bouche, who consulted with me regarding care of this lady who is my primary care pt.    Meds ordered this encounter  Medications  . predniSONE (DELTASONE) 20 MG tablet    Sig: Take 3 pills a day for 2 days, 2 pills a day for 2 days and 1 pill a day for 2 days  Dispense:  12 tablet    Refill:  0    --  Julieta Gutting, PA-C Physician Assistant-Certified [facility]  03/03/2015 2:23 PM  Called to check on the status of he MRI today.  Jerri states that she is still waiting to hear if this is approved by her insurance, however she is less worried now as her sx have "disappeared."  This is certainly good news, will look into her MRI again when I am in the office tomorrow

## 2015-03-03 NOTE — Patient Instructions (Signed)
We will call you as soon as we hear from the neurologist about the next step

## 2015-03-04 ENCOUNTER — Telehealth: Payer: Self-pay | Admitting: Family Medicine

## 2015-03-04 NOTE — Telephone Encounter (Signed)
Called and LMOM- is she set up for her MRI? Please let me know if any problems

## 2015-03-06 ENCOUNTER — Other Ambulatory Visit: Payer: Self-pay | Admitting: Family Medicine

## 2015-03-09 ENCOUNTER — Other Ambulatory Visit: Payer: 59

## 2015-03-09 ENCOUNTER — Telehealth: Payer: Self-pay | Admitting: Family Medicine

## 2015-03-09 DIAGNOSIS — R42 Dizziness and giddiness: Secondary | ICD-10-CM

## 2015-03-09 NOTE — Telephone Encounter (Signed)
Called to check on her- she states that she was still not able to get her MRI approved. She would like to see neurology to see of they can help and perhaps get the MRI.  She has the vertigo sx just occasionally now, will contact us if any other concerns

## 2015-03-28 ENCOUNTER — Ambulatory Visit (INDEPENDENT_AMBULATORY_CARE_PROVIDER_SITE_OTHER): Payer: BLUE CROSS/BLUE SHIELD | Admitting: Diagnostic Neuroimaging

## 2015-03-28 ENCOUNTER — Encounter: Payer: Self-pay | Admitting: Diagnostic Neuroimaging

## 2015-03-28 VITALS — BP 151/90 | HR 74 | Ht 66.0 in | Wt 175.5 lb

## 2015-03-28 DIAGNOSIS — R42 Dizziness and giddiness: Secondary | ICD-10-CM

## 2015-03-28 NOTE — Patient Instructions (Signed)
Monitor symptoms. 

## 2015-03-28 NOTE — Progress Notes (Signed)
[facility]  PATIENT: Laurie Chang DOB: 29-Feb-1966  REFERRING CLINICIAN: J Copeland HISTORY FROM: patient  REASON FOR VISIT: new consult    HISTORICAL  CHIEF COMPLAINT:  Chief Complaint  Patient presents with  . Referral    from internal referral    HISTORY OF PRESENT ILLNESS:   49 year old right-handed female here for evaluation of dizziness. August 2016 patient had onset of feeling off balance, intermittent dizziness, intermittent spinning. Symptoms occur with sitting or standing. No nausea, slurred speech, numbness or weakness. Patient has some left neck pain.  No headaches, traumas, chest pain or shortness of breath.  No aggravating or alleviating factors.   REVIEW OF SYSTEMS: Full 14 system review of systems performed and notable only for As per history of present illness otherwise negative.  ALLERGIES: No Known Allergies  HOME MEDICATIONS: Outpatient Prescriptions Prior to Visit  Medication Sig Dispense Refill  . acyclovir (ZOVIRAX) 400 MG tablet Take 1 tablet (400 mg total) by mouth daily. 180 tablet 3  . aspirin 81 MG tablet Take 81 mg by mouth daily.    Marland Kitchen CALCIUM PO Take 1 tablet by mouth daily.     . Multiple Vitamins-Iron (MULTIVITAMIN/IRON PO) Take 1 tablet by mouth daily.     . nicotine (NICODERM CQ - DOSED IN MG/24 HOURS) 21 mg/24hr patch Place 1 patch (21 mg total) onto the skin daily. 28 patch 3  . Omega-3 Fatty Acids (FISH OIL BURP-LESS) 1000 MG CAPS Take by mouth.    . traMADol (ULTRAM) 50 MG tablet TAKE 1 TABLET AT BEDTIME AS NEEDED 40 tablet 0  . vitamin B-12 (CYANOCOBALAMIN) 100 MCG tablet Take 100 mcg by mouth daily.    . predniSONE (DELTASONE) 20 MG tablet Take 3 pills a day for 2 days, 2 pills a day for 2 days and 1 pill a day for 2 days (Patient not taking: Reported on 03/28/2015) 12 tablet 0   No facility-administered medications prior to visit.    PAST MEDICAL HISTORY: Past Medical History  Diagnosis Date  .  Smoker     1/2 PPD  . Leiomyoma uteri   . Arthritis   . Syncope and collapse     PAST SURGICAL HISTORY: Past Surgical History  Procedure Laterality Date  . Tubal ligation    . Laparoscopic cholecystectomy    . Vein surgery    . Hysteroscopy  11/2000    HYSTEROSCOPIC MYOMECTOMY  . Abdominal hysterectomy  12/11/10    TAH,RSO  . Oophorectomy  12/11/10    TAH,RSO    FAMILY HISTORY: Family History  Problem Relation Age of Onset  . Hypertension Father   . Heart disease Father   . Hypertension Brother   . Cancer Mother     LUNG  . Cancer Maternal Grandmother   . Breast cancer Maternal Grandmother     SOCIAL HISTORY:  Social History   Social History  . Marital Status: Widowed    Spouse Name: N/A  . Number of Children: 3  . Years of Education: 12   Occupational History  . [employer]     [facility]   Social History Main Topics  . Smoking status: Current Every Day Smoker -- 1.00 packs/day for 28 years    Types: Cigarettes  . Smokeless tobacco: Never Used  . Alcohol Use: 0.0 - 0.5 oz/week    0-1 Standard drinks or equivalent per week     Comment: once in a blue moon  . Drug Use: No  .  Sexual Activity:    Partners: Male    Patent examiner Protection: Surgical   Other Topics Concern  . Not on file   Social History Narrative   Widowed in 2012.  Lives with her daughter and two granddaughters.     PHYSICAL EXAM  GENERAL EXAM/CONSTITUTIONAL: Vitals:  Filed Vitals:   03/28/15 0942 03/28/15 0952 03/28/15 0954  BP: 141/88 146/93 151/90  Pulse: 77 93 74  Height: 5\' 6"  (1.676 m)    Weight: 175 lb 8 oz (79.606 kg)       Body mass index is 28.34 kg/(m^2).  No exam data present  Patient is in no distress; well developed, nourished and groomed; neck is supple  DIX HALL PIKE NEGATIVE  CARDIOVASCULAR:  Examination of carotid arteries is normal; no carotid bruits  Regular rate and rhythm, no murmurs  Examination of peripheral vascular system  by observation and palpation is normal  EYES:  Ophthalmoscopic exam of optic discs and posterior segments is normal; no papilledema or hemorrhages  MUSCULOSKELETAL:  Gait, strength, tone, movements noted in Neurologic exam below  NEUROLOGIC: MENTAL STATUS:  No flowsheet data found.  awake, alert, oriented to person, place and time  recent and remote memory intact  normal attention and concentration  language fluent, comprehension intact, naming intact,   fund of knowledge appropriate  CRANIAL NERVE:   2nd - no papilledema on fundoscopic exam  2nd, 3rd, 4th, 6th - pupils equal and reactive to light, visual fields full to confrontation, extraocular muscles intact, no nystagmus  5th - facial sensation symmetric  7th - facial strength symmetric  8th - hearing intact  9th - palate elevates symmetrically, uvula midline  11th - shoulder shrug symmetric  12th - tongue protrusion midline  MOTOR:   normal bulk and tone, full strength in the BUE, BLE  SENSORY:   normal and symmetric to light touch, pinprick, temperature, vibration  COORDINATION:   finger-nose-finger, fine finger movements normal  REFLEXES:   deep tendon reflexes present and symmetric  GAIT/STATION:   narrow based gait; able to walk tandem; romberg is negative    DIAGNOSTIC DATA (LABS, IMAGING, TESTING) - I reviewed patient records, labs, notes, testing and imaging myself where available.  Lab Results  Component Value Date   WBC 15.4* 08/16/2014   HGB 14.8 08/16/2014   HCT 43.5 08/16/2014   MCV 92.8 08/16/2014   PLT 352 08/16/2014      Component Value Date/Time   NA 136 08/16/2014 0955   K 3.9 08/16/2014 0955   CL 104 08/16/2014 0955   CO2 24 08/16/2014 0955   GLUCOSE 119* 08/16/2014 0955   BUN 12 08/16/2014 0955   CREATININE 0.80 08/16/2014 0955   CREATININE 0.74 06/06/2014 1242   CALCIUM 8.9 08/16/2014 0955   PROT 7.2 08/16/2014 0955   ALBUMIN 4.2 08/16/2014 0955   AST 17  08/16/2014 0955   ALT 18 08/16/2014 0955   ALKPHOS 54 08/16/2014 0955   BILITOT 0.6 08/16/2014 0955   GFRNONAA 85* 08/16/2014 0955   GFRAA >90 08/16/2014 0955   Lab Results  Component Value Date   CHOL 181 11/17/2011   HDL 38* 11/17/2011   LDLCALC 118* 11/17/2011   TRIG 125 11/17/2011   CHOLHDL 4.8 11/17/2011   No results found for: HGBA1C No results found for: VITAMINB12 Lab Results  Component Value Date   TSH 1.631 03/14/2013       ASSESSMENT AND PLAN  49 y.o. year old female here with intermittent vertigo, now resolved.  May represent postviral labyrinthitis or other peripheral vestibulopathy. Neurologic examination normal.   Dx: peripheral vestibulopathy (post-viral, BPPV, labrynthitis)  PLAN: - symptoms have resolved; neuro exam normal; monitor symptoms - if vertigo returns or changes, then will check MRI brain with IAC protocol  Return if symptoms worsen or fail to improve, for return to PCP.    Penni Bombard, MD 09/18/4399, 02:72 AM Certified in Neurology, Neurophysiology and Neuroimaging  [facility] [address], [place],  [address] [phone]

## 2015-04-13 ENCOUNTER — Telehealth: Payer: Self-pay
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
## 2015-04-14 NOTE — Telephone Encounter (Signed)
Spoke with pt advised her to get a police report and bring it to Dr. Lorelei Pont. Pt understood.

## 2015-04-16 ENCOUNTER — Telehealth: Payer: Self-pay | Admitting: Family Medicine

## 2015-04-16 MED ORDER — TRAMADOL HCL 50 MG PO TABS: 50.0000 mg | ORAL_TABLET | Freq: Every evening | ORAL | Status: DC | PRN
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
## 2015-07-15 ENCOUNTER — Other Ambulatory Visit: Payer: Self-pay | Admitting: Family Medicine

## 2015-08-08 ENCOUNTER — Encounter: Payer: Self-pay | Admitting: Family Medicine

## 2015-08-13 ENCOUNTER — Encounter: Payer: Self-pay | Admitting: Family Medicine

## 2015-12-18 ENCOUNTER — Other Ambulatory Visit: Payer: Self-pay

## 2015-12-18 DIAGNOSIS — R768 Other specified abnormal immunological findings in serum: Secondary | ICD-10-CM

## 2015-12-18 MED ORDER — ACYCLOVIR 400 MG PO TABS: 400.0000 mg | ORAL_TABLET | Freq: Every day | ORAL | Status: DC

## 2016-12-01 ENCOUNTER — Encounter: Payer: Self-pay | Admitting: Gynecology

## 2018-03-15 DIAGNOSIS — L0291 Cutaneous abscess, unspecified: Secondary | ICD-10-CM | POA: Diagnosis not present

## 2018-08-23 ENCOUNTER — Encounter: Payer: Self-pay | Admitting: Family Medicine

## 2018-08-28 ENCOUNTER — Ambulatory Visit: Payer: 59 | Admitting: Medical

## 2018-09-28 ENCOUNTER — Ambulatory Visit: Payer: 59 | Admitting: Medical

## 2018-12-04 ENCOUNTER — Ambulatory Visit (HOSPITAL_BASED_OUTPATIENT_CLINIC_OR_DEPARTMENT_OTHER)
Admission: RE | Admit: 2018-12-04 | Discharge: 2018-12-04 | Disposition: A | Discharge status: Home / Self Care | Payer: BLUE CROSS/BLUE SHIELD | Source: Ambulatory Visit | Attending: Medical | Admitting: Medical

## 2018-12-04 ENCOUNTER — Encounter: Payer: Self-pay | Admitting: Medical

## 2018-12-04 ENCOUNTER — Other Ambulatory Visit: Payer: Self-pay

## 2018-12-04 ENCOUNTER — Ambulatory Visit (INDEPENDENT_AMBULATORY_CARE_PROVIDER_SITE_OTHER): Payer: BLUE CROSS/BLUE SHIELD | Admitting: Medical

## 2018-12-04 VITALS — BP 170/90 | HR 100 | Temp 98.6°F | Ht 65.0 in | Wt 191.0 lb

## 2018-12-04 DIAGNOSIS — M25561 Pain in right knee: Secondary | ICD-10-CM

## 2018-12-04 DIAGNOSIS — M47816 Spondylosis without myelopathy or radiculopathy, lumbar region: Secondary | ICD-10-CM | POA: Diagnosis not present

## 2018-12-04 DIAGNOSIS — M25562 Pain in left knee: Secondary | ICD-10-CM | POA: Insufficient documentation

## 2018-12-04 DIAGNOSIS — G4700 Insomnia, unspecified: Secondary | ICD-10-CM | POA: Diagnosis not present

## 2018-12-04 DIAGNOSIS — F17200 Nicotine dependence, unspecified, uncomplicated: Secondary | ICD-10-CM

## 2018-12-04 DIAGNOSIS — F419 Anxiety disorder, unspecified: Secondary | ICD-10-CM | POA: Diagnosis not present

## 2018-12-04 DIAGNOSIS — I10 Essential (primary) hypertension: Secondary | ICD-10-CM

## 2018-12-04 DIAGNOSIS — M5442 Lumbago with sciatica, left side: Secondary | ICD-10-CM | POA: Diagnosis not present

## 2018-12-04 DIAGNOSIS — G8929 Other chronic pain: Secondary | ICD-10-CM

## 2018-12-04 LAB — COMPREHENSIVE METABOLIC PANEL
ALT: 15 U/L (ref 0–35)
AST: 17 U/L (ref 0–37)
Albumin: 4.4 g/dL (ref 3.5–5.2)
Alkaline Phosphatase: 64 U/L (ref 39–117)
BUN: 10 mg/dL (ref 6–23)
CO2: 24 mEq/L (ref 19–32)
Calcium: 9.3 mg/dL (ref 8.4–10.5)
Chloride: 106 mEq/L (ref 96–112)
Creatinine, Ser: 0.73 mg/dL (ref 0.40–1.20)
GFR: 82.81 mL/min (ref >60.00)
Glucose, Bld: 87 mg/dL (ref 70–99)
Potassium: 4.1 mEq/L (ref 3.5–5.1)
Sodium: 139 mEq/L (ref 135–145)
Total Bilirubin: 0.4 mg/dL (ref 0.2–1.2)
Total Protein: 7.1 g/dL (ref 6.0–8.3)

## 2018-12-04 MED ORDER — LOSARTAN POTASSIUM-HCTZ 50-12.5 MG PO TABS: 1.0000 | ORAL_TABLET | Freq: Every day | ORAL | 0 refills | Status: DC

## 2018-12-04 MED ORDER — TRAMADOL HCL 50 MG PO TABS: 50.0000 mg | ORAL_TABLET | Freq: Four times a day (QID) | ORAL | 0 refills | Status: DC | PRN

## 2018-12-04 MED ORDER — ZOLPIDEM TARTRATE 5 MG PO TABS: 5.0000 mg | ORAL_TABLET | Freq: Every evening | ORAL | 0 refills | Status: DC | PRN

## 2018-12-04 NOTE — Patient Instructions (Addendum)
For insomnia will rx low dose ambien 5 mg. Hopefully this will allow you to sleep consistently. Will rx low dose. Encourage to try to not use it every night.   For anxiety will see if mood better with better sleep. Rx advisement.  For back pain/hx of sciatica, advise tylenol and no nsaids since bp appears to be high. Nsaids can increase bp. Will rx limited number of tramadol to help with more severe pain. Get lumbar xray today.  For hx of knee pain bilateral knee xrays today.  For smoking cessation will rx wellbutrin in future when we know bp is controlled.   Will recheck bp today in office then decide if needed bp. Rx losartan 50/12.5 daily for 2 weeks. May need to be on 100/12.5  Follow up in 2 weeks or as needed

## 2018-12-04 NOTE — Progress Notes (Signed)
Subjective:    Patient ID: Laurie Chang, female    DOB: [birth year], 53 y.o.   MRN: [id number]  HPI  Virtual Visit via Video Note  I connected with Laurie Chang on 12/04/18 at  8:00 AM EDT by a video enabled telemedicine application and verified that I am speaking with the correct person using two identifiers.  Location: Patient: home Provider: office   I discussed the limitations of evaluation and management by telemedicine and the availability of in person appointments. The patient expressed understanding and agreed to proceed.  History of Present Illness:   Pt has first visit with [place] today. 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 Pt knows she needs blood work. None done in 4 years  Pt states she can't sleep at night recently x 2 yrs(one night a week sleeps well). She states her mood is worse when not sleeping and having some anxiety. She states her mind always races at night. Pt tries melatonin and alleve pm. Did not help.Pt works first shift.  She thinks will feel anxious more during day if she does not sleep well.  Pt has history of some back pain and some sciatica. Left side radiating pain. Pain for about 2 years.  Work will usually exccaerbate pain. If works over 8 hours will have pain.  Also has some knee pain as well. She states told some arthritis.  Pt also smokes and wants to quit. She has smoked 30 years for about pack a day.   Observations/Objective: General- no acute distress. Normal affect. Oriented. Lungs- appear even and unlabored. Neuro-gross motor function appears intact. Back pain- when rotates thorax to left some mild pain today. When bending over has mild left side si area pain.  Assessment and Plan: For insomnia will rx low dose ambien 5 mg. Hopefully this will allow you to sleep consistently. Will rx low dose. Encourage to try to not use it every night.   For anxiety will see if mood better with better sleep. Rx advisement.  For back  pain/hx of sciatica, advise tylenol and no nsaids since bp appears to be high. Nsaids can increase bp. Will rx limited number of tramadol to help with more severe pain. Get lumbar xray today.  For hx of knee pain bilateral knee xrays today.  For smoking cessation will rx wellbutrin in future when we know bp is controlled.   Will recheck bp today in office then decide if needed bp. Rx losartan 50/12.5 daily for 2 weeks. May need to be on 100/12.5  Follow up in 2 weeks or as needed  Mackie Pai, PA-C    Follow Up Instructions:    I discussed the assessment and treatment plan with the patient. The patient was provided an opportunity to ask questions and all were answered. The patient agreed with the plan and demonstrated an understanding of the instructions.   The patient was advised to call back or seek an in-person evaluation if the symptoms worsen or if the condition fails to improve as anticipated.     Mackie Pai, PA-C   Review of Systems  Constitutional: Negative for activity change, chills, diaphoresis, fatigue and fever.  HENT: Negative for congestion, ear discharge and ear pain.        Pt states will snore very rarely if she is exhausted.  Eyes: Negative for pain, redness and visual disturbance.  Respiratory: Negative for cough, chest tightness, shortness of breath and wheezing.   Cardiovascular: Negative for chest pain, palpitations  and leg swelling.  Gastrointestinal: Negative for abdominal pain, nausea and vomiting.  Endocrine: Negative for polydipsia, polyphagia and polyuria.  Genitourinary: Negative for dyspareunia, dysuria, flank pain, genital sores, urgency and vaginal bleeding.  Musculoskeletal: Positive for back pain. Negative for neck pain and neck stiffness.       Knee pain  Skin: Negative for rash.  Neurological: Negative for dizziness, seizures, syncope, weakness, light-headedness, numbness and headaches.  Hematological: Negative for adenopathy.   Psychiatric/Behavioral: Positive for sleep disturbance. Negative for agitation, behavioral problems, confusion, dysphoric mood and suicidal ideas. The patient is nervous/anxious.     Past Medical History:  Diagnosis Date  . Arthritis   . Leiomyoma uteri   . Smoker    1/2 PPD  . Syncope and collapse      Social History   Socioeconomic History  . Marital status: Married    Spouse name: Not on file  . Number of children: 3  . Years of education: 72  . Highest education level: Not on file  Occupational History  . 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 Financial resource strain: Not on file  . Food insecurity:    Worry: Not on file    Inability: Not on file  . Transportation needs:    Medical: Not on file    Non-medical: Not on file  Tobacco Use  . Smoking status: Current Every Day Smoker    Packs/day: 1.00    Years: 28.00    Pack years: 28.00    Types: Cigarettes  . Smokeless tobacco: Never Used  Substance and Sexual Activity  . Alcohol use: Yes    Alcohol/week: 0.0 - 1.0 standard drinks    Comment: once in a blue moon  . Drug use: No  . Sexual activity: Yes    Partners: Male    Birth control/protection: Surgical  Lifestyle  . Physical activity:    Days per week: Not on file    Minutes per session: Not on file  . Stress: Not on file  Relationships  . Social connections:    Talks on phone: Not on file    Gets together: Not on file    Attends religious service: Not on file    Active member of club or organization: Not on file    Attends meetings of clubs or organizations: Not on file    Relationship status: Not on file  . Intimate partner violence:    Fear of current or ex partner: Not on file    Emotionally abused: Not on file    Physically abused: Not on file    Forced sexual activity: Not on file  Other Topics Concern  . Not on file  Social History Narrative   Widowed in  2012.  Lives with her daughter and two granddaughters.    Past Surgical History:  Procedure Laterality Date  . ABDOMINAL HYSTERECTOMY  12/11/10   TAH,RSO  . HYSTEROSCOPY  11/2000   HYSTEROSCOPIC MYOMECTOMY  . LAPAROSCOPIC CHOLECYSTECTOMY    . OOPHORECTOMY  12/11/10   TAH,RSO  . TUBAL LIGATION    . VEIN SURGERY      Family History  Problem Relation Age of Onset  . Hypertension Father   . Heart disease Father   . Hypertension Brother   . Cancer Mother        LUNG  . Cancer Maternal Grandmother   . Breast cancer Maternal Grandmother  No Known Allergies  Current Outpatient Medications on File Prior to Visit  Medication Sig Dispense Refill  . aspirin 81 MG tablet Take 81 mg by mouth daily.    Marland Kitchen CALCIUM PO Take 1 tablet by mouth daily.     . Multiple Vitamins-Iron (MULTIVITAMIN/IRON PO) Take 1 tablet by mouth daily.     . nicotine (NICODERM CQ - DOSED IN MG/24 HOURS) 21 mg/24hr patch Place 1 patch (21 mg total) onto the skin daily. (Patient not taking: Reported on 12/04/2018) 28 patch 3  . Omega-3 Fatty Acids (FISH OIL BURP-LESS) 1000 MG CAPS Take by mouth.    . traMADol (ULTRAM) 50 MG tablet TAKE 1 TABLET BY MOUTH AT BEDTIME AS NEEDED (Patient not taking: Reported on 12/04/2018) 40 tablet 0  . vitamin B-12 (CYANOCOBALAMIN) 100 MCG tablet Take 100 mcg by mouth daily.     No current facility-administered medications on file prior to visit.     LMP 08/19/2010       Objective:   Physical Exam        Assessment & Plan:

## 2018-12-05 ENCOUNTER — Telehealth: Payer: Self-pay | Admitting: Medical

## 2018-12-05 NOTE — Telephone Encounter (Signed)
Results seen by Pt.

## 2018-12-05 NOTE — Telephone Encounter (Signed)
LVM for patient to call back to schedule 2 week f/u appointment from 12/04/18 visit.

## 2018-12-07 ENCOUNTER — Other Ambulatory Visit: Payer: Self-pay | Admitting: Medical

## 2018-12-07 DIAGNOSIS — Z1231 Encounter for screening mammogram for malignant neoplasm of breast: Secondary | ICD-10-CM

## 2018-12-18 ENCOUNTER — Encounter: Payer: Self-pay | Admitting: Medical

## 2018-12-19 ENCOUNTER — Other Ambulatory Visit: Payer: Self-pay

## 2018-12-19 DIAGNOSIS — Z79899 Other long term (current) drug therapy: Secondary | ICD-10-CM

## 2018-12-19 MED ORDER — LOSARTAN POTASSIUM-HCTZ 50-12.5 MG PO TABS: 1.0000 | ORAL_TABLET | Freq: Every day | ORAL | 0 refills | Status: DC

## 2018-12-20 ENCOUNTER — Ambulatory Visit (INDEPENDENT_AMBULATORY_CARE_PROVIDER_SITE_OTHER): Payer: BC Managed Care – PPO | Admitting: Medical

## 2018-12-20 VITALS — BP 134/90

## 2018-12-20 DIAGNOSIS — Z1211 Encounter for screening for malignant neoplasm of colon: Secondary | ICD-10-CM | POA: Diagnosis not present

## 2018-12-20 DIAGNOSIS — G8929 Other chronic pain: Secondary | ICD-10-CM

## 2018-12-20 DIAGNOSIS — G4700 Insomnia, unspecified: Secondary | ICD-10-CM | POA: Diagnosis not present

## 2018-12-20 DIAGNOSIS — M25562 Pain in left knee: Secondary | ICD-10-CM

## 2018-12-20 DIAGNOSIS — M25561 Pain in right knee: Secondary | ICD-10-CM | POA: Diagnosis not present

## 2018-12-20 DIAGNOSIS — I10 Essential (primary) hypertension: Secondary | ICD-10-CM

## 2018-12-20 MED ORDER — LOSARTAN POTASSIUM-HCTZ 50-12.5 MG PO TABS: 1.0000 | ORAL_TABLET | Freq: Every day | ORAL | 1 refills | Status: DC

## 2018-12-20 MED ORDER — MELOXICAM 7.5 MG PO TABS: 7.5000 mg | ORAL_TABLET | Freq: Every day | ORAL | 0 refills | Status: DC

## 2018-12-20 NOTE — Progress Notes (Signed)
Subjective:    Patient ID: Laurie Chang, female    DOB: 08-23-1965, 53 y.o.   MRN: [id number]  HPI  Virtual Visit via Video Note  I connected with Laurie Chang on 12/20/18 at  3:40 PM EDT by a video enabled telemedicine application and verified that I am speaking with the correct person using two identifiers.  Location: Patient: home Provider: home   I discussed the limitations of evaluation and management by telemedicine and the availability of in person appointments. The patient expressed understanding and agreed to proceed.   History of Present Illness:  Pt states she has been sleeping well with only 1/2 tab of ambien 5 mg at night. No side effects. Feels well the next day.  Pt states her anxiety and decreased mood is minimal presenty/ little better with sleep. No meds needed per pt.  Pt bp was controlled on losartan 50/12.5 mg daily dose. Was in 130/80 range when on meds. Last day on med Sunday was 134/90 before ran out of med Pt gets 16,000 steps a day. Aware should eat low salt diet.  Pt knee pain has decreased a lot with tramadol. I rx'd that since with high bp did not want to rx nsaid. She takes 1/2 tab tramadol during the day and full tab at night. Rt knee hurts worse. In past motrin 800 mg did not help. Pt thinks may need tramadol monthly as she had used that in past before it became controlled medications     Observations/Objective:  General-no acute distress, pleasant, oriented. Lungs- on inspection lungs appear unlabored. Neck- no tracheal deviation or jvd on inspection. Neuro- gross motor function appears intact.    Assessment and Plan: Insomnia much better with use of ambien. Will refill when current rx expires.Will get her to sign controlled med contract and give uds within next weeks.  For knee pain, rx low dose meloxicam. Will rx limited number  tramadol. Will see how she does with combo of meds. Can rx low number tramadol on monthly basis but  will need to follow controlled med guidelines.  For htn refill her losartan/hctz.  Referral for screening colonoscopy.   Follow up early September for wellness exam or sooner if needed.  Mackie Pai, PA-C  Follow Up Instructions:    I discussed the assessment and treatment plan with the patient. The patient was provided an opportunity to ask questions and all were answered. The patient agreed with the plan and demonstrated an understanding of the instructions.   The patient was advised to call back or seek an in-person evaluation if the symptoms worsen or if the condition fails to improve as anticipated.     Mackie Pai, PA-C    Review of Systems  Constitutional: Negative for chills.  HENT: Negative for congestion, dental problem, facial swelling, rhinorrhea, sinus pressure and trouble swallowing.   Respiratory: Negative for cough, shortness of breath and wheezing.   Cardiovascular: Negative for chest pain and palpitations.  Gastrointestinal: Negative for abdominal pain.  Musculoskeletal:       Knee pain  Skin: Negative for pallor and rash.  Neurological: Negative for dizziness, seizures, weakness and light-headedness.  Psychiatric/Behavioral: Positive for dysphoric mood and sleep disturbance. Negative for behavioral problems and suicidal ideas. The patient is nervous/anxious.        States mood and anxiety and minimal. No med needed per pt.       Objective:   Physical Exam        Assessment &  Plan:

## 2018-12-20 NOTE — Patient Instructions (Addendum)
Insomnia much better with use of ambien. Will refill when current rx expires.Will get her to sign controlled med contract and give uds within next weeks.  For knee pain, rx low dose meloxicam. Will rx limited number  tramadol. Will see how she does with combo of meds. Can rx low number tramadol on monthly basis but will need to follow controlled med guidelines.  For htn refill her losartan/hctz.  Referral for screening colonoscopy.   Follow up early September for wellness exam or sooner if needed.

## 2018-12-21 ENCOUNTER — Other Ambulatory Visit: Payer: Self-pay | Admitting: Medical

## 2018-12-22 NOTE — Telephone Encounter (Signed)
Yes. Needs both. Though I sent message. Maybe was about to and got distracted.

## 2018-12-22 NOTE — Telephone Encounter (Signed)
I do not have a message stating pt needs contract will call and get that set up. Does pt need UDS?

## 2018-12-22 NOTE — Telephone Encounter (Signed)
Left pt a message notifying her to call back to schedule lab appointment for UDS and contract

## 2018-12-22 NOTE — Telephone Encounter (Signed)
Refill request: Zolpidem   Last RX:12/04/18 Last OV:12/20/18 Next QM:GNOI scheduled  UDS: CSC: CSR:   Refill Request: Tramadol  Last RX:07/05/15 Last OV:12/20/18 Next BB:CWUG Scheduled  UDS:  CSC: CSR:

## 2018-12-22 NOTE — Telephone Encounter (Signed)
Rx ambien and tramadol sent to pt pharmacy. I sent you message ealier about getting her in to sign contract for both meds and to give [place]. Will you get her scheduled for that.   Same number of tabs for each.

## 2019-01-01 NOTE — Addendum Note (Signed)
Addended by: Hinton Dyer on: 01/01/2019 10:54 AM   Modules accepted: Orders

## 2019-01-02 ENCOUNTER — Other Ambulatory Visit: Payer: Self-pay

## 2019-01-02 ENCOUNTER — Other Ambulatory Visit (INDEPENDENT_AMBULATORY_CARE_PROVIDER_SITE_OTHER): Payer: BC Managed Care – PPO

## 2019-01-02 ENCOUNTER — Telehealth: Payer: Self-pay | Admitting: Medical

## 2019-01-02 DIAGNOSIS — Z79899 Other long term (current) drug therapy: Secondary | ICD-10-CM | POA: Diagnosis not present

## 2019-01-03 LAB — PAIN MGMT, PROFILE 8 W/CONF, U
6 Acetylmorphine: NEGATIVE ng/mL
Alcohol Metabolites: NEGATIVE ng/mL (ref <500)
Amphetamines: NEGATIVE ng/mL
Benzodiazepines: NEGATIVE ng/mL
Buprenorphine, Urine: NEGATIVE ng/mL
Cocaine Metabolite: NEGATIVE ng/mL
Creatinine: 33.4 mg/dL
MDMA: NEGATIVE ng/mL
Marijuana Metabolite: NEGATIVE ng/mL
Opiates: NEGATIVE ng/mL
Oxidant: NEGATIVE ug/mL
Oxycodone: NEGATIVE ng/mL
pH: 5.9 (ref 4.5–9.0)

## 2019-01-04 NOTE — Telephone Encounter (Signed)
Error

## 2019-01-06 ENCOUNTER — Other Ambulatory Visit: Payer: Self-pay

## 2019-01-06 ENCOUNTER — Ambulatory Visit
Admission: RE | Admit: 2019-01-06 | Discharge: 2019-01-06 | Disposition: A | Discharge status: Home / Self Care | Payer: BC Managed Care – PPO | Source: Ambulatory Visit | Attending: Medical | Admitting: Medical

## 2019-01-06 DIAGNOSIS — Z1231 Encounter for screening mammogram for malignant neoplasm of breast: Secondary | ICD-10-CM

## 2019-01-10 ENCOUNTER — Other Ambulatory Visit: Payer: Self-pay | Admitting: Medical

## 2019-01-10 NOTE — Telephone Encounter (Addendum)
Refill Request:Tramadol   Last RX:12/04/18 Last OV:12/20/18 Next YH:OOIL schedule  UDS:01/02/19 CSC:01/02/19 CSR:

## 2019-01-11 ENCOUNTER — Encounter: Payer: Self-pay | Admitting: Medical

## 2019-01-11 ENCOUNTER — Other Ambulatory Visit: Payer: Self-pay | Admitting: Medical

## 2019-01-11 MED ORDER — TRAMADOL HCL 50 MG PO TABS: ORAL_TABLET | ORAL | 0 refills | Status: DC

## 2019-01-11 NOTE — Telephone Encounter (Signed)
Rx tramadol refill sent to pt pharmacy.

## 2019-02-05 ENCOUNTER — Encounter: Payer: Self-pay | Admitting: Medical

## 2019-02-08 ENCOUNTER — Encounter: Payer: Self-pay | Admitting: Medical

## 2019-02-08 ENCOUNTER — Ambulatory Visit: Payer: BC Managed Care – PPO | Admitting: Medical

## 2019-02-08 ENCOUNTER — Other Ambulatory Visit: Payer: Self-pay

## 2019-02-08 VITALS — BP 139/84 | HR 78 | Temp 98.7°F | Resp 16 | Ht 65.0 in | Wt 193.4 lb

## 2019-02-08 DIAGNOSIS — L089 Local infection of the skin and subcutaneous tissue, unspecified: Secondary | ICD-10-CM | POA: Diagnosis not present

## 2019-02-08 DIAGNOSIS — T7840XA Allergy, unspecified, initial encounter: Secondary | ICD-10-CM | POA: Diagnosis not present

## 2019-02-08 MED ORDER — HYDROXYZINE HCL 10 MG PO TABS: ORAL_TABLET | ORAL | 0 refills | Status: DC

## 2019-02-08 MED ORDER — DOXYCYCLINE HYCLATE 100 MG PO TABS: 100.0000 mg | ORAL_TABLET | Freq: Two times a day (BID) | ORAL | 0 refills | Status: DC

## 2019-02-08 MED ORDER — PREDNISONE 10 MG PO TABS: ORAL_TABLET | ORAL | 0 refills | Status: DC

## 2019-02-08 NOTE — Progress Notes (Signed)
Subjective:    Patient ID: Laurie Chang, female    DOB: 1965/11/29, 53 y.o.   MRN: [id number]  HPI  Pt states small break to her skin that she saw on Saturday. She state some redness and swelling on Sunday. On Monday saw 2 small tiny blisters in  this area on Monday near small broken down area of skin. Does not remember any particular insect bite.  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 Small amount of yellow watery dc.   The area has decreased in swelling since Monday.  Pt described area of swelling about 5 cm x 3 cm with thickness on Monday.  Some itching to area mostly at night. Pt wonders if had initial spider bite.   Review of Systems  Constitutional: Negative for chills, fatigue and fever.  Respiratory: Negative for chest tightness, shortness of breath and wheezing.   Cardiovascular: Negative for chest pain and palpitations.  Gastrointestinal: Negative for abdominal pain.  Skin: Positive for rash.       See hpi.  Hematological: Negative for adenopathy. Does not bruise/bleed easily.  Psychiatric/Behavioral: Negative for behavioral problems. The patient is not nervous/anxious.    Past Medical History:  Diagnosis Date  . Arthritis   . Cancer ([place])   . Hyperlipidemia   . Leiomyoma uteri   . Smoker    1/2 PPD  . Syncope and collapse      Social History   Socioeconomic History  . Marital status: Married    Spouse name: Not on file  . Number of children: 3  . Years of education: 5  . Highest education level: Not on file  Occupational History  . Occupation: Med Engineer, production: Publishing rights manager LIVING    Comment: [place]  Social Needs  . Financial resource strain: Not on file  . Food insecurity    Worry: Not on file    Inability: Not on file  . Transportation needs    Medical: Not on file    Non-medical: Not on file  Tobacco Use  . Smoking status: Current Every Day Smoker    Packs/day: 1.00    Years: 28.00    Pack years:  28.00    Types: Cigarettes  . Smokeless tobacco: Never Used  Substance and Sexual Activity  . Alcohol use: Yes    Alcohol/week: 0.0 - 1.0 standard drinks    Comment: once in a blue moon  . Drug use: No  . Sexual activity: Yes    Partners: Male    Birth control/protection: Surgical  Lifestyle  . Physical activity    Days per week: Not on file    Minutes per session: Not on file  . Stress: Not on file  Relationships  . Social Herbalist on phone: Not on file    Gets together: Not on file    Attends religious service: Not on file    Active member of club or organization: Not on file    Attends meetings of clubs or organizations: Not on file    Relationship status: Not on file  . Intimate partner violence    Fear of current or ex partner: Not on file    Emotionally abused: Not on file    Physically abused: Not on file    Forced sexual activity: Not on file  Other Topics Concern  . Not on file  Social History Narrative   Widowed in 2012.  Lives with her  daughter and two granddaughters.    Past Surgical History:  Procedure Laterality Date  . ABDOMINAL HYSTERECTOMY  12/11/10   TAH,RSO  . HYSTEROSCOPY  11/2000   HYSTEROSCOPIC MYOMECTOMY  . LAPAROSCOPIC CHOLECYSTECTOMY    . OOPHORECTOMY  12/11/10   TAH,RSO  . TUBAL LIGATION    . VEIN SURGERY      Family History  Problem Relation Age of Onset  . Hypertension Father   . Heart disease Father   . Hypertension Brother   . Cancer Mother        LUNG  . Cancer Maternal Grandmother   . Breast cancer Maternal Grandmother     No Known Allergies  Current Outpatient Medications on File Prior to Visit  Medication Sig Dispense Refill  . aspirin 81 MG tablet Take 81 mg by mouth daily.    Marland Kitchen CALCIUM PO Take 1 tablet by mouth daily.     Marland Kitchen losartan-hydrochlorothiazide (HYZAAR) 50-12.5 MG tablet Take 1 tablet by mouth daily. 90 tablet 1  . meloxicam (MOBIC) 7.5 MG tablet TAKE 1 TABLET BY MOUTH EVERY DAY 30 tablet 0  .  Multiple Vitamins-Iron (MULTIVITAMIN/IRON PO) Take 1 tablet by mouth daily.     . nicotine (NICODERM CQ - DOSED IN MG/24 HOURS) 21 mg/24hr patch Place 1 patch (21 mg total) onto the skin daily. 28 patch 3  . Omega-3 Fatty Acids (FISH OIL BURP-LESS) 1000 MG CAPS Take by mouth.    . traMADol (ULTRAM) 50 MG tablet 1/2-1 tab po q 12 hours as needed pain 45 tablet 0  . vitamin B-12 (CYANOCOBALAMIN) 100 MCG tablet Take 100 mcg by mouth daily.    Marland Kitchen zolpidem (AMBIEN) 5 MG tablet TAKE 1 TABLET (5 MG TOTAL) BY MOUTH AT BEDTIME AS NEEDED FOR SLEEP. 30 tablet 0   No current facility-administered medications on file prior to visit.     BP 139/84   Pulse 78   Temp 98.7 F (37.1 C) (Oral)   Resp 16   Ht 5\' 5"  (1.651 m)   Wt 193 lb 6.4 oz (87.7 kg)   LMP 08/19/2010   SpO2 100%   BMI 32.18 kg/m       Objective:   Physical Exam  General- No acute distress. Pleasant patient. Neck- Full range of motion, no jvd Lungs- Clear, even and unlabored. Heart- regular rate and rhythm. Neurologic- CNII- XII grossly intact.  Left distal foream area tiny small broken down area about 3 cm x 3 cm around this area that had faint reddish/faint bruised appearance. Faint swollen. No warmth. No dc.      Assessment & Plan:  By your description of left forearm earlier in the week and recent history, I do have concerns that you might have had a spider bite with localized allergic reaction as well as secondary skin infection.  Will prescribe doxycycline antibiotic, 4-day taper dose of prednisone and hydroxyzine antihistamine to use at night for itching.  The area has already improved some compared to your description of area on Monday.  Expect to see gradual improvement from here on out.  If area worsens or changes as described let us know.  Follow-up in 7 to 10 days or as needed.

## 2019-02-08 NOTE — Patient Instructions (Signed)
By your description of left forearm earlier in the week and recent history, I do have concerns that you might have had a spider bite with localized allergic reaction as well as secondary skin infection.  Will prescribe doxycycline antibiotic, 4-day taper dose of prednisone and hydroxyzine antihistamine to use at night for itching.  The area has already improved some compared to your description of area on Monday.  Expect to see gradual improvement from here on out.  If area worsens or changes as described let us know.  Follow-up in 7 to 10 days or as needed.

## 2019-02-16 ENCOUNTER — Other Ambulatory Visit: Payer: Self-pay | Admitting: Medical

## 2019-02-21 ENCOUNTER — Other Ambulatory Visit: Payer: Self-pay | Admitting: Medical

## 2019-02-22 MED ORDER — TRAMADOL HCL 50 MG PO TABS: ORAL_TABLET | ORAL | 0 refills | Status: DC

## 2019-02-22 NOTE — Telephone Encounter (Signed)
Refill Request: Tramadol  Last RX:01/11/19 Last OV:02/08/19 Next KB:TCYE scheduled  UDS:01/02/19 CSC:01/02/19 CSR:

## 2019-03-05 DIAGNOSIS — Z20828 Contact with and (suspected) exposure to other viral communicable diseases: Secondary | ICD-10-CM | POA: Diagnosis not present

## 2019-03-12 ENCOUNTER — Other Ambulatory Visit: Payer: Self-pay | Admitting: Medical

## 2019-03-16 ENCOUNTER — Other Ambulatory Visit: Payer: Self-pay | Admitting: Medical

## 2019-03-16 NOTE — Telephone Encounter (Addendum)
Refill Request: Zolpidem  Last RX: 12/22/18 Last OV: 02/15/19 Next OV: None scheduled  UDS:01/02/19 CSC:01/02/19 CSR: 03/17/19.  Rx refilled but fill date 03/25/19.

## 2019-03-17 MED ORDER — ZOLPIDEM TARTRATE 5 MG PO TABS: 5.0000 mg | ORAL_TABLET | Freq: Every evening | ORAL | 0 refills | Status: DC | PRN

## 2019-03-19 DIAGNOSIS — U071 COVID-19: Secondary | ICD-10-CM | POA: Diagnosis not present

## 2019-03-19 DIAGNOSIS — Z20828 Contact with and (suspected) exposure to other viral communicable diseases: Secondary | ICD-10-CM | POA: Diagnosis not present

## 2019-03-27 DIAGNOSIS — U071 COVID-19: Secondary | ICD-10-CM | POA: Diagnosis not present

## 2019-03-27 DIAGNOSIS — Z20828 Contact with and (suspected) exposure to other viral communicable diseases: Secondary | ICD-10-CM | POA: Diagnosis not present

## 2019-03-30 ENCOUNTER — Other Ambulatory Visit: Payer: Self-pay

## 2019-04-02 ENCOUNTER — Encounter: Payer: Self-pay | Admitting: Medical

## 2019-04-02 ENCOUNTER — Telehealth: Payer: Self-pay | Admitting: Medical

## 2019-04-02 ENCOUNTER — Ambulatory Visit (INDEPENDENT_AMBULATORY_CARE_PROVIDER_SITE_OTHER): Payer: BLUE CROSS/BLUE SHIELD | Admitting: Medical

## 2019-04-02 ENCOUNTER — Other Ambulatory Visit: Payer: Self-pay

## 2019-04-02 VITALS — BP 138/85 | HR 90 | Temp 97.8°F | Resp 16 | Ht 65.0 in | Wt 193.0 lb

## 2019-04-02 DIAGNOSIS — Z0001 Encounter for general adult medical examination with abnormal findings: Secondary | ICD-10-CM

## 2019-04-02 DIAGNOSIS — R232 Flushing: Secondary | ICD-10-CM

## 2019-04-02 DIAGNOSIS — Z23 Encounter for immunization: Secondary | ICD-10-CM | POA: Diagnosis not present

## 2019-04-02 DIAGNOSIS — R5383 Other fatigue: Secondary | ICD-10-CM | POA: Diagnosis not present

## 2019-04-02 DIAGNOSIS — Z0000 Encounter for general adult medical examination without abnormal findings: Secondary | ICD-10-CM

## 2019-04-02 DIAGNOSIS — Z113 Encounter for screening for infections with a predominantly sexual mode of transmission: Secondary | ICD-10-CM

## 2019-04-02 DIAGNOSIS — Z20828 Contact with and (suspected) exposure to other viral communicable diseases: Secondary | ICD-10-CM | POA: Diagnosis not present

## 2019-04-02 LAB — COMPREHENSIVE METABOLIC PANEL
ALT: 16 U/L (ref 0–35)
AST: 15 U/L (ref 0–37)
Albumin: 4.3 g/dL (ref 3.5–5.2)
Alkaline Phosphatase: 59 U/L (ref 39–117)
BUN: 14 mg/dL (ref 6–23)
CO2: 26 mEq/L (ref 19–32)
Calcium: 9.7 mg/dL (ref 8.4–10.5)
Chloride: 104 mEq/L (ref 96–112)
Creatinine, Ser: 0.76 mg/dL (ref 0.40–1.20)
GFR: 78.96 mL/min (ref >60.00)
Glucose, Bld: 88 mg/dL (ref 70–99)
Potassium: 4.3 mEq/L (ref 3.5–5.1)
Sodium: 139 mEq/L (ref 135–145)
Total Bilirubin: 0.4 mg/dL (ref 0.2–1.2)
Total Protein: 7.1 g/dL (ref 6.0–8.3)

## 2019-04-02 LAB — CBC WITH DIFFERENTIAL/PLATELET
Basophils Absolute: 0.1 10*3/uL (ref 0.0–0.1)
Basophils Relative: 0.7 % (ref 0.0–3.0)
Eosinophils Absolute: 0.1 10*3/uL (ref 0.0–0.7)
Eosinophils Relative: 1.3 % (ref 0.0–5.0)
HCT: 45.9 % (ref 36.0–46.0)
Hemoglobin: 15.5 g/dL — ABNORMAL HIGH (ref 12.0–15.0)
Lymphocytes Relative: 33.1 % (ref 12.0–46.0)
Lymphs Abs: 3.6 10*3/uL (ref 0.7–4.0)
MCHC: 33.8 g/dL (ref 30.0–36.0)
MCV: 91.8 fl (ref 78.0–100.0)
Monocytes Absolute: 0.5 10*3/uL (ref 0.1–1.0)
Monocytes Relative: 4.6 % (ref 3.0–12.0)
Neutro Abs: 6.5 10*3/uL (ref 1.4–7.7)
Neutrophils Relative %: 60.3 % (ref 43.0–77.0)
Platelets: 331 10*3/uL (ref 150.0–400.0)
RBC: 5 Mil/uL (ref 3.87–5.11)
RDW: 12.8 % (ref 11.5–15.5)
WBC: 10.8 10*3/uL — ABNORMAL HIGH (ref 4.0–10.5)

## 2019-04-02 LAB — LIPID PANEL
Cholesterol: 215 mg/dL — ABNORMAL HIGH (ref 0–200)
HDL: 37.1 mg/dL — ABNORMAL LOW (ref >39.00)
LDL Cholesterol: 141 mg/dL — ABNORMAL HIGH (ref 0–99)
NonHDL: 178.36
Total CHOL/HDL Ratio: 6
Triglycerides: 185 mg/dL — ABNORMAL HIGH (ref 0.0–149.0)
VLDL: 37 mg/dL (ref 0.0–40.0)

## 2019-04-02 LAB — VITAMIN B12: Vitamin B-12: 561 pg/mL (ref 211–911)

## 2019-04-02 LAB — FOLLICLE STIMULATING HORMONE: FSH: 86.8 m[IU]/mL

## 2019-04-02 LAB — TSH: TSH: 1.99 u[IU]/mL (ref 0.35–4.50)

## 2019-04-02 LAB — T4, FREE: Free T4: 0.9 ng/dL (ref 0.60–1.60)

## 2019-04-02 MED ORDER — BUPROPION HCL ER (XL) 150 MG PO TB24: 150.0000 mg | ORAL_TABLET | Freq: Every day | ORAL | 1 refills | Status: DC

## 2019-04-02 MED ORDER — ATORVASTATIN CALCIUM 10 MG PO TABS: 10.0000 mg | ORAL_TABLET | Freq: Every day | ORAL | 3 refills | Status: DC

## 2019-04-02 NOTE — Telephone Encounter (Signed)
Rx atorvastatin sent to pt pharmacy. 

## 2019-04-02 NOTE — Patient Instructions (Addendum)
For you wellness exam today I have ordered cbc, cmp, tsh t4, b1, b12, vit d, lipid panel and hiv  Vaccine given today flu vaccine and tdap.  Recommend exercise and healthy diet.  We will let you know lab results as they come in.  Follow up date appointment will be determined after lab review.     Preventive Care 28-55 Years Old, Female Preventive care refers to visits with your health care provider and lifestyle choices that can promote health and wellness. This includes:  A yearly physical exam. This may also be called an annual well check.  Regular dental visits and eye exams.  Immunizations.  Screening for certain conditions.  Healthy lifestyle choices, such as eating a healthy diet, getting regular exercise, not using drugs or products that contain nicotine and tobacco, and limiting alcohol use. What can I expect for my preventive care visit? Physical exam Your health care provider will check your:  Height and weight. This may be used to calculate body mass index (BMI), which tells if you are at a healthy weight.  Heart rate and blood pressure.  Skin for abnormal spots. Counseling Your health care provider may ask you questions about your:  Alcohol, tobacco, and drug use.  Emotional well-being.  Home and relationship well-being.  Sexual activity.  Eating habits.  Work and work Statistician.  Method of birth control.  Menstrual cycle.  Pregnancy history. What immunizations do I need?  Influenza (flu) vaccine  This is recommended every year. Tetanus, diphtheria, and pertussis (Tdap) vaccine  You may need a Td booster every 10 years. Varicella (chickenpox) vaccine  You may need this if you have not been vaccinated. Zoster (shingles) vaccine  You may need this after age 38. Measles, mumps, and rubella (MMR) vaccine  You may need at least one dose of MMR if you were born in 1957 or later. You may also need a second dose. Pneumococcal conjugate  (PCV13) vaccine  You may need this if you have certain conditions and were not previously vaccinated. Pneumococcal polysaccharide (PPSV23) vaccine  You may need one or two doses if you smoke cigarettes or if you have certain conditions. Meningococcal conjugate (MenACWY) vaccine  You may need this if you have certain conditions. Hepatitis A vaccine  You may need this if you have certain conditions or if you travel or work in places where you may be exposed to hepatitis A. Hepatitis B vaccine  You may need this if you have certain conditions or if you travel or work in places where you may be exposed to hepatitis B. Haemophilus influenzae type b (Hib) vaccine  You may need this if you have certain conditions. Human papillomavirus (HPV) vaccine  If recommended by your health care provider, you may need three doses over 6 months. You may receive vaccines as individual doses or as more than one vaccine together in one shot (combination vaccines). Talk with your health care provider about the risks and benefits of combination vaccines. What tests do I need? Blood tests  Lipid and cholesterol levels. These may be checked every 5 years, or more frequently if you are over 62 years old.  Hepatitis C test.  Hepatitis B test. Screening  Lung cancer screening. You may have this screening every year starting at age 11 if you have a 30-pack-year history of smoking and currently smoke or have quit within the past 15 years.  Colorectal cancer screening. All adults should have this screening starting at age 65 and continuing  until age 39. Your health care provider may recommend screening at age 66 if you are at increased risk. You will have tests every 1-10 years, depending on your results and the type of screening test.  Diabetes screening. This is done by checking your blood sugar (glucose) after you have not eaten for a while (fasting). You may have this done every 1-3 years.  Mammogram. This  may be done every 1-2 years. Talk with your health care provider about when you should start having regular mammograms. This may depend on whether you have a family history of breast cancer.  BRCA-related cancer screening. This may be done if you have a family history of breast, ovarian, tubal, or peritoneal cancers.  Pelvic exam and Pap test. This may be done every 3 years starting at age 61. Starting at age 54, this may be done every 5 years if you have a Pap test in combination with an HPV test. Other tests  Sexually transmitted disease (STD) testing.  Bone density scan. This is done to screen for osteoporosis. You may have this scan if you are at high risk for osteoporosis. Follow these instructions at home: Eating and drinking  Eat a diet that includes fresh fruits and vegetables, whole grains, lean protein, and low-fat dairy.  Take vitamin and mineral supplements as recommended by your health care provider.  Do not drink alcohol if: ? Your health care provider tells you not to drink. ? You are pregnant, may be pregnant, or are planning to become pregnant.  If you drink alcohol: ? Limit how much you have to 0-1 drink a day. ? Be aware of how much alcohol is in your drink. In the [place] one drink equals one 12 oz bottle of beer (355 mL), one 5 oz glass of wine (148 mL), or one 1 oz glass of hard liquor (44 mL). Lifestyle  Take daily care of your teeth and gums.  Stay active. Exercise for at least 30 minutes on 5 or more days each week.  Do not use any products that contain nicotine or tobacco, such as cigarettes, e-cigarettes, and chewing tobacco. If you need help quitting, ask your health care provider.  If you are sexually active, practice safe sex. Use a condom or other form of birth control (contraception) in order to prevent pregnancy and STIs (sexually transmitted infections).  If told by your health care provider, take low-dose aspirin daily starting at age 46. What's  next?  Visit your health care provider once a year for a well check visit.  Ask your health care provider how often you should have your eyes and teeth checked.  Stay up to date on all vaccines. This information is not intended to replace advice given to you by your health care provider. Make sure you discuss any questions you have with your health care provider. Document Released: 08/01/2015 Document Revised: 03/16/2018 Document Reviewed: 03/16/2018 Elsevier Patient Education  2020 [employer].

## 2019-04-02 NOTE — Progress Notes (Addendum)
Subjective:    Patient ID: Laurie Chang, female    DOB: [birth year], 53 y.o.   MRN: [id number]  HPI  Patient here for annual exam. Reports fatigue for the past few months despite sleeping about 7 hours each night. Reports only waking up once per night to use the bathroom, but no trouble sleeping. Consistent fatigue every day, not worse at any specific time of day.   Reports feeling bloated for the past few months. States she eats healthy and exercises regularly, but feels bloated after all meals. Reports gaining about 15-20 pounds over the past 3-4 years. States her weight fluctuates frequently despite healthy diet and exercise. Walks a lot at work and alternates crunches and free weights every evening.  Concerned she may be going through menopause. Pt had total hysterectomy in 2012 due to fibroids(on chart review per prior gyn). Other recent symptoms includes hot flashes for the past few months, no night sweats.  Would also like to discuss smoking cessation today as well. Interested Wellbutrin. She has tried Chantix in the past but did not like the way it made her feel - "felt funny and had weird dreams."  Mammogram this year was normal per patient. Reports last pap smear was in 2013, normal.  Has not yet had colonoscopy - states she is working on scheduling.  Would like to update tetanus and flu immunizations today.  Pt needs to schedule her colonoscopy.  Review of Systems  Constitutional: Positive for fatigue. Negative for chills and fever.  HENT: Negative for ear pain and sore throat.   Eyes: Negative for redness and visual disturbance.  Respiratory: Negative for cough, shortness of breath and wheezing.   Cardiovascular: Negative for chest pain and palpitations.  Gastrointestinal: Negative for blood in stool, constipation, diarrhea, nausea and vomiting.       Bloating after all meals; occasional heartburn relieved with tums  Endocrine: Negative for polydipsia, polyphagia and  polyuria.  Genitourinary: Negative for dysuria, flank pain and hematuria.  Musculoskeletal: Negative for back pain and myalgias.  Skin: Negative for rash and wound.  Neurological: Negative for dizziness, light-headedness and headaches.  Psychiatric/Behavioral: Negative for behavioral problems and self-injury.   Past Medical History:  Diagnosis Date  . Arthritis   . Cancer ([place])   . Hyperlipidemia   . Leiomyoma uteri   . Smoker    1/2 PPD  . Syncope and collapse      Social History   Socioeconomic History  . Marital status: Married    Spouse name: Not on file  . Number of children: 3  . Years of education: 49  . Highest education level: Not on file  Occupational History  . Occupation: Med Engineer, production: Publishing rights manager LIVING    Comment: [place]  Social Needs  . Financial resource strain: Not on file  . Food insecurity    Worry: Not on file    Inability: Not on file  . Transportation needs    Medical: Not on file    Non-medical: Not on file  Tobacco Use  . Smoking status: Current Every Day Smoker    Packs/day: 1.00    Years: 28.00    Pack years: 28.00    Types: Cigarettes  . Smokeless tobacco: Never Used  Substance and Sexual Activity  . Alcohol use: Yes    Alcohol/week: 0.0 - 1.0 standard drinks    Comment: once in a blue moon  . Drug use: No  . Sexual  activity: Yes    Partners: Male    Birth control/protection: Surgical  Lifestyle  . Physical activity    Days per week: Not on file    Minutes per session: Not on file  . Stress: Not on file  Relationships  . Social Herbalist on phone: Not on file    Gets together: Not on file    Attends religious service: Not on file    Active member of club or organization: Not on file    Attends meetings of clubs or organizations: Not on file    Relationship status: Not on file  . Intimate partner violence    Fear of current or ex partner: Not on file    Emotionally abused:  Not on file    Physically abused: Not on file    Forced sexual activity: Not on file  Other Topics Concern  . Not on file  Social History Narrative   Widowed in 2012.  Lives with her daughter and two granddaughters.    Past Surgical History:  Procedure Laterality Date  . ABDOMINAL HYSTERECTOMY  12/11/10   TAH,RSO  . HYSTEROSCOPY  11/2000   HYSTEROSCOPIC MYOMECTOMY  . LAPAROSCOPIC CHOLECYSTECTOMY    . OOPHORECTOMY  12/11/10   TAH,RSO  . TUBAL LIGATION    . VEIN SURGERY      Family History  Problem Relation Age of Onset  . Hypertension Father   . Heart disease Father   . Hypertension Brother   . Cancer Mother        LUNG  . Cancer Maternal Grandmother 11       breast  . Breast cancer Maternal Grandmother   . Cancer Maternal Aunt 60       breast    No Known Allergies  Current Outpatient Medications on File Prior to Visit  Medication Sig Dispense Refill  . aspirin 81 MG tablet Take 81 mg by mouth daily.    Marland Kitchen CALCIUM PO Take 1 tablet by mouth daily.     Marland Kitchen losartan-hydrochlorothiazide (HYZAAR) 50-12.5 MG tablet Take 1 tablet by mouth daily. 90 tablet 1  . meloxicam (MOBIC) 7.5 MG tablet TAKE 1 TABLET BY MOUTH EVERY DAY 30 tablet 0  . Multiple Vitamins-Iron (MULTIVITAMIN/IRON PO) Take 1 tablet by mouth daily.     . Omega-3 Fatty Acids (FISH OIL BURP-LESS) 1000 MG CAPS Take by mouth.    . traMADol (ULTRAM) 50 MG tablet 1/2-1 tab po q 12 hours as needed pain 45 tablet 0  . zolpidem (AMBIEN) 5 MG tablet Take 1 tablet (5 mg total) by mouth at bedtime as needed for sleep. Can fill 03-25-19. 30 tablet 0   No current facility-administered medications on file prior to visit.     BP (!) 143/89   Pulse 90   Temp 97.8 F (36.6 C) (Temporal)   Resp 16   Ht 5\' 5"  (1.651 m)   Wt 193 lb (87.5 kg)   LMP 08/19/2010   SpO2 100%   BMI 32.12 kg/m       Objective:   Physical Exam   General Mental Status- Alert. General Appearance- Not in acute distress.   Skin General:  Color- Normal Color. Moisture- Normal Moisture. No worrisome lesions  Neck Carotid Arteries- Normal color. Moisture- Normal Moisture. No carotid bruits. No JVD.  Chest and Lung Exam Auscultation: Breath Sounds:-Normal.  Cardiovascular Auscultation:Rythm- Regular. Murmurs & Other Heart Sounds:Auscultation of the heart reveals- No Murmurs.  Abdomen Inspection:-Inspeection Normal. Palpation/Percussion:Note:No mass.  Palpation and Percussion of the abdomen reveal- Non Tender, Non Distended + BS, no rebound or guarding.  Neurologic Cranial Nerve exam:- CN III-XII intact(No nystagmus), symmetric smile. Strength:- 5/5 equal and symmetric strength both upper and lower extremities.    Assessment & Plan:  For you wellness exam today I have ordered cbc, cmp, tsh t4, b1, b12, vit d, lipid panel and hiv  Vaccine given today flu vaccine and tdap.  Recommend exercise and healthy diet.  We will let you know lab results as they come in.  Follow up date appointment will be determined after lab review.   Initial interview performed by nurse practitioner student Caleen Jobs. I did also in interview pt and modified note as necessary. Treatment and work up decisions made by myself.   Discussed pt fatigue and add labs, discussed hx of hysterectomy total no clear indication for papsmear,  rx for smoking cessation and hot flashes. [id number] charge in addition to wellness

## 2019-04-02 NOTE — Addendum Note (Signed)
Addended by: Hinton Dyer on: 04/02/2019 10:10 AM   Modules accepted: Orders

## 2019-04-05 ENCOUNTER — Encounter: Payer: Self-pay | Admitting: Gastroenterology

## 2019-04-05 LAB — VITAMIN D 1,25 DIHYDROXY
Vitamin D 1, 25 (OH)2 Total: 33 pg/mL (ref 18–72)
Vitamin D2 1, 25 (OH)2: <8 pg/mL
Vitamin D3 1, 25 (OH)2: 33 pg/mL

## 2019-04-05 LAB — HIV ANTIBODY (ROUTINE TESTING W REFLEX): HIV 1&2 Ab, 4th Generation: NONREACTIVE

## 2019-04-06 LAB — VITAMIN B1: Vitamin B1 (Thiamine): 18 nmol/L (ref 8–30)

## 2019-04-07 ENCOUNTER — Other Ambulatory Visit: Payer: Self-pay | Admitting: Medical

## 2019-04-10 ENCOUNTER — Other Ambulatory Visit: Payer: Self-pay | Admitting: Family Medicine

## 2019-04-10 NOTE — Telephone Encounter (Signed)
I will give her limited number tramadol. But ask her to set up phone visit. Just want to discuss reason for med. Number of tabs she will need on regular basis. Looks like 4 refill over past months. Want to explain need for contract and uds.  Will you call and explain.  Thanks

## 2019-04-10 NOTE — Telephone Encounter (Signed)
Requesting:tramadol Contract:yes UDS:low risk  Last OV:04/02/19 Next OV:04/02/20 Last Refill:02/22/20  #[id number] Database:   Please advise

## 2019-04-16 DIAGNOSIS — Z20828 Contact with and (suspected) exposure to other viral communicable diseases: Secondary | ICD-10-CM | POA: Diagnosis not present

## 2019-04-23 DIAGNOSIS — Z20828 Contact with and (suspected) exposure to other viral communicable diseases: Secondary | ICD-10-CM | POA: Diagnosis not present

## 2019-04-25 ENCOUNTER — Other Ambulatory Visit: Payer: Self-pay | Admitting: Medical

## 2019-04-26 NOTE — Telephone Encounter (Signed)
Rx wellbutrin refilled today.

## 2019-04-26 NOTE — Telephone Encounter (Signed)
Pt requesting 90 day supply on Bupropion. Please advise

## 2019-04-30 DIAGNOSIS — Z20828 Contact with and (suspected) exposure to other viral communicable diseases: Secondary | ICD-10-CM | POA: Diagnosis not present

## 2019-05-02 ENCOUNTER — Other Ambulatory Visit: Payer: Self-pay | Admitting: Medical

## 2019-05-04 ENCOUNTER — Other Ambulatory Visit: Payer: Self-pay | Admitting: Medical

## 2019-05-04 NOTE — Telephone Encounter (Signed)
Refill Request: Tramadol   Last RX:04/07/21 Last OV:04/02/19 Next OV:04/02/20  UDS: 01/02/19 CSC:01/02/19 CSR:

## 2019-05-05 NOTE — Telephone Encounter (Signed)
Refilled tramadol today. Controlled med site reviewed today.

## 2019-05-07 DIAGNOSIS — Z20828 Contact with and (suspected) exposure to other viral communicable diseases: Secondary | ICD-10-CM | POA: Diagnosis not present

## 2019-05-11 ENCOUNTER — Encounter: Payer: BLUE CROSS/BLUE SHIELD | Admitting: Gastroenterology

## 2019-05-14 DIAGNOSIS — Z20828 Contact with and (suspected) exposure to other viral communicable diseases: Secondary | ICD-10-CM | POA: Diagnosis not present

## 2019-05-21 DIAGNOSIS — Z20828 Contact with and (suspected) exposure to other viral communicable diseases: Secondary | ICD-10-CM | POA: Diagnosis not present

## 2019-05-29 DIAGNOSIS — Z20828 Contact with and (suspected) exposure to other viral communicable diseases: Secondary | ICD-10-CM | POA: Diagnosis not present

## 2019-06-04 DIAGNOSIS — Z20828 Contact with and (suspected) exposure to other viral communicable diseases: Secondary | ICD-10-CM | POA: Diagnosis not present

## 2019-06-15 DIAGNOSIS — Z720 Tobacco use: Secondary | ICD-10-CM | POA: Diagnosis not present

## 2019-06-18 DIAGNOSIS — Z20828 Contact with and (suspected) exposure to other viral communicable diseases: Secondary | ICD-10-CM | POA: Diagnosis not present

## 2019-06-21 ENCOUNTER — Other Ambulatory Visit: Payer: Self-pay

## 2019-06-21 ENCOUNTER — Encounter: Payer: Self-pay | Admitting: Medical

## 2019-06-21 MED ORDER — TRAMADOL HCL 50 MG PO TABS: ORAL_TABLET | ORAL | 0 refills | Status: DC

## 2019-06-21 MED ORDER — MELOXICAM 7.5 MG PO TABS: 7.5000 mg | ORAL_TABLET | Freq: Every day | ORAL | 0 refills | Status: DC

## 2019-06-21 MED ORDER — LOSARTAN POTASSIUM-HCTZ 50-12.5 MG PO TABS: 1.0000 | ORAL_TABLET | Freq: Every day | ORAL | 1 refills | Status: DC

## 2019-06-21 NOTE — Progress Notes (Signed)
Refill request: Tramadol    Last RX:05/05/19 Last OV:04/02/19 Next OV:04/02/20 UDS:01/02/19 CSC:01/02/19 CSR:06/21/2019  Refilled med today.  Mackie Pai, PA-C

## 2019-06-22 ENCOUNTER — Ambulatory Visit: Payer: BLUE CROSS/BLUE SHIELD | Admitting: Medical

## 2019-06-22 ENCOUNTER — Other Ambulatory Visit: Payer: Self-pay

## 2019-06-25 ENCOUNTER — Other Ambulatory Visit: Payer: Self-pay

## 2019-06-25 ENCOUNTER — Encounter: Payer: Self-pay | Admitting: Medical

## 2019-06-25 ENCOUNTER — Ambulatory Visit (INDEPENDENT_AMBULATORY_CARE_PROVIDER_SITE_OTHER): Payer: BLUE CROSS/BLUE SHIELD | Admitting: Medical

## 2019-06-25 VITALS — BP 132/84 | HR 83 | Temp 98.1°F | Ht 65.0 in | Wt 192.0 lb

## 2019-06-25 DIAGNOSIS — M25562 Pain in left knee: Secondary | ICD-10-CM

## 2019-06-25 DIAGNOSIS — I10 Essential (primary) hypertension: Secondary | ICD-10-CM | POA: Diagnosis not present

## 2019-06-25 DIAGNOSIS — M5442 Lumbago with sciatica, left side: Secondary | ICD-10-CM | POA: Diagnosis not present

## 2019-06-25 DIAGNOSIS — E785 Hyperlipidemia, unspecified: Secondary | ICD-10-CM

## 2019-06-25 DIAGNOSIS — M25561 Pain in right knee: Secondary | ICD-10-CM | POA: Diagnosis not present

## 2019-06-25 DIAGNOSIS — Z79899 Other long term (current) drug therapy: Secondary | ICD-10-CM

## 2019-06-25 DIAGNOSIS — Z1211 Encounter for screening for malignant neoplasm of colon: Secondary | ICD-10-CM

## 2019-06-25 MED ORDER — CLONAZEPAM 0.5 MG PO TABS: 0.5000 mg | ORAL_TABLET | Freq: Two times a day (BID) | ORAL | 0 refills | Status: DC | PRN

## 2019-06-25 MED ORDER — ZOLPIDEM TARTRATE 5 MG PO TABS: 5.0000 mg | ORAL_TABLET | Freq: Every evening | ORAL | 1 refills | Status: DC | PRN

## 2019-06-25 NOTE — Patient Instructions (Signed)
For insomnia refilled ambien rx. Continue to use sparingly.  For knee pain and back pain, use meloxicam and tramadol as back up for break thru pain.  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 Rx advisement givne.  For smoking cessation restart wellbutrin. Rx advisment given.   Follow up in 3 months or as needed

## 2019-06-25 NOTE — Progress Notes (Signed)
   Subjective:    Patient ID: Laurie Chang, female    DOB: [birth year], 53 y.o.   MRN: [id number]  HPI  Virtual Visit via Video Note  I connected with Laurie Chang on 06/25/19 at  2:00 PM EST by a video enabled telemedicine application and verified that I am speaking with the correct person using two identifiers.  Location: Patient: home Provider: office   I discussed the limitations of evaluation and management by telemedicine and the availability of in person appointments. The patient expressed understanding and agreed to proceed.  History of Present Illness:   Pt has insomonia. She does not use ambien every night and sometimes just uses 1/2 tab at night.  Pt states she is worried about getting covid. She works around covid persons. She keeps testing herself daily and the test comes back negative each day. Pt wears ppe all the time. If she gets slight ha or even cough once gets scared.   Pt bp is controlled today on second check bp.  Pt has back pain and knee pain. On tramadol. Pt is also on meloxicam.  Pt is smoker. She was cutting back on smoking up until her husband got subdural hematoma.      Observations/Objective: General-no acute distress, pleasant, oriented. Lungs- on inspection lungs appear unlabored. Neck- no tracheal deviation or jvd on inspection. Neuro- gross motor function appears intact.  Assessment and Plan: For insomnia refilled ambien rx. Continue to use sparingly.  For knee pain and back pain, use meloxicam and tramadol as back up for break thru pain.  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 Rx advisement givne.  For smoking cessation restart wellbutrin. Rx advisment given.   Follow up in 3 months or as needed  Mackie Pai, PA-C  Follow Up Instructions:    I discussed the assessment and treatment plan with the patient. The patient was provided an  opportunity to ask questions and all were answered. The patient agreed with the plan and demonstrated an understanding of the instructions.   The patient was advised to call back or seek an in-person evaluation if the symptoms worsen or if the condition fails to improve as anticipated.  I provided 25 minutes of non-face-to-face time during this encounter.   Mackie Pai, PA-C   Review of Systems  Constitutional: Negative for chills, fatigue and fever.  HENT: Negative for congestion and dental problem.   Respiratory: Negative for cough, chest tightness, shortness of breath and wheezing.   Cardiovascular: Negative for chest pain and palpitations.  Gastrointestinal: Negative for abdominal pain, constipation, nausea and vomiting.  Genitourinary: Negative for dysuria.  Musculoskeletal: Negative for back pain and myalgias.  Skin: Negative for rash.  Neurological: Negative for dizziness, speech difficulty, weakness and headaches.  Hematological: Negative for adenopathy. Bruises/bleeds easily.  Psychiatric/Behavioral: Negative for behavioral problems, decreased concentration, dysphoric mood and suicidal ideas. The patient is nervous/anxious.        Objective:   Physical Exam        Assessment & Plan:

## 2019-06-27 DIAGNOSIS — Z20828 Contact with and (suspected) exposure to other viral communicable diseases: Secondary | ICD-10-CM | POA: Diagnosis not present

## 2019-07-02 ENCOUNTER — Encounter: Payer: Self-pay | Admitting: Medical

## 2019-07-05 ENCOUNTER — Other Ambulatory Visit: Payer: Self-pay | Admitting: Medical

## 2019-07-19 ENCOUNTER — Other Ambulatory Visit: Payer: Self-pay | Admitting: Medical

## 2019-07-20 NOTE — Telephone Encounter (Signed)
I refilled pt meloxicam. It looks like she is taking medication daily. Would encourage not to use it daily/more like every other day if possible. Since nsaids can effect kidney function. I think I saw in epic her next appointment  Is 03-2020. Would want her to follow up sooner more like 3 months so can check kidney function at that time.

## 2019-07-25 DIAGNOSIS — S92505A Nondisplaced unspecified fracture of left lesser toe(s), initial encounter for closed fracture: Secondary | ICD-10-CM | POA: Diagnosis not present

## 2019-08-12 ENCOUNTER — Other Ambulatory Visit: Payer: Self-pay | Admitting: Medical

## 2019-08-13 ENCOUNTER — Encounter: Payer: Self-pay | Admitting: Gastroenterology

## 2019-08-14 ENCOUNTER — Other Ambulatory Visit: Payer: Self-pay | Admitting: Medical

## 2019-08-14 NOTE — Telephone Encounter (Addendum)
Requesting:Tramadol  Contract:01/02/19 UDS:01/02/19 Last OV:06/25/19 Next OV:04/02/20 Last Refill:06/21/19 #15tab 0rf Database: 08/15/2019   Please advise  Rx sent to pt pharmacy.

## 2019-08-24 ENCOUNTER — Other Ambulatory Visit: Payer: Self-pay | Admitting: Medical

## 2019-08-24 ENCOUNTER — Other Ambulatory Visit (INDEPENDENT_AMBULATORY_CARE_PROVIDER_SITE_OTHER): Payer: BLUE CROSS/BLUE SHIELD

## 2019-08-24 ENCOUNTER — Other Ambulatory Visit: Payer: Self-pay

## 2019-08-24 DIAGNOSIS — E785 Hyperlipidemia, unspecified: Secondary | ICD-10-CM

## 2019-08-24 LAB — COMPREHENSIVE METABOLIC PANEL
ALT: 20 U/L (ref 0–35)
AST: 18 U/L (ref 0–37)
Albumin: 4.2 g/dL (ref 3.5–5.2)
Alkaline Phosphatase: 58 U/L (ref 39–117)
BUN: 17 mg/dL (ref 6–23)
CO2: 25 mEq/L (ref 19–32)
Calcium: 9.3 mg/dL (ref 8.4–10.5)
Chloride: 106 mEq/L (ref 96–112)
Creatinine, Ser: 0.8 mg/dL (ref 0.40–1.20)
GFR: 74.31 mL/min (ref >60.00)
Glucose, Bld: 100 mg/dL — ABNORMAL HIGH (ref 70–99)
Potassium: 4.1 mEq/L (ref 3.5–5.1)
Sodium: 140 mEq/L (ref 135–145)
Total Bilirubin: 0.5 mg/dL (ref 0.2–1.2)
Total Protein: 6.8 g/dL (ref 6.0–8.3)

## 2019-08-26 NOTE — Telephone Encounter (Signed)
Rx meloxicam sent to pt pharmacy.

## 2019-08-27 ENCOUNTER — Ambulatory Visit: Payer: BLUE CROSS/BLUE SHIELD | Admitting: Podiatry

## 2019-08-27 ENCOUNTER — Ambulatory Visit (INDEPENDENT_AMBULATORY_CARE_PROVIDER_SITE_OTHER): Payer: BLUE CROSS/BLUE SHIELD

## 2019-08-27 ENCOUNTER — Ambulatory Visit (AMBULATORY_SURGERY_CENTER): Payer: Self-pay | Admitting: *Deleted

## 2019-08-27 ENCOUNTER — Other Ambulatory Visit: Payer: Self-pay

## 2019-08-27 ENCOUNTER — Other Ambulatory Visit: Payer: Self-pay | Admitting: Podiatry

## 2019-08-27 VITALS — Temp 98.4°F | Ht 65.0 in | Wt 202.0 lb

## 2019-08-27 DIAGNOSIS — M722 Plantar fascial fibromatosis: Secondary | ICD-10-CM

## 2019-08-27 DIAGNOSIS — M216X9 Other acquired deformities of unspecified foot: Secondary | ICD-10-CM

## 2019-08-27 DIAGNOSIS — M79672 Pain in left foot: Secondary | ICD-10-CM

## 2019-08-27 DIAGNOSIS — M79671 Pain in right foot: Secondary | ICD-10-CM

## 2019-08-27 DIAGNOSIS — Z01818 Encounter for other preprocedural examination: Secondary | ICD-10-CM

## 2019-08-27 DIAGNOSIS — M729 Fibroblastic disorder, unspecified: Secondary | ICD-10-CM

## 2019-08-27 DIAGNOSIS — M7731 Calcaneal spur, right foot: Secondary | ICD-10-CM

## 2019-08-27 DIAGNOSIS — M7732 Calcaneal spur, left foot: Secondary | ICD-10-CM

## 2019-08-27 DIAGNOSIS — Z1211 Encounter for screening for malignant neoplasm of colon: Secondary | ICD-10-CM

## 2019-08-27 MED ORDER — CLENPIQ 10-3.5-12 MG-GM -GM/160ML PO SOLN: 1.0000 | ORAL | 0 refills | Status: DC

## 2019-08-27 NOTE — Progress Notes (Signed)
  Subjective:  Patient ID: Laurie Chang, female    DOB: Nov 29, 1965,  MRN: [id number]  Chief Complaint  Patient presents with  . Foot Pain    BL bottm heel pain (L>R) x months; 8/10 shapr constant pain -wose form sitting to standing -pt dneis injury/swellgin tx: icing and OTC inserts   . Nail Problem    Rt hallux thic and discolored x 1 yr; no pain Tx: vicks, OTC nail tx    54 y.o. female presents with the above complaint. History confirmed with patient.   Objective:  Physical Exam: warm, good capillary refill, no trophic changes or ulcerative lesions, normal DP and PT pulses and normal sensory exam. Left Foot: tenderness to palpation medial calcaneal tuber, no pain with calcaneal squeeze, decreased ankle joint ROM and +Silverskiold test Right Foot: tenderness to palpation medial calcaneal tuber, no pain with calcaneal squeeze, decreased ankle joint ROM and +Silverskiold test  Radiographs: X-ray of both feet: no fracture, dislocation, swelling or degenerative changes noted and heel spurs noted bilat   Assessment:   1. Plantar fasciitis   2. Calcaneal spur of left foot   3. Calcaneal spur of right foot   4. Equinus deformity of foot    Plan:  Patient was evaluated and treated and all questions answered.  Plantar Fasciitis -XR reviewed with patient -Educated patient on stretching and icing of the affected limb -Plantar fascial brace dispensed x2 -Injection delivered to the plantar fascia of both feet.  Procedure: Injection Tendon/Ligament Consent: Verbal consent obtained. Location: Bilateral plantar fascia at the glabrous junction; medial approach. Skin Prep: Alcohol. Injectate: 1 cc 0.5% marcaine plain, 1 cc dexamethasone phosphate, 0.5 cc kenalog 10. Disposition: Patient tolerated procedure well. Injection site dressed with a band-aid.  Return in about 3 weeks (around 09/17/2019) for Plantar fasciitis, Bilateral.

## 2019-08-27 NOTE — Progress Notes (Signed)
Patient is here in-person for PV. Patient denies any allergies to eggs or soy. Patient denies any problems with anesthesia/sedation. Patient denies any oxygen use at home. Patient denies taking any diet/weight loss medications or blood thinners. Patient is not being treated for MRSA or C-diff. EMMI education assisgned to the patient for the procedure, this was explained and instructions given to patient. COVID-19 screening test is on 2/23, the pt is aware. Pt is aware that care partner will wait in the car during procedure; if they feel like they will be too hot or cold to wait in the car; they may wait in the 4 th floor lobby. Patient is aware to bring only one care partner. We want them to wear a mask (we do not have any that we can provide them), practice social distancing, and we will check their temperatures when they get here.  I did remind the patient that their care partner needs to stay in the parking lot the entire time and have a cell phone available, we will call them when the pt is ready for discharge. Patient will wear mask into building.    Clenpiq coupon given to the patient.

## 2019-08-27 NOTE — Patient Instructions (Signed)

## 2019-08-29 ENCOUNTER — Other Ambulatory Visit: Payer: Self-pay | Admitting: Podiatry

## 2019-08-29 DIAGNOSIS — M722 Plantar fascial fibromatosis: Secondary | ICD-10-CM

## 2019-08-29 DIAGNOSIS — M729 Fibroblastic disorder, unspecified: Secondary | ICD-10-CM

## 2019-09-10 ENCOUNTER — Encounter: Payer: Self-pay | Admitting: Gastroenterology

## 2019-09-11 ENCOUNTER — Other Ambulatory Visit: Payer: Self-pay

## 2019-09-11 ENCOUNTER — Ambulatory Visit (INDEPENDENT_AMBULATORY_CARE_PROVIDER_SITE_OTHER): Payer: BLUE CROSS/BLUE SHIELD

## 2019-09-11 ENCOUNTER — Other Ambulatory Visit: Payer: Self-pay | Admitting: Gastroenterology

## 2019-09-11 DIAGNOSIS — Z1159 Encounter for screening for other viral diseases: Secondary | ICD-10-CM

## 2019-09-12 LAB — SARS CORONAVIRUS 2 (TAT 6-24 HRS): SARS Coronavirus 2: NEGATIVE

## 2019-09-14 ENCOUNTER — Ambulatory Visit (AMBULATORY_SURGERY_CENTER): Payer: BLUE CROSS/BLUE SHIELD | Admitting: Gastroenterology

## 2019-09-14 ENCOUNTER — Encounter: Payer: Self-pay | Admitting: Gastroenterology

## 2019-09-14 ENCOUNTER — Other Ambulatory Visit: Payer: Self-pay

## 2019-09-14 VITALS — BP 129/76 | HR 69 | Temp 97.3°F | Resp 18 | Ht 65.0 in | Wt 202.0 lb

## 2019-09-14 DIAGNOSIS — D123 Benign neoplasm of transverse colon: Secondary | ICD-10-CM

## 2019-09-14 DIAGNOSIS — D127 Benign neoplasm of rectosigmoid junction: Secondary | ICD-10-CM | POA: Diagnosis not present

## 2019-09-14 DIAGNOSIS — D125 Benign neoplasm of sigmoid colon: Secondary | ICD-10-CM | POA: Diagnosis not present

## 2019-09-14 DIAGNOSIS — Z8371 Family history of colonic polyps: Secondary | ICD-10-CM

## 2019-09-14 DIAGNOSIS — Z1211 Encounter for screening for malignant neoplasm of colon: Secondary | ICD-10-CM

## 2019-09-14 DIAGNOSIS — D128 Benign neoplasm of rectum: Secondary | ICD-10-CM | POA: Diagnosis not present

## 2019-09-14 MED ORDER — SODIUM CHLORIDE 0.9 % IV SOLN: 500.0000 mL | Freq: Once | INTRAVENOUS | Status: DC

## 2019-09-14 NOTE — Patient Instructions (Signed)
Handouts on polyps, hemorrhoids, diverticulosis, and high fiber diet given to you today  Await pathology results  NO ASPIRIN, ASPIRIN CONTAINING PRODUCTS (BC OR GOODY POWDERS) OR NSAIDS (IBUPROFEN, ADVIL, ALEVE, AND MOTRIN) FOR 5 days; TYLENOL IS OK TO TAKE    YOU HAD AN ENDOSCOPIC PROCEDURE TODAY AT THE [facility]:   Refer to the procedure report that was given to you for any specific questions about what was found during the examination.  If the procedure report does not answer your questions, please call your gastroenterologist to clarify.  If you requested that your care partner not be given the details of your procedure findings, then the procedure report has been included in a sealed envelope for you to review at your convenience later.  YOU SHOULD EXPECT: Some feelings of bloating in the abdomen. Passage of more gas than usual.  Walking can help get rid of the air that was put into your GI tract during the procedure and reduce the bloating. If you had a lower endoscopy (such as a colonoscopy or flexible sigmoidoscopy) you may notice spotting of blood in your stool or on the toilet paper. If you underwent a bowel prep for your procedure, you may not have a normal bowel movement for a few days.  Please Note:  You might notice some irritation and congestion in your nose or some drainage.  This is from the oxygen used during your procedure.  There is no need for concern and it should clear up in a day or so.  SYMPTOMS TO REPORT IMMEDIATELY:   Following lower endoscopy (colonoscopy or flexible sigmoidoscopy):  Excessive amounts of blood in the stool  Significant tenderness or worsening of abdominal pains  Swelling of the abdomen that is new, acute  Fever of 100F or higher  For urgent or emergent issues, a gastroenterologist can be reached at any hour by calling [phone].   DIET:  We do recommend a small meal at first, but then you may proceed to your regular diet.   Drink plenty of fluids but you should avoid alcoholic beverages for 24 hours.  ACTIVITY:  You should plan to take it easy for the rest of today and you should NOT DRIVE or use heavy machinery until tomorrow (because of the sedation medicines used during the test).    FOLLOW UP: Our staff will call the number listed on your records 48-72 hours following your procedure to check on you and address any questions or concerns that you may have regarding the information given to you following your procedure. If we do not reach you, we will leave a message.  We will attempt to reach you two times.  During this call, we will ask if you have developed any symptoms of COVID 19. If you develop any symptoms (ie: fever, flu-like symptoms, shortness of breath, cough etc.) before then, please call [phone].  If you test positive for Covid 19 in the 2 weeks post procedure, please call and report this information to [place].    If any biopsies were taken you will be contacted by phone or by letter within the next 1-3 weeks.  Please call us at [phone] if you have not heard about the biopsies in 3 weeks.    SIGNATURES/CONFIDENTIALITY: You and/or your care partner have signed paperwork which will be entered into your electronic medical record.  These signatures attest to the fact that that the information above on your After Visit Summary has been reviewed and is  understood.  Full responsibility of the confidentiality of this discharge information lies with you and/or your care-partner. 

## 2019-09-14 NOTE — Progress Notes (Signed)
Called to room to assist during endoscopic procedure.  Patient ID and intended procedure confirmed with present staff. Received instructions for my participation in the procedure from the performing physician.  

## 2019-09-14 NOTE — Op Note (Signed)
[place] Patient Name: Laurie Chang Procedure Date: 09/14/2019 8:26 AM MRN: [id number] Endoscopist: Jackquline Denmark , MD Age: 54 Referring MD:  Date of Birth: [birth year] Gender: Female Account #: [id number] Procedure:                Colonoscopy Indications:              Colon cancer screening in patient at increased                            risk: Family history of 1st-degree relative with                            colon polyps (brother and sister) Medicines:                Monitored Anesthesia Care Procedure:                Pre-Anesthesia Assessment:                           - Prior to the procedure, a History and Physical                            was performed, and patient medications and                            allergies were reviewed. The patient's tolerance of                            previous anesthesia was also reviewed. The risks                            and benefits of the procedure and the sedation                            options and risks were discussed with the patient.                            All questions were answered, and informed consent                            was obtained. Prior Anticoagulants: The patient has                            taken no previous anticoagulant or antiplatelet                            agents. ASA Grade Assessment: II - A patient with                            mild systemic disease. After reviewing the risks                            and benefits, the patient was deemed in  satisfactory condition to undergo the procedure.                           After obtaining informed consent, the colonoscope                            was passed under direct vision. Throughout the                            procedure, the patient's blood pressure, pulse, and                            oxygen saturations were monitored continuously. The                            Colonoscope was introduced  through the anus and                            advanced to the 2 cm into the ileum. The                            colonoscopy was performed without difficulty. The                            patient tolerated the procedure well. The quality                            of the bowel preparation was adequate to identify                            polyps. The terminal ileum, ileocecal valve,                            appendiceal orifice, and rectum were photographed. Scope In: 8:28:50 AM Scope Out: 8:50:10 AM Scope Withdrawal Time: 0 hours 17 minutes 58 seconds  Total Procedure Duration: 0 hours 21 minutes 20 seconds  Findings:                 Five sessile polyps were found in the rectum,                            sigmoid colon and hepatic flexure. The polyps were                            4 to 6 mm in size. These polyps were removed with a                            cold snare. Resection and retrieval were complete.                            Estimated blood loss: none.                           A 10 mm polyp  was found in the mid sigmoid colon.                            The polyp was sessile. The polyp was removed with a                            hot snare. Resection and retrieval were complete.                           Multiple medium-mouthed diverticula were found in                            the sigmoid colon, few in descending colon and                            ascending colon.                           Non-bleeding internal hemorrhoids were found during                            retroflexion. The hemorrhoids were small.                           The terminal ileum appeared normal.                           The exam was otherwise without abnormality on                            direct and retroflexion views. Complications:            No immediate complications. Estimated Blood Loss:     Estimated blood loss: none. Impression:               -Colonic polyps s/p polypectomy                            -Pancolonic diverticulosis predominantly in the                            sigmoid colon.                           -Otherwise normal colonoscopy to TI. Recommendation:           - Patient has a contact number available for                            emergencies. The signs and symptoms of potential                            delayed complications were discussed with the                            patient. Return to normal activities tomorrow.  Written discharge instructions were provided to the                            patient.                           - High fiber diet.                           - Continue present medications.                           - No aspirin, ibuprofen, naproxen, or other                            non-steroidal anti-inflammatory drugs for 5 days                            after polyp removal.                           - Await pathology results.                           - Repeat colonoscopy for surveillance based on                            pathology results.                           - The findings and recommendations were discussed                            with the patient's family. Jackquline Denmark, MD 09/14/2019 8:57:53 AM This report has been signed electronically.

## 2019-09-14 NOTE — Progress Notes (Signed)
To PACU, VSS. Report to [email or web address] 

## 2019-09-14 NOTE — Progress Notes (Signed)
Pt's states no medical or surgical changes since previsit or office visit.  [place]

## 2019-09-17 ENCOUNTER — Ambulatory Visit: Payer: BLUE CROSS/BLUE SHIELD | Admitting: Podiatry

## 2019-09-18 ENCOUNTER — Telehealth: Payer: Self-pay

## 2019-09-18 NOTE — Telephone Encounter (Signed)
  Follow up Call-  Call back number 09/14/2019  Post procedure Call Back phone  # [phone]  Permission to leave phone message Yes  Some recent data might be hidden     Patient questions:  Do you have a fever, pain , or abdominal swelling? No. Pain Score  0 *  Have you tolerated food without any problems? Yes.    Have you been able to return to your normal activities? Yes.    Do you have any questions about your discharge instructions: Diet   No. Medications  No. Follow up visit  No.  Do you have questions or concerns about your Care? Yes.   Some gas.  Stated she started taking fiber yesterday.  Encouraged to move more, sip on warm liquids and if very bothered try anti-gas otc meds.  If a significant issue call again. Actions: * If pain score is 4 or above: No action needed, pain <4. 1. Have you developed a fever since your procedure? no  2.   Have you had an respiratory symptoms (SOB or cough) since your procedure? no  3.   Have you tested positive for COVID 19 since your procedure no  4.   Have you had any family members/close contacts diagnosed with the COVID 19 since your procedure?  no   If yes to any of these questions please route to Joylene John, RN and Alphonsa Gin, Therapist, sports.

## 2019-09-19 ENCOUNTER — Encounter: Payer: Self-pay | Admitting: Gastroenterology

## 2019-09-20 ENCOUNTER — Other Ambulatory Visit: Payer: Self-pay | Admitting: Medical

## 2019-09-20 NOTE — Telephone Encounter (Addendum)
Requesting: tramadol Contract:01/08/19 UDS:01/02/19 Last Visit:06/25/19 Next Visit:04/02/20 Last Refill:  Please Advise  C med site reviewed. Sent in refill.  Mackie Pai, PA-C

## 2019-09-23 MED ORDER — TRAMADOL HCL 50 MG PO TABS: ORAL_TABLET | ORAL | 0 refills | Status: DC

## 2019-09-23 NOTE — Addendum Note (Signed)
Addended by: Anabel Halon on: 09/23/2019 10:40 AM   Modules accepted: Orders

## 2019-10-04 ENCOUNTER — Encounter: Payer: Self-pay | Admitting: Family Medicine

## 2019-10-04 ENCOUNTER — Ambulatory Visit (INDEPENDENT_AMBULATORY_CARE_PROVIDER_SITE_OTHER): Payer: BLUE CROSS/BLUE SHIELD | Admitting: Family Medicine

## 2019-10-04 ENCOUNTER — Ambulatory Visit (HOSPITAL_BASED_OUTPATIENT_CLINIC_OR_DEPARTMENT_OTHER)
Admission: RE | Admit: 2019-10-04 | Discharge: 2019-10-04 | Disposition: A | Discharge status: Home / Self Care | Payer: BLUE CROSS/BLUE SHIELD | Source: Ambulatory Visit | Attending: Family Medicine | Admitting: Family Medicine

## 2019-10-04 ENCOUNTER — Other Ambulatory Visit: Payer: Self-pay

## 2019-10-04 ENCOUNTER — Encounter: Payer: Self-pay | Admitting: Medical

## 2019-10-04 DIAGNOSIS — R1030 Lower abdominal pain, unspecified: Secondary | ICD-10-CM | POA: Diagnosis not present

## 2019-10-04 DIAGNOSIS — R109 Unspecified abdominal pain: Secondary | ICD-10-CM | POA: Diagnosis not present

## 2019-10-04 MED ORDER — IOHEXOL 300 MG/ML  SOLN
100.0000 mL | Freq: Once | INTRAMUSCULAR | Status: AC | PRN
  Administered 2019-10-04: 100 mL via INTRAVENOUS

## 2019-10-04 MED ORDER — AMOXICILLIN-POT CLAVULANATE 875-125 MG PO TABS: 1.0000 | ORAL_TABLET | Freq: Two times a day (BID) | ORAL | 0 refills | Status: DC

## 2019-10-04 NOTE — Progress Notes (Addendum)
[place] at [facility] [address] [place], [place] [address] [phone]  Date:  10/04/2019   Name:  Laurie Chang   DOB:  September 03, 1965   MRN:  [id number]  PCP:  Mackie Pai, PA-C    Chief Complaint: No chief complaint on file.   History of Present Illness:  Laurie Chang is a 54 y.o. very pleasant female patient who presents with the following:  Primary patient of my partner Mackie Pai I have seen her in the past, most recently in 2016 She has history of hypertension, hyperlipidemia  Virtual visit today due to illness.  Patient location is home, provider location is office.  Patient identity confirmed with 2 factors, she gives consent for virtual visit today.  The patient and myself are present on the call today  Today she notes sx of a "stomach virus" all week- started on Monday and today is thursday She has felt nauseated all week Today she noted pain in her lower abdomen-she is tender across the entire lower abdomen  She has possible history of diverticulitis; she did have diverticula seen on CT in 2016  She is not vomiting No diarrhea She is eating normally She just had a BM that did help some  Her pain is most in the central lower abdomen No urinary sx- no pain with urination, no hematuria, no change in her urinary pattern  No fever noted   She is s/p hysterectomy  Patient Active Problem List   Diagnosis Date Noted  . Cigar smoker motivated to quit 03/14/2013  . Weight gain 03/14/2013  . HSV-2 seropositive 12/06/2012  . Microhematuria 11/22/2011    Past Medical History:  Diagnosis Date  . Arthritis   . Asthma    as a child   . Hyperlipidemia   . Hypertension   . Leiomyoma uteri   . Smoker    1/2 PPD  . Syncope and collapse     Past Surgical History:  Procedure Laterality Date  . ABDOMINAL HYSTERECTOMY  12/11/10   TAH,RSO  . HYSTEROSCOPY  11/2000   HYSTEROSCOPIC MYOMECTOMY  .  LAPAROSCOPIC CHOLECYSTECTOMY    . OOPHORECTOMY  12/11/10   TAH,RSO  . TUBAL LIGATION    . VEIN SURGERY      Social History   Tobacco Use  . Smoking status: Current Every Day Smoker    Packs/day: 0.50    Years: 28.00    Pack years: 14.00    Types: Cigarettes  . Smokeless tobacco: Never Used  Substance Use Topics  . Alcohol use: Not Currently    Comment: once in a blue moon  . Drug use: No    Family History  Problem Relation Age of Onset  . Hypertension Father   . Heart disease Father   . Hypertension Brother   . Cancer Mother        LUNG  . Cancer Maternal Grandmother 54       breast  . Breast cancer Maternal Grandmother   . Cancer Maternal Aunt 60       breast  . Colon cancer Neg Hx   . Colon polyps Neg Hx   . Esophageal cancer Neg Hx   . Stomach cancer Neg Hx   . Rectal cancer Neg Hx     No Known Allergies  Medication list has been reviewed and updated.  Current Outpatient Medications on File Prior to Visit  Medication Sig Dispense Refill  .  aspirin 81 MG tablet Take 81 mg by mouth daily.    Marland Kitchen atorvastatin (LIPITOR) 10 MG tablet TAKE 1 TABLET BY MOUTH EVERY DAY 90 tablet 1  . losartan-hydrochlorothiazide (HYZAAR) 50-12.5 MG tablet Take 1 tablet by mouth daily. 90 tablet 1  . meloxicam (MOBIC) 7.5 MG tablet TAKE 1 TABLET BY MOUTH EVERY DAY 30 tablet 2  . Multiple Vitamins-Minerals (ONE-A-DAY WOMENS PO) Take by mouth.    . traMADol (ULTRAM) 50 MG tablet 1 tab po q 6 hours as needed for pain 15 tablet 0  . zolpidem (AMBIEN) 5 MG tablet Take 1 tablet (5 mg total) by mouth at bedtime as needed for sleep. 30 tablet 1   No current facility-administered medications on file prior to visit.    Review of Systems:  As per HPI- otherwise negative.   Physical Examination: There were no vitals filed for this visit. There were no vitals filed for this visit. There is no height or weight on file to calculate BMI. Ideal Body Weight:    Pt observed over video- looks  well  No cough, wheezing, distress is noted BP 142/72, pulse 92 AF  Assessment and Plan: Lower abdominal pain - Plan: amoxicillin-clavulanate (AUGMENTIN) 875-125 MG tablet, CT Abdomen Pelvis W Contrast  Pt with lower abd pain, known history of diverticulosis.  Consider appendicitis vs diverticulitis.  Would like to get outpt CT scan to guide tx- order now.    If uncomplicated diverticulitis plan to treat with augmentin, low residue or liquid only diet for a few days  Signed Lamar Blinks, MD  Received her CT report at 7:20 PM, as below Called patient to discuss with her Reported no appendicitis, no diverticulitis.  Patient is relieved, we will have her try antibiotics and a low residue diet for a few days  She will keep me posted about her progress via MyChart  We will send copy of her report to her via MyChart   CT Abdomen Pelvis W Contrast  Result Date: 10/04/2019 CLINICAL DATA:  Appendicitis suspected. Right lower quadrant abdominal pain. EXAM: CT ABDOMEN AND PELVIS WITH CONTRAST TECHNIQUE: Multidetector CT imaging of the abdomen and pelvis was performed using the standard protocol following bolus administration of intravenous contrast. Automatic exposure control utilized. CONTRAST:  172mL OMNIPAQUE IOHEXOL 300 MG/ML  SOLN COMPARISON:  August 16, 2014 FINDINGS: Lower chest: Borderline cardiomegaly with mild coronary calcification. No dependent pleural fluid or consolidation. Hepatobiliary: Cholecystectomy. Mild intrahepatic and 7 mm proximal common bile duct dilatation with normal distal tapering of the common bile duct. Air foci within the distal common bile duct, likely sphincterotomy. Borderline hepatomegaly. Patent portal veins. Normal smooth hepatic contours. Pancreas: Normal. Spleen: Normal. Adrenals/Urinary Tract: Normal right and left adrenal glands. An 8 mm simple-appearing larger density right renal lower pole cortical cyst, and otherwise normal appearances of the right kidney  and ureter. Normal appearances of the left kidney and ureter. Stomach/Bowel: Normal appendix (series 2, image 65). Large amount of stool throughout the colon. Sigmoid diverticulosis without acute diverticulitis. No bowel obstruction. Short segments of tandem distal ileal bowel loops that are nondistended or non distensible without adjacent inflammatory change. Ingested contents within the gastric lumen without an apparent gastric abnormality. Vascular/Lymphatic: Thoracoabdominal aorta calcified atherosclerosis without an abdominal aorta aneurysm or dissection. No adenopathy. Reproductive: Hysterectomy. A left ovarian 19 mm simple-appearing cyst, likely a benign cyst based on size criteria; no further imaging characterization is recommended. Other: No free intraperitoneal fluid or air. Musculoskeletal: Diffuse bone demineralization. Small intravertebral disc  herniation at L1 and T7 through T12 levels. Moderate degenerative changes. Grade 1 retrolisthesis of L1 on L2, likely degenerative. IMPRESSION: Normal appendix. Short segments of tendon distal ileal bowel loops that are nondistended or non-distensible without adjacent inflammatory change. A left ovarian 19 mm and a right renal 8 mm simple-appearing cyst, likely benign cysts based on size criteria; no further imaging characterization is recommended unless clinically indicated. Sigmoid diverticulosis. Thoracoabdominal aortic calcified atherosclerosis. Electronically Signed   By: Revonda Humphrey   On: 10/04/2019 19:11

## 2019-10-05 ENCOUNTER — Ambulatory Visit: Payer: BLUE CROSS/BLUE SHIELD | Admitting: Medical

## 2019-10-15 ENCOUNTER — Encounter: Payer: Self-pay | Admitting: Medical

## 2019-10-16 ENCOUNTER — Ambulatory Visit: Payer: BLUE CROSS/BLUE SHIELD | Admitting: Family Medicine

## 2019-10-16 ENCOUNTER — Encounter: Payer: Self-pay | Admitting: Family Medicine

## 2019-10-16 ENCOUNTER — Other Ambulatory Visit: Payer: Self-pay

## 2019-10-16 VITALS — BP 140/90 | HR 105 | Temp 96.1°F | Ht 65.0 in | Wt 203.0 lb

## 2019-10-16 DIAGNOSIS — M545 Low back pain, unspecified: Secondary | ICD-10-CM

## 2019-10-16 MED ORDER — PREDNISONE 20 MG PO TABS: 40.0000 mg | ORAL_TABLET | Freq: Every day | ORAL | 0 refills | Status: AC

## 2019-10-16 MED ORDER — CYCLOBENZAPRINE HCL 10 MG PO TABS: 5.0000 mg | ORAL_TABLET | Freq: Three times a day (TID) | ORAL | 0 refills | Status: DC | PRN

## 2019-10-16 MED ORDER — TRAMADOL HCL 50 MG PO TABS: ORAL_TABLET | ORAL | 0 refills | Status: DC

## 2019-10-16 MED ORDER — METHYLPREDNISOLONE ACETATE 80 MG/ML IJ SUSP
80.0000 mg | Freq: Once | INTRAMUSCULAR | Status: AC
  Administered 2019-10-16: 80 mg via INTRAMUSCULAR

## 2019-10-16 NOTE — Patient Instructions (Addendum)
Heat (pad or rice pillow in microwave) over affected area, 10-15 minutes twice daily.   Ice/cold pack over area for 10-15 min twice daily.  Do not drink alcohol, do any illicit/street drugs, drive or do anything that requires alertness while on this medicine.   Start prednisone tomorrow. Hold the meloxicam while taking the prednisone.   EXERCISES  RANGE OF MOTION (ROM) AND STRETCHING EXERCISES - Low Back Pain Most people with lower back pain will find that their symptoms get worse with excessive bending forward (flexion) or arching at the lower back (extension). The exercises that will help resolve your symptoms will focus on the opposite motion.  If you have pain, numbness or tingling which travels down into your buttocks, leg or foot, the goal of the therapy is for these symptoms to move closer to your back and eventually resolve. Sometimes, these leg symptoms will get better, but your lower back pain may worsen. This is often an indication of progress in your rehabilitation. Be very alert to any changes in your symptoms and the activities in which you participated in the 24 hours prior to the change. Sharing this information with your caregiver will allow him or her to most efficiently treat your condition. These exercises may help you when beginning to rehabilitate your injury. Your symptoms may resolve with or without further involvement from your physician, physical therapist or athletic trainer. While completing these exercises, remember:   Restoring tissue flexibility helps normal motion to return to the joints. This allows healthier, less painful movement and activity.  An effective stretch should be held for at least 30 seconds.  A stretch should never be painful. You should only feel a gentle lengthening or release in the stretched tissue. FLEXION RANGE OF MOTION AND STRETCHING EXERCISES:  STRETCH - Flexion, Single Knee to Chest   Lie on a firm bed or floor with both legs extended  in front of you.  Keeping one leg in contact with the floor, bring your opposite knee to your chest. Hold your leg in place by either grabbing behind your thigh or at your knee.  Pull until you feel a gentle stretch in your low back. Hold 30 seconds.  Slowly release your grasp and repeat the exercise with the opposite side. Repeat 2 times. Complete this exercise 3 times per week.   STRETCH - Flexion, Double Knee to Chest  Lie on a firm bed or floor with both legs extended in front of you.  Keeping one leg in contact with the floor, bring your opposite knee to your chest.  Tense your stomach muscles to support your back and then lift your other knee to your chest. Hold your legs in place by either grabbing behind your thighs or at your knees.  Pull both knees toward your chest until you feel a gentle stretch in your low back. Hold 30 seconds.  Tense your stomach muscles and slowly return one leg at a time to the floor. Repeat 2 times. Complete this exercise 3 times per week.   STRETCH - Low Trunk Rotation  Lie on a firm bed or floor. Keeping your legs in front of you, bend your knees so they are both pointed toward the ceiling and your feet are flat on the floor.  Extend your arms out to the side. This will stabilize your upper body by keeping your shoulders in contact with the floor.  Gently and slowly drop both knees together to one side until you feel a gentle stretch  in your low back. Hold for 30 seconds.  Tense your stomach muscles to support your lower back as you bring your knees back to the starting position. Repeat the exercise to the other side. Repeat 2 times. Complete this exercise at least 3 times per week.   EXTENSION RANGE OF MOTION AND FLEXIBILITY EXERCISES:  STRETCH - Extension, Prone on Elbows   Lie on your stomach on the floor, a bed will be too soft. Place your palms about shoulder width apart and at the height of your head.  Place your elbows under your  shoulders. If this is too painful, stack pillows under your chest.  Allow your body to relax so that your hips drop lower and make contact more completely with the floor.  Hold this position for 30 seconds.  Slowly return to lying flat on the floor. Repeat 2 times. Complete this exercise 3 times per week.   RANGE OF MOTION - Extension, Prone Press Ups  Lie on your stomach on the floor, a bed will be too soft. Place your palms about shoulder width apart and at the height of your head.  Keeping your back as relaxed as possible, slowly straighten your elbows while keeping your hips on the floor. You may adjust the placement of your hands to maximize your comfort. As you gain motion, your hands will come more underneath your shoulders.  Hold this position 30 seconds.  Slowly return to lying flat on the floor. Repeat 2 times. Complete this exercise 3 times per week.   RANGE OF MOTION- Quadruped, Neutral Spine   Assume a hands and knees position on a firm surface. Keep your hands under your shoulders and your knees under your hips. You may place padding under your knees for comfort.  Drop your head and point your tailbone toward the ground below you. This will round out your lower back like an angry cat. Hold this position for 30 seconds.  Slowly lift your head and release your tail bone so that your back sags into a large arch, like an old horse.  Hold this position for 30 seconds.  Repeat this until you feel limber in your low back.  Now, find your "sweet spot." This will be the most comfortable position somewhere between the two previous positions. This is your neutral spine. Once you have found this position, tense your stomach muscles to support your low back.  Hold this position for 30 seconds. Repeat 2 times. Complete this exercise 3 times per week.   STRENGTHENING EXERCISES - Low Back Sprain These exercises may help you when beginning to rehabilitate your injury. These  exercises should be done near your "sweet spot." This is the neutral, low-back arch, somewhere between fully rounded and fully arched, that is your least painful position. When performed in this safe range of motion, these exercises can be used for people who have either a flexion or extension based injury. These exercises may resolve your symptoms with or without further involvement from your physician, physical therapist or athletic trainer. While completing these exercises, remember:   Muscles can gain both the endurance and the strength needed for everyday activities through controlled exercises.  Complete these exercises as instructed by your physician, physical therapist or athletic trainer. Increase the resistance and repetitions only as guided.  You may experience muscle soreness or fatigue, but the pain or discomfort you are trying to eliminate should never worsen during these exercises. If this pain does worsen, stop and make certain you  are following the directions exactly. If the pain is still present after adjustments, discontinue the exercise until you can discuss the trouble with your caregiver.  STRENGTHENING - Deep Abdominals, Pelvic Tilt   Lie on a firm bed or floor. Keeping your legs in front of you, bend your knees so they are both pointed toward the ceiling and your feet are flat on the floor.  Tense your lower abdominal muscles to press your low back into the floor. This motion will rotate your pelvis so that your tail bone is scooping upwards rather than pointing at your feet or into the floor. With a gentle tension and even breathing, hold this position for 3 seconds. Repeat 2 times. Complete this exercise 3 times per week.   STRENGTHENING - Abdominals, Crunches   Lie on a firm bed or floor. Keeping your legs in front of you, bend your knees so they are both pointed toward the ceiling and your feet are flat on the floor. Cross your arms over your chest.  Slightly tip your  chin down without bending your neck.  Tense your abdominals and slowly lift your trunk high enough to just clear your shoulder blades. Lifting higher can put excessive stress on the lower back and does not further strengthen your abdominal muscles.  Control your return to the starting position. Repeat 2 times. Complete this exercise 3 times per week.   STRENGTHENING - Quadruped, Opposite UE/LE Lift   Assume a hands and knees position on a firm surface. Keep your hands under your shoulders and your knees under your hips. You may place padding under your knees for comfort.  Find your neutral spine and gently tense your abdominal muscles so that you can maintain this position. Your shoulders and hips should form a rectangle that is parallel with the floor and is not twisted.  Keeping your trunk steady, lift your right hand no higher than your shoulder and then your left leg no higher than your hip. Make sure you are not holding your breath. Hold this position for 30 seconds.  Continuing to keep your abdominal muscles tense and your back steady, slowly return to your starting position. Repeat with the opposite arm and leg. Repeat 2 times. Complete this exercise 3 times per week.   STRENGTHENING - Abdominals and Quadriceps, Straight Leg Raise   Lie on a firm bed or floor with both legs extended in front of you.  Keeping one leg in contact with the floor, bend the other knee so that your foot can rest flat on the floor.  Find your neutral spine, and tense your abdominal muscles to maintain your spinal position throughout the exercise.  Slowly lift your straight leg off the floor about 6 inches for a count of 3, making sure to not hold your breath.  Still keeping your neutral spine, slowly lower your leg all the way to the floor. Repeat this exercise with each leg 2 times. Complete this exercise 3 times per week.  POSTURE AND BODY MECHANICS CONSIDERATIONS - Low Back Sprain Keeping correct  posture when sitting, standing or completing your activities will reduce the stress put on different body tissues, allowing injured tissues a chance to heal and limiting painful experiences. The following are general guidelines for improved posture.  While reading these guidelines, remember:  The exercises prescribed by your provider will help you have the flexibility and strength to maintain correct postures.  The correct posture provides the best environment for your joints to work. All  of your joints have less wear and tear when properly supported by a spine with good posture. This means you will experience a healthier, less painful body.  Correct posture must be practiced with all of your activities, especially prolonged sitting and standing. Correct posture is as important when doing repetitive low-stress activities (typing) as it is when doing a single heavy-load activity (lifting).  RESTING POSITIONS Consider which positions are most painful for you when choosing a resting position. If you have pain with flexion-based activities (sitting, bending, stooping, squatting), choose a position that allows you to rest in a less flexed posture. You would want to avoid curling into a fetal position on your side. If your pain worsens with extension-based activities (prolonged standing, working overhead), avoid resting in an extended position such as sleeping on your stomach. Most people will find more comfort when they rest with their spine in a more neutral position, neither too rounded nor too arched. Lying on a non-sagging bed on your side with a pillow between your knees, or on your back with a pillow under your knees will often provide some relief. Keep in mind, being in any one position for a prolonged period of time, no matter how correct your posture, can still lead to stiffness.  PROPER SITTING POSTURE In order to minimize stress and discomfort on your spine, you must sit with correct posture.  Sitting with good posture should be effortless for a healthy body. Returning to good posture is a gradual process. Many people can work toward this most comfortably by using various supports until they have the flexibility and strength to maintain this posture on their own. When sitting with proper posture, your ears will fall over your shoulders and your shoulders will fall over your hips. You should use the back of the chair to support your upper back. Your lower back will be in a neutral position, just slightly arched. You may place a small pillow or folded towel at the base of your lower back for  support.  When working at a desk, create an environment that supports good, upright posture. Without extra support, muscles tire, which leads to excessive strain on joints and other tissues. Keep these recommendations in mind:  CHAIR:  A chair should be able to slide under your desk when your back makes contact with the back of the chair. This allows you to work closely.  The chair's height should allow your eyes to be level with the upper part of your monitor and your hands to be slightly lower than your elbows.  BODY POSITION  Your feet should make contact with the floor. If this is not possible, use a foot rest.  Keep your ears over your shoulders. This will reduce stress on your neck and low back.  INCORRECT SITTING POSTURES  If you are feeling tired and unable to assume a healthy sitting posture, do not slouch or slump. This puts excessive strain on your back tissues, causing more damage and pain. Healthier options include:  Using more support, like a lumbar pillow.  Switching tasks to something that requires you to be upright or walking.  Talking a brief walk.  Lying down to rest in a neutral-spine position.  PROLONGED STANDING WHILE SLIGHTLY LEANING FORWARD  When completing a task that requires you to lean forward while standing in one place for a long time, place either foot up on a  stationary 2-4 inch high object to help maintain the best posture. When both feet are  on the ground, the lower back tends to lose its slight inward curve. If this curve flattens (or becomes too large), then the back and your other joints will experience too much stress, tire more quickly, and can cause pain.  CORRECT STANDING POSTURES Proper standing posture should be assumed with all daily activities, even if they only take a few moments, like when brushing your teeth. As in sitting, your ears should fall over your shoulders and your shoulders should fall over your hips. You should keep a slight tension in your abdominal muscles to brace your spine. Your tailbone should point down to the ground, not behind your body, resulting in an over-extended swayback posture.   INCORRECT STANDING POSTURES  Common incorrect standing postures include a forward head, locked knees and/or an excessive swayback. WALKING Walk with an upright posture. Your ears, shoulders and hips should all line-up.  PROLONGED ACTIVITY IN A FLEXED POSITION When completing a task that requires you to bend forward at your waist or lean over a low surface, try to find a way to stabilize 3 out of 4 of your limbs. You can place a hand or elbow on your thigh or rest a knee on the surface you are reaching across. This will provide you more stability, so that your muscles do not tire as quickly. By keeping your knees relaxed, or slightly bent, you will also reduce stress across your lower back. CORRECT LIFTING TECHNIQUES  DO :  Assume a wide stance. This will provide you more stability and the opportunity to get as close as possible to the object which you are lifting.  Tense your abdominals to brace your spine. Bend at the knees and hips. Keeping your back locked in a neutral-spine position, lift using your leg muscles. Lift with your legs, keeping your back straight.  Test the weight of unknown objects before attempting to lift  them.  Try to keep your elbows locked down at your sides in order get the best strength from your shoulders when carrying an object.     Always ask for help when lifting heavy or awkward objects. INCORRECT LIFTING TECHNIQUES DO NOT:   Lock your knees when lifting, even if it is a small object.  Bend and twist. Pivot at your feet or move your feet when needing to change directions.  Assume that you can safely pick up even a paperclip without proper posture.

## 2019-10-16 NOTE — Progress Notes (Signed)
Musculoskeletal Exam  Patient: Laurie Chang DOB: 1966/04/21  DOS: 10/16/2019  SUBJECTIVE:  Chief Complaint:   Chief Complaint  Patient presents with  . Back Pain    Laurie Chang is a 54 y.o.  female for evaluation and treatment of her back pain.   Onset:  3 days ago. 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   Location: lower Character:  sharp  Progression of issue:  is unchanged Associated symptoms: sometimes will get intermittently weakness; no bruising, redness,  Denies bowel/bladder incontinence or weakness Treatment: to date has been ice, prescription NSAIDS, acetaminophen and tramadol.   Neurovascular symptoms: no  Past Medical History:  Diagnosis Date  . Arthritis   . Asthma    as a child   . Hyperlipidemia   . Hypertension   . Leiomyoma uteri   . Smoker    1/2 PPD  . Syncope and collapse     Objective:  VITAL SIGNS: BP 140/90 (BP Location: Left Arm, Patient Position: Sitting, Cuff Size: Normal)   Pulse (!) 105   Temp (!) 96.1 F (35.6 C) (Temporal)   Ht 5\' 5"  (1.651 m)   Wt 203 lb (92.1 kg)   LMP 08/19/2010   SpO2 98%   BMI 33.78 kg/m  Constitutional: Well formed, well developed. No acute distress. HENT: Normocephalic, atraumatic.  Thorax & Lungs:  No accessory muscle use Skin: Warm. Dry. No erythema. No rash.  Musculoskeletal: low back.   Tenderness to palpation: yes; over lumbar parasp msc b/l Deformity: no Ecchymosis: no Straight leg test: negative for Poor hamstring flexibility b/l. Neurologic: Normal sensory function. No focal deficits noted. DTR's equal and symmetric in LE's. No clonus. Psychiatric: Normal mood. Age appropriate judgment and insight. Alert & oriented x 3.    Assessment:  Acute bilateral low back pain without sciatica - Plan: traMADol (ULTRAM) 50 MG tablet, predniSONE (DELTASONE) 20 MG tablet, cyclobenzaprine (FLEXERIL) 10 MG tablet, methylPREDNISolone acetate (DEPO-MEDROL) injection 80  mg  Plan: Stretches/exercises, heat, ice, Tylenol, depo inj today, pred burst tomorrow, tramadol for breakthrough pain. Does well with Flexeril w respect to drowsiness.  F/u prn. The patient voiced understanding and agreement to the plan.   [place], DO 10/16/19  10:19 AM

## 2019-11-10 ENCOUNTER — Other Ambulatory Visit: Payer: Self-pay

## 2019-11-10 DIAGNOSIS — M545 Low back pain, unspecified: Secondary | ICD-10-CM

## 2019-11-12 ENCOUNTER — Telehealth: Payer: Self-pay | Admitting: Medical

## 2019-11-12 MED ORDER — TRAMADOL HCL 50 MG PO TABS: ORAL_TABLET | ORAL | 0 refills | Status: DC

## 2019-11-12 NOTE — Telephone Encounter (Signed)
Will you let pt know I want her to have controlled medication visit before her next refill of tramadol. I did go ahead and send prescription to her pharmacy.

## 2019-11-13 ENCOUNTER — Other Ambulatory Visit: Payer: Self-pay | Admitting: Medical

## 2019-11-13 NOTE — Telephone Encounter (Signed)
Called patient and lvm to return call to schedule an appt.

## 2019-12-06 ENCOUNTER — Other Ambulatory Visit: Payer: Self-pay | Admitting: Medical

## 2019-12-06 ENCOUNTER — Other Ambulatory Visit: Payer: Self-pay

## 2019-12-06 DIAGNOSIS — M545 Low back pain, unspecified: Secondary | ICD-10-CM

## 2019-12-06 MED ORDER — CYCLOBENZAPRINE HCL 10 MG PO TABS: 5.0000 mg | ORAL_TABLET | Freq: Three times a day (TID) | ORAL | 0 refills | Status: DC | PRN

## 2019-12-06 MED ORDER — TRAMADOL HCL 50 MG PO TABS: ORAL_TABLET | ORAL | 0 refills | Status: DC

## 2019-12-06 NOTE — Telephone Encounter (Addendum)
Requesting: tramadol Contract:01/08/2019 UDS: 01/02/2019 Last Visit:10/16/19 Next Visit:04/02/20 Last Refill:11/12/19  Please Advise  rx refill tramadol sent to pt pharmacy.

## 2019-12-13 ENCOUNTER — Other Ambulatory Visit: Payer: Self-pay | Admitting: Medical

## 2019-12-27 ENCOUNTER — Other Ambulatory Visit: Payer: Self-pay | Admitting: Medical

## 2020-01-28 ENCOUNTER — Other Ambulatory Visit: Payer: Self-pay | Admitting: Medical

## 2020-01-28 DIAGNOSIS — Z1231 Encounter for screening mammogram for malignant neoplasm of breast: Secondary | ICD-10-CM

## 2020-02-17 ENCOUNTER — Other Ambulatory Visit: Payer: Self-pay | Admitting: Medical

## 2020-03-17 ENCOUNTER — Other Ambulatory Visit: Payer: Self-pay | Admitting: Medical

## 2020-03-17 DIAGNOSIS — M545 Low back pain, unspecified: Secondary | ICD-10-CM

## 2020-03-17 NOTE — Telephone Encounter (Signed)
For tramadol  Last written: 12/06/19 Last ov: 10/16/19 Next ov: 04/02/20 Contract: 01/06/20  UDS: 01/06/20

## 2020-03-20 MED ORDER — CYCLOBENZAPRINE HCL 10 MG PO TABS: 5.0000 mg | ORAL_TABLET | Freq: Three times a day (TID) | ORAL | 0 refills | Status: DC | PRN

## 2020-03-20 MED ORDER — TRAMADOL HCL 50 MG PO TABS: ORAL_TABLET | ORAL | 0 refills | Status: DC

## 2020-04-02 ENCOUNTER — Encounter: Payer: BLUE CROSS/BLUE SHIELD | Admitting: Medical

## 2020-04-02 ENCOUNTER — Ambulatory Visit (INDEPENDENT_AMBULATORY_CARE_PROVIDER_SITE_OTHER): Payer: BC Managed Care – PPO | Admitting: Medical

## 2020-04-02 ENCOUNTER — Ambulatory Visit
Admission: RE | Admit: 2020-04-02 | Discharge: 2020-04-02 | Disposition: A | Discharge status: Home / Self Care | Payer: BLUE CROSS/BLUE SHIELD | Source: Ambulatory Visit | Attending: Medical | Admitting: Medical

## 2020-04-02 ENCOUNTER — Other Ambulatory Visit: Payer: Self-pay

## 2020-04-02 ENCOUNTER — Encounter: Payer: Self-pay | Admitting: Medical

## 2020-04-02 VITALS — BP 150/90 | HR 88 | Temp 98.7°F | Ht 65.0 in | Wt 187.4 lb

## 2020-04-02 DIAGNOSIS — Z0000 Encounter for general adult medical examination without abnormal findings: Secondary | ICD-10-CM

## 2020-04-02 DIAGNOSIS — M545 Low back pain, unspecified: Secondary | ICD-10-CM

## 2020-04-02 DIAGNOSIS — L089 Local infection of the skin and subcutaneous tissue, unspecified: Secondary | ICD-10-CM

## 2020-04-02 DIAGNOSIS — H5203 Hypermetropia, bilateral: Secondary | ICD-10-CM | POA: Diagnosis not present

## 2020-04-02 DIAGNOSIS — M25561 Pain in right knee: Secondary | ICD-10-CM | POA: Diagnosis not present

## 2020-04-02 DIAGNOSIS — M25562 Pain in left knee: Secondary | ICD-10-CM

## 2020-04-02 DIAGNOSIS — Z1231 Encounter for screening mammogram for malignant neoplasm of breast: Secondary | ICD-10-CM | POA: Diagnosis not present

## 2020-04-02 DIAGNOSIS — I10 Essential (primary) hypertension: Secondary | ICD-10-CM

## 2020-04-02 DIAGNOSIS — Z23 Encounter for immunization: Secondary | ICD-10-CM | POA: Diagnosis not present

## 2020-04-02 DIAGNOSIS — G4700 Insomnia, unspecified: Secondary | ICD-10-CM

## 2020-04-02 DIAGNOSIS — L989 Disorder of the skin and subcutaneous tissue, unspecified: Secondary | ICD-10-CM

## 2020-04-02 LAB — COMPREHENSIVE METABOLIC PANEL
AG Ratio: 1.7 (calc) (ref 1.0–2.5)
ALT: 25 U/L (ref 6–29)
AST: 22 U/L (ref 10–35)
Albumin: 4.5 g/dL (ref 3.6–5.1)
Alkaline phosphatase (APISO): 58 U/L (ref 37–153)
BUN: 14 mg/dL (ref 7–25)
CO2: 25 mmol/L (ref 20–32)
Calcium: 9.9 mg/dL (ref 8.6–10.4)
Chloride: 107 mmol/L (ref 98–110)
Creat: 0.86 mg/dL (ref 0.50–1.05)
Globulin: 2.7 g/dL (calc) (ref 1.9–3.7)
Glucose, Bld: 92 mg/dL (ref 65–99)
Potassium: 4.3 mmol/L (ref 3.5–5.3)
Sodium: 138 mmol/L (ref 135–146)
Total Bilirubin: 0.6 mg/dL (ref 0.2–1.2)
Total Protein: 7.2 g/dL (ref 6.1–8.1)

## 2020-04-02 LAB — CBC WITH DIFFERENTIAL/PLATELET
Absolute Monocytes: 476 cells/uL (ref 200–950)
Basophils Absolute: 74 cells/uL (ref 0–200)
Basophils Relative: 0.9 %
Eosinophils Absolute: 148 cells/uL (ref 15–500)
Eosinophils Relative: 1.8 %
HCT: 45.9 % — ABNORMAL HIGH (ref 35.0–45.0)
Hemoglobin: 16.1 g/dL — ABNORMAL HIGH (ref 11.7–15.5)
Lymphs Abs: 2903 cells/uL (ref 850–3900)
MCH: 31.7 pg (ref 27.0–33.0)
MCHC: 35.1 g/dL (ref 32.0–36.0)
MCV: 90.4 fL (ref 80.0–100.0)
MPV: 9.7 fL (ref 7.5–12.5)
Monocytes Relative: 5.8 %
Neutro Abs: 4600 cells/uL (ref 1500–7800)
Neutrophils Relative %: 56.1 %
Platelets: 321 10*3/uL (ref 140–400)
RBC: 5.08 10*6/uL (ref 3.80–5.10)
RDW: 12.4 % (ref 11.0–15.0)
Total Lymphocyte: 35.4 %
WBC: 8.2 10*3/uL (ref 3.8–10.8)

## 2020-04-02 LAB — LIPID PANEL
Cholesterol: 139 mg/dL (ref <200)
HDL: 44 mg/dL — ABNORMAL LOW (ref >=50)
LDL Cholesterol (Calc): 77 mg/dL (calc)
Non-HDL Cholesterol (Calc): 95 mg/dL (calc) (ref <130)
Total CHOL/HDL Ratio: 3.2 (calc) (ref <5.0)
Triglycerides: 92 mg/dL (ref <150)

## 2020-04-02 MED ORDER — LOSARTAN POTASSIUM-HCTZ 100-12.5 MG PO TABS: 1.0000 | ORAL_TABLET | Freq: Every day | ORAL | 3 refills | Status: DC

## 2020-04-02 MED ORDER — DOXYCYCLINE HYCLATE 100 MG PO TABS: 100.0000 mg | ORAL_TABLET | Freq: Two times a day (BID) | ORAL | 0 refills | Status: DC

## 2020-04-02 NOTE — Progress Notes (Signed)
Subjective:    Patient ID: Laurie Chang, female    DOB: 1966/03/22, 54 y.o.   MRN: [id number]  HPI  Pt in for cpe/wellness exam.  Pt is fasting. Pt working at [employer]. Pt is eating better and loss 13 lbs. Pt is dong some light weights and sit ups at home. She is walking about 20,000 steps a day. Pt does smoking. Pt failed various cessation methods including wellbutrin. She is tapering off. 1 pack last her day and half. No alcohol use. Pt drinks 1.5 cups of coffee a day.  Pt got colonoscopy this year. Polyps found and told repeat in 3 years. Pt will get mammogram today. Hx of hysterectomy.   Pt has hx of knee pains and lower back pain. Pt is on meloxicam and used tramadol for break thru pain.   Pt also has hx of insomnia. Pt is sleeping well recently. She uses [place] infrequently.  She will update contract and give uds today.  Pt will get flu vaccine.   Pt states rt upper arm was itchy 2 days ago. Then yesterday she noticed red, warm and swollen area to rt upper arm. No fever, no chills or sweats.   Also pt states area on her upper back present and husband looked at area. Pt states new mole.   Review of Systems  Constitutional: Negative for chills, fatigue and fever.  Respiratory: Negative for cough, chest tightness, shortness of breath and wheezing.   Cardiovascular: Negative for chest pain and palpitations.  Gastrointestinal: Negative for abdominal pain.  Genitourinary: Negative for pelvic pain and urgency.  Musculoskeletal: Negative for back pain.  Skin:       Skin lesion  Neurological: Negative for dizziness, light-headedness and headaches.  Hematological: Negative for adenopathy.  Psychiatric/Behavioral: Negative for behavioral problems and confusion.       Objective:   Physical Exam   General Mental Status- Alert. General Appearance- Not in acute distress.     Neck Carotid Arteries- Normal color. Moisture- Normal Moisture. No carotid bruits. No  JVD.  Chest and Lung Exam Auscultation: Breath Sounds:-Normal.  Cardiovascular Auscultation:Rythm- Regular. Murmurs & Other Heart Sounds:Auscultation of the heart reveals- No Murmurs.  Abdomen Inspection:-Inspeection Normal. Palpation/Percussion:Note:No mass. Palpation and Percussion of the abdomen reveal- Non Tender, Non Distended + BS, no rebound or guarding.    Neurologic Cranial Nerve exam:- CN III-XII intact(No nystagmus), symmetric smile. Strength:- 5/5 equal and symmetric strength both upper and lower extremities.   Skin-rt upper arm/shoulder. 2.5 cm-3.0 cm circular raised red area, faint warm, faint tender and indurated. Also rt upper back circular raised skin lesion. Uniform in color.   Assessment & Plan:  For you wellness exam today I have ordered cbc, cmp and  lipid panel.  Vaccine given today flu vaccine.  Recommend exercise and healthy diet.  We will let you know lab results as they come in.  For bilateral chronic knee pain and low back pain, continue with meloxicam and sparingly intermittent use of tramadol.  We will update UDS and contract today.  For history of insomnia, continue with sparing use of Ambien.  UDS and contract updated as well.  For right upper arm skin infection secondary to possible insect bite, you can apply topical Benadryl twice daily and will prescribe doxycycline antibiotic.  Rx advisement given.  Your blood pressure is elevated today.  I am going to provide you with losartan/HCTZ 100/12.5 mg print prescription.  Check your blood pressure daily at home.  If your blood pressure  is above 140/90 then recommend an increase dose from your current losartan/HCTZ 50/12.5mg .  Do recommend that she continue to taper off of smoking.  Flu vaccine given today.  Referral to dermatology for skin lesion.  Follow-up in 7 to 10 days to make sure the right upper arm skin infection area resolved.  Sooner if the area worsens.  Mackie Pai, PA-C    [phone] charge as well.  Addressed chronic knee pain, chronic back pain, skin infection, insomnia, skin lesion and elevated BP.

## 2020-04-02 NOTE — Patient Instructions (Signed)
For you wellness exam today I have ordered cbc, cmp and  lipid panel.  Vaccine given today flu vaccine.  Recommend exercise and healthy diet.  We will let you know lab results as they come in.  For bilateral chronic knee pain and low back pain, continue with meloxicam and sparingly intermittent use of tramadol.  We will update UDS and contract today.  For history of insomnia, continue with sparing use of Ambien.  UDS and contract updated as well.  For right upper arm skin infection secondary to possible insect bite, you can apply topical Benadryl twice daily and will prescribe doxycycline antibiotic.  Rx advisement given.  Your blood pressure is elevated today.  I am going to provide you with losartan/HCTZ 100/12.5 mg print prescription.  Check your blood pressure daily at home.  If your blood pressure is above 140/90 then recommend an increase dose from your current losartan/HCTZ 50/12.5mg .  Do recommend that she continue to taper off of smoking.  Flu vaccine given today.  Referral to dermatology for skin lesion.  Follow-up in 7 to 10 days to make sure the right upper arm skin infection area resolved.  Sooner if the area worsens.

## 2020-04-03 LAB — DRUG MONITORING, PANEL 8 WITH CONFIRMATION, URINE
6 Acetylmorphine: NEGATIVE ng/mL (ref <10)
Alcohol Metabolites: NEGATIVE ng/mL
Amphetamines: NEGATIVE ng/mL (ref <500)
Benzodiazepines: NEGATIVE ng/mL (ref <100)
Buprenorphine, Urine: NEGATIVE ng/mL (ref <5)
Cocaine Metabolite: NEGATIVE ng/mL (ref <150)
Creatinine: 27 mg/dL
MDMA: NEGATIVE ng/mL (ref <500)
Marijuana Metabolite: NEGATIVE ng/mL (ref <20)
Opiates: NEGATIVE ng/mL (ref <100)
Oxidant: NEGATIVE ug/mL
Oxycodone: NEGATIVE ng/mL (ref <100)
pH: 7.1 (ref 4.5–9.0)

## 2020-04-03 LAB — DM TEMPLATE

## 2020-05-15 ENCOUNTER — Other Ambulatory Visit: Payer: Self-pay | Admitting: Medical

## 2020-05-21 ENCOUNTER — Other Ambulatory Visit: Payer: Self-pay | Admitting: Medical

## 2020-05-21 DIAGNOSIS — M545 Low back pain, unspecified: Secondary | ICD-10-CM

## 2020-05-21 NOTE — Telephone Encounter (Signed)
Last written: 03/20/20     Last ov: 04/02/20 Next ov: 04/03/21 Contract: 04/02/20 UDS: 04/02/20

## 2020-05-22 MED ORDER — TRAMADOL HCL 50 MG PO TABS: ORAL_TABLET | ORAL | 0 refills | Status: DC

## 2020-05-22 NOTE — Telephone Encounter (Signed)
Rx tramadol sent to pt pharmacy. 

## 2020-06-14 ENCOUNTER — Other Ambulatory Visit: Payer: Self-pay | Admitting: Medical

## 2020-06-21 ENCOUNTER — Other Ambulatory Visit: Payer: Self-pay | Admitting: Medical

## 2020-06-25 ENCOUNTER — Other Ambulatory Visit: Payer: Self-pay | Admitting: Medical

## 2020-06-25 DIAGNOSIS — M545 Low back pain, unspecified: Secondary | ICD-10-CM

## 2020-06-26 MED ORDER — CYCLOBENZAPRINE HCL 10 MG PO TABS: 5.0000 mg | ORAL_TABLET | Freq: Three times a day (TID) | ORAL | 0 refills | Status: DC | PRN

## 2020-06-26 NOTE — Telephone Encounter (Addendum)
Requesting: tramadol 50mg  Contract: 04/02/2020 UDS: 04/02/2020 Last Visit: 04/02/2020 Next Visit: 04/03/2021 Last Refill: 05/22/2020 #15 and 0RF Pt sig: 1-2 tab q8h prn  Please Advise  Refiil tramadol sent to pharmacy.

## 2020-06-27 MED ORDER — TRAMADOL HCL 50 MG PO TABS: ORAL_TABLET | ORAL | 0 refills | Status: DC

## 2020-08-15 ENCOUNTER — Other Ambulatory Visit: Payer: Self-pay | Admitting: Medical

## 2020-08-21 ENCOUNTER — Other Ambulatory Visit: Payer: Self-pay | Admitting: Medical

## 2020-08-21 DIAGNOSIS — M545 Low back pain, unspecified: Secondary | ICD-10-CM

## 2020-08-22 MED ORDER — CYCLOBENZAPRINE HCL 10 MG PO TABS: 5.0000 mg | ORAL_TABLET | Freq: Three times a day (TID) | ORAL | 0 refills | Status: DC | PRN

## 2020-08-22 NOTE — Telephone Encounter (Signed)
Last OV--04/02/2020 Next Scheduled OV--04/03/2021 Last refill on 06/27/2020 #15 no refills

## 2020-08-25 MED ORDER — TRAMADOL HCL 50 MG PO TABS: ORAL_TABLET | ORAL | 0 refills | Status: DC

## 2020-10-21 ENCOUNTER — Other Ambulatory Visit: Payer: Self-pay | Admitting: Medical

## 2020-10-21 DIAGNOSIS — M545 Low back pain, unspecified: Secondary | ICD-10-CM

## 2020-10-22 MED ORDER — CYCLOBENZAPRINE HCL 10 MG PO TABS: 5.0000 mg | ORAL_TABLET | Freq: Three times a day (TID) | ORAL | 1 refills | Status: DC | PRN

## 2020-10-22 MED ORDER — TRAMADOL HCL 50 MG PO TABS: ORAL_TABLET | ORAL | 0 refills | Status: DC

## 2020-10-22 NOTE — Telephone Encounter (Addendum)
Requesting: tramadol 50mg  Contract: 04/02/2020 UDS: 04/02/2020 Last Visit: 04/02/2020 Next Visit: 04/03/21 Last Refill: 08/25/2020 #15 and 0RF  Please Advise  Refilled med.   Please get pt to follow up next month for controlled med visit.

## 2020-11-18 ENCOUNTER — Other Ambulatory Visit: Payer: Self-pay | Admitting: Medical

## 2020-12-15 ENCOUNTER — Other Ambulatory Visit: Payer: Self-pay | Admitting: Medical

## 2021-01-10 DIAGNOSIS — Z20822 Contact with and (suspected) exposure to covid-19: Secondary | ICD-10-CM | POA: Diagnosis not present

## 2021-01-12 ENCOUNTER — Encounter: Payer: Self-pay | Admitting: Medical

## 2021-02-13 ENCOUNTER — Other Ambulatory Visit: Payer: Self-pay | Admitting: Medical

## 2021-02-20 IMAGING — MG DIGITAL SCREENING BILAT W/ TOMO W/ CAD
8 series · 8 of 24 positions shown · non-contrast
Comparison: Previous exam(s).

CLINICAL DATA: Screening.

EXAM:
DIGITAL SCREENING BILATERAL MAMMOGRAM WITH TOMO AND CAD

[L CC synth-2D]
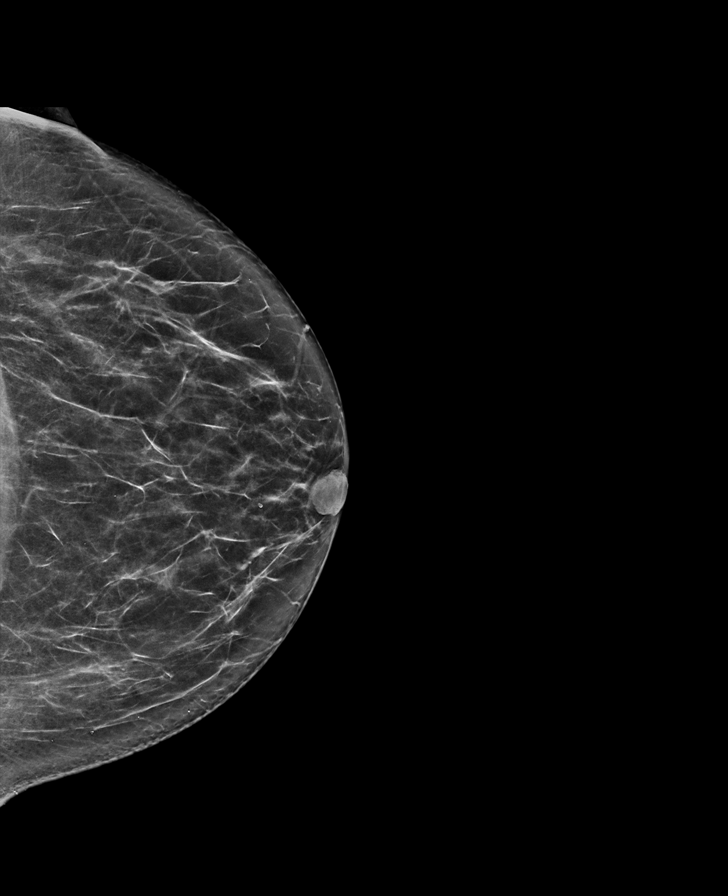

[R MLO synth-2D]
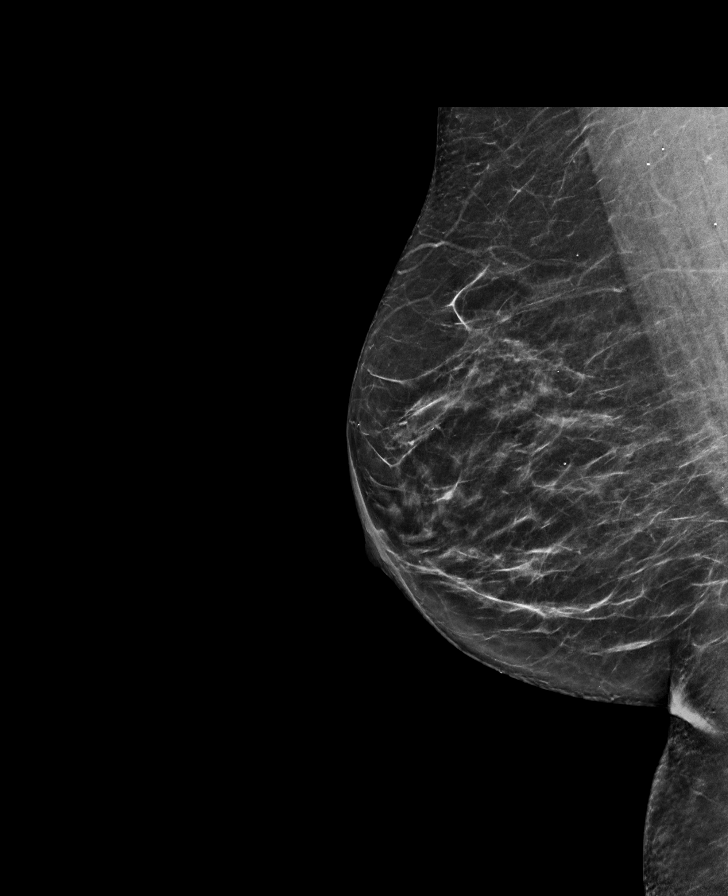

[R CC synth-2D]
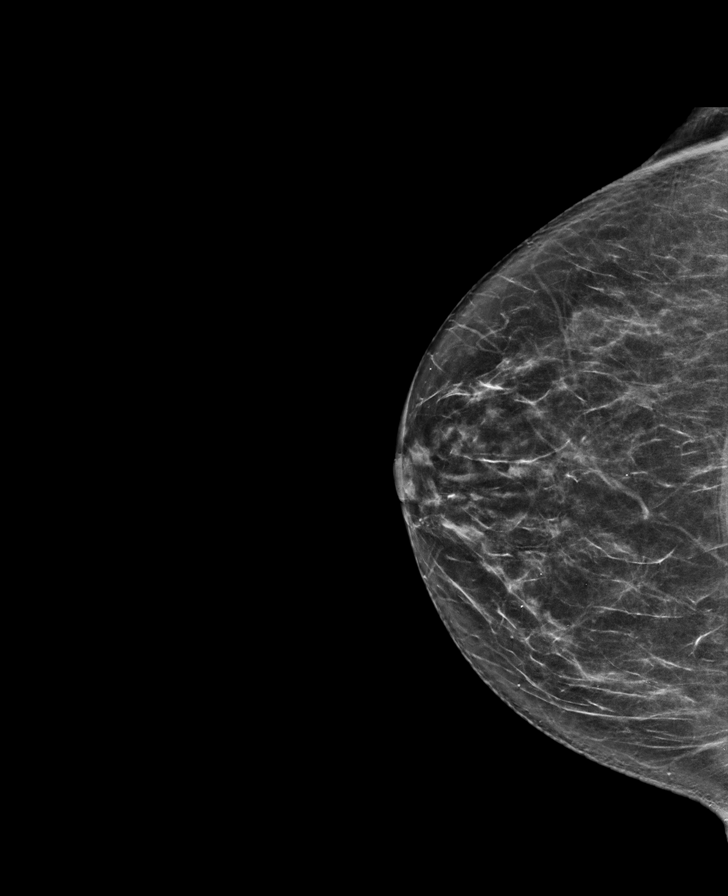

[L MLO synth-2D]
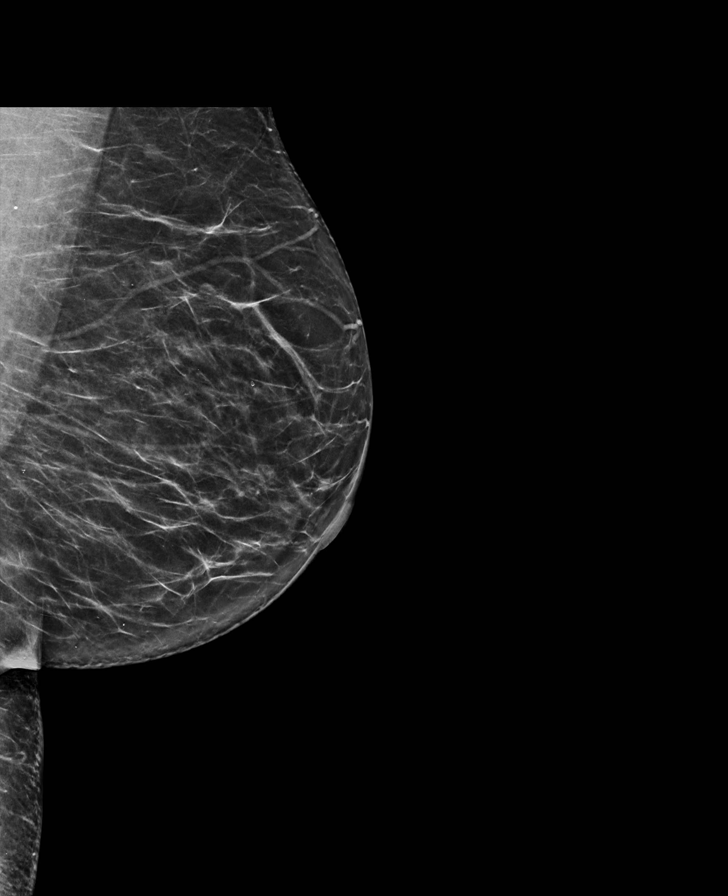

[R CC tomo · tomo slice 35/69.0]
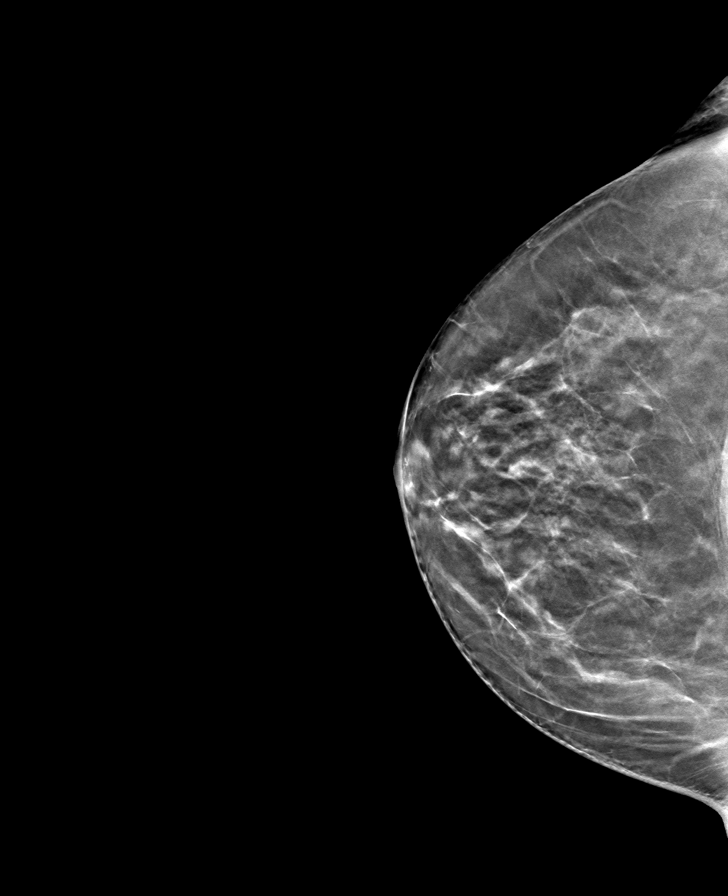

[R MLO tomo · tomo slice 35/70.0]
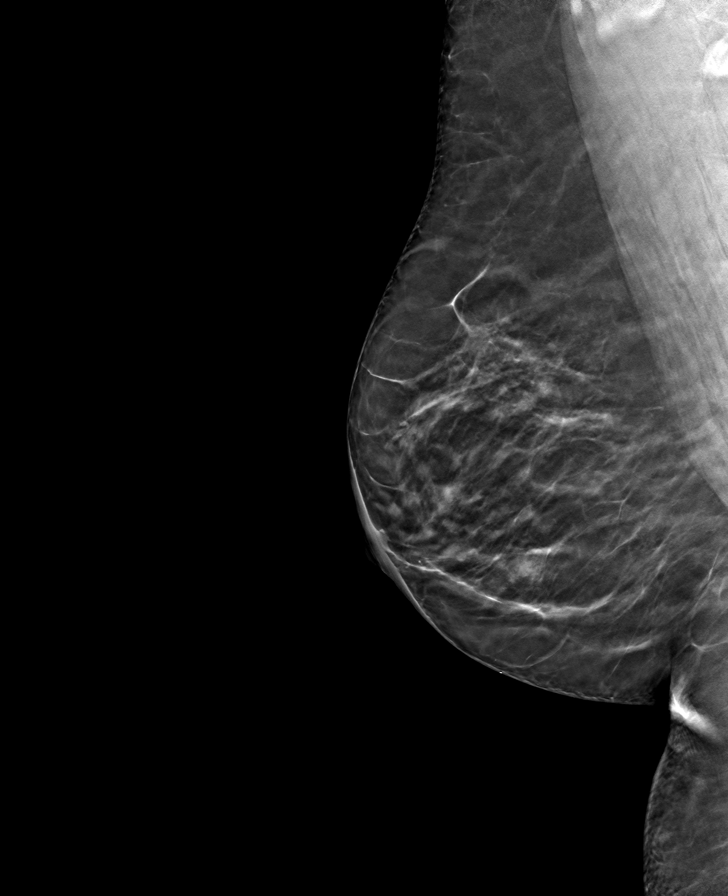

[L MLO tomo · tomo slice 35/68.0]
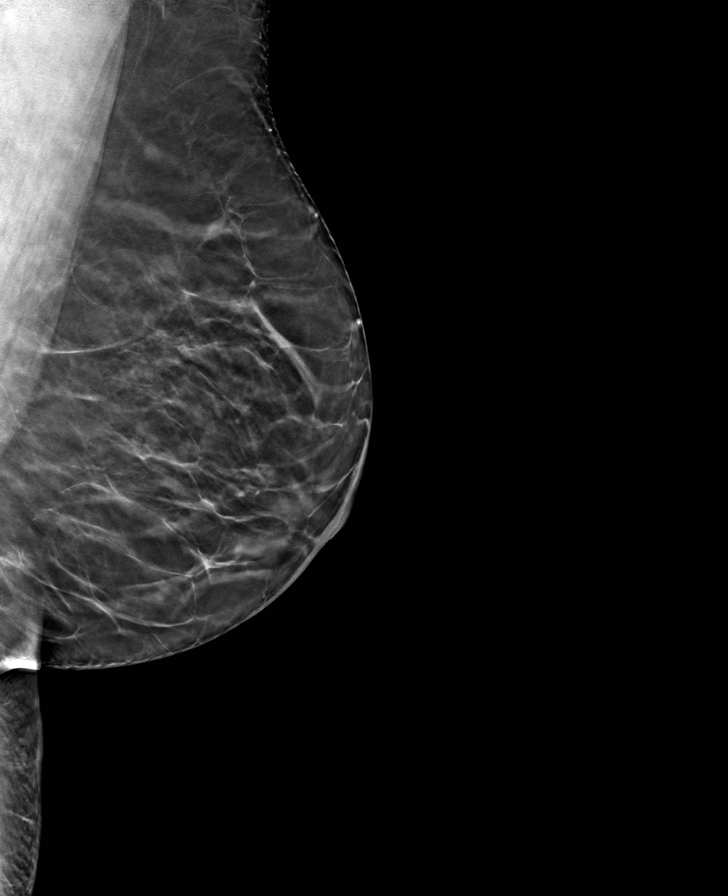

[L CC tomo · tomo slice 36/71.0]
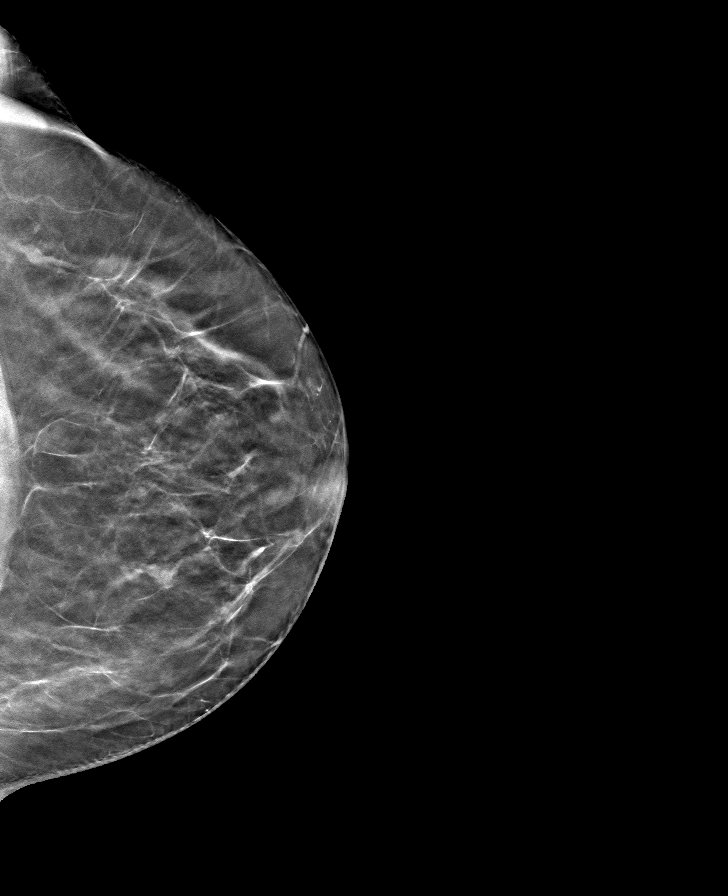

[8 of 24 positions shown; findings below may reference images not displayed]

ACR Breast Density Category b: There are scattered areas of
fibroglandular density.
FINDINGS: There are no findings suspicious for malignancy. Images were
processed with CAD.
IMPRESSION: No mammographic evidence of malignancy. A result letter of this
screening mammogram will be mailed directly to the patient.

RECOMMENDATION:
Screening mammogram in one year. (Code:[id number])

BI-RADS CATEGORY  1: Negative.

## 2021-03-15 ENCOUNTER — Other Ambulatory Visit: Payer: Self-pay | Admitting: Medical

## 2021-03-15 DIAGNOSIS — M545 Low back pain, unspecified: Secondary | ICD-10-CM

## 2021-03-16 MED ORDER — TRAMADOL HCL 50 MG PO TABS: ORAL_TABLET | ORAL | 0 refills | Status: DC

## 2021-03-16 NOTE — Telephone Encounter (Signed)
Requesting: tramadol Contract:  UDS: 04/02/20 Last Visit: 04/02/20 Next Visit: 04/03/21 Last Refill: 10/22/20  Please Advise

## 2021-03-26 ENCOUNTER — Other Ambulatory Visit: Payer: Self-pay | Admitting: Medical

## 2021-03-26 DIAGNOSIS — Z1231 Encounter for screening mammogram for malignant neoplasm of breast: Secondary | ICD-10-CM

## 2021-04-03 ENCOUNTER — Other Ambulatory Visit: Payer: Self-pay

## 2021-04-03 ENCOUNTER — Encounter: Payer: Self-pay | Admitting: Medical

## 2021-04-03 ENCOUNTER — Ambulatory Visit (INDEPENDENT_AMBULATORY_CARE_PROVIDER_SITE_OTHER): Payer: BC Managed Care – PPO | Admitting: Medical

## 2021-04-03 VITALS — BP 134/73 | HR 78 | Temp 98.1°F | Resp 18 | Ht 65.0 in | Wt 183.0 lb

## 2021-04-03 DIAGNOSIS — Z0000 Encounter for general adult medical examination without abnormal findings: Secondary | ICD-10-CM

## 2021-04-03 DIAGNOSIS — F17290 Nicotine dependence, other tobacco product, uncomplicated: Secondary | ICD-10-CM | POA: Diagnosis not present

## 2021-04-03 DIAGNOSIS — M5440 Lumbago with sciatica, unspecified side: Secondary | ICD-10-CM | POA: Diagnosis not present

## 2021-04-03 DIAGNOSIS — Z23 Encounter for immunization: Secondary | ICD-10-CM

## 2021-04-03 DIAGNOSIS — M2550 Pain in unspecified joint: Secondary | ICD-10-CM

## 2021-04-03 DIAGNOSIS — Z7185 Encounter for immunization safety counseling: Secondary | ICD-10-CM | POA: Diagnosis not present

## 2021-04-03 DIAGNOSIS — Z79899 Other long term (current) drug therapy: Secondary | ICD-10-CM

## 2021-04-03 DIAGNOSIS — G8929 Other chronic pain: Secondary | ICD-10-CM

## 2021-04-03 LAB — CBC WITH DIFFERENTIAL/PLATELET
Basophils Absolute: 0 10*3/uL (ref 0.0–0.1)
Basophils Relative: 0.6 % (ref 0.0–3.0)
Eosinophils Absolute: 0.2 10*3/uL (ref 0.0–0.7)
Eosinophils Relative: 2.2 % (ref 0.0–5.0)
HCT: 43.3 % (ref 36.0–46.0)
Hemoglobin: 14.5 g/dL (ref 12.0–15.0)
Lymphocytes Relative: 40.4 % (ref 12.0–46.0)
Lymphs Abs: 2.9 10*3/uL (ref 0.7–4.0)
MCHC: 33.5 g/dL (ref 30.0–36.0)
MCV: 93.2 fl (ref 78.0–100.0)
Monocytes Absolute: 0.4 10*3/uL (ref 0.1–1.0)
Monocytes Relative: 5.7 % (ref 3.0–12.0)
Neutro Abs: 3.7 10*3/uL (ref 1.4–7.7)
Neutrophils Relative %: 51.1 % (ref 43.0–77.0)
Platelets: 300 10*3/uL (ref 150.0–400.0)
RBC: 4.65 Mil/uL (ref 3.87–5.11)
RDW: 13.2 % (ref 11.5–15.5)
WBC: 7.3 10*3/uL (ref 4.0–10.5)

## 2021-04-03 LAB — COMPREHENSIVE METABOLIC PANEL
ALT: 20 U/L (ref 0–35)
AST: 19 U/L (ref 0–37)
Albumin: 4.2 g/dL (ref 3.5–5.2)
Alkaline Phosphatase: 64 U/L (ref 39–117)
BUN: 17 mg/dL (ref 6–23)
CO2: 27 mEq/L (ref 19–32)
Calcium: 9.7 mg/dL (ref 8.4–10.5)
Chloride: 103 mEq/L (ref 96–112)
Creatinine, Ser: 0.76 mg/dL (ref 0.40–1.20)
GFR: 87.12 mL/min (ref >60.00)
Glucose, Bld: 91 mg/dL (ref 70–99)
Potassium: 4.3 mEq/L (ref 3.5–5.1)
Sodium: 139 mEq/L (ref 135–145)
Total Bilirubin: 0.7 mg/dL (ref 0.2–1.2)
Total Protein: 6.9 g/dL (ref 6.0–8.3)

## 2021-04-03 LAB — LIPID PANEL
Cholesterol: 130 mg/dL (ref 0–200)
HDL: 44.4 mg/dL (ref >39.00)
LDL Cholesterol: 69 mg/dL (ref 0–99)
NonHDL: 86.08
Total CHOL/HDL Ratio: 3
Triglycerides: 86 mg/dL (ref 0.0–149.0)
VLDL: 17.2 mg/dL (ref 0.0–40.0)

## 2021-04-03 LAB — C-REACTIVE PROTEIN: CRP: <1 mg/dL (ref 0.5–20.0)

## 2021-04-03 LAB — SEDIMENTATION RATE: Sed Rate: 12 mm/hr (ref 0–30)

## 2021-04-03 MED ORDER — VARENICLINE TARTRATE 0.5 MG X 11 & 1 MG X 42 PO MISC: ORAL | 0 refills | Status: DC

## 2021-04-03 MED ORDER — TRAMADOL HCL 50 MG PO TABS: 50.0000 mg | ORAL_TABLET | Freq: Four times a day (QID) | ORAL | 0 refills | Status: AC | PRN

## 2021-04-03 NOTE — Patient Instructions (Addendum)
For you wellness exam today I have ordered cbc, cmp and lipid panel.  Vaccine given today flu vaccine. Also pcv 20 vaccine.  Recommend exercise and healthy diet.  We will let you know lab results as they come in.  Follow up date appointment will be determined after lab review.    For smoking cessation rx chantix. Rx advisement given.  For back pain and arthralgia continue meloxicam, occasional flexeril and refill tramadol. Will update controlled contract and uds. Still try to use tramadol sparingly. Getting inflmammatory labs.    Preventive Care 57-57 Years Old, Female Preventive care refers to lifestyle choices and visits with your health care provider that can promote health and wellness. This includes: A yearly physical exam. This is also called an annual wellness visit. Regular dental and eye exams. Immunizations. Screening for certain conditions. Healthy lifestyle choices, such as: Eating a healthy diet. Getting regular exercise. Not using drugs or products that contain nicotine and tobacco. Limiting alcohol use. What can I expect for my preventive care visit? Physical exam Your health care provider will check your: Height and weight. These may be used to calculate your BMI (body mass index). BMI is a measurement that tells if you are at a healthy weight. Heart rate and blood pressure. Body temperature. Skin for abnormal spots. Counseling Your health care provider may ask you questions about your: Past medical problems. Family's medical history. Alcohol, tobacco, and drug use. Emotional well-being. Home life and relationship well-being. Sexual activity. Diet, exercise, and sleep habits. Work and work Statistician. Access to firearms. Method of birth control. Menstrual cycle. Pregnancy history. What immunizations do I need? Vaccines are usually given at various ages, according to a schedule. Your health care provider will recommend vaccines for you based on your  age, medical history, and lifestyle or other factors, such as travel or where you work. What tests do I need? Blood tests Lipid and cholesterol levels. These may be checked every 5 years, or more often if you are over 57 years old. Hepatitis C test. Hepatitis B test. Screening Lung cancer screening. You may have this screening every year starting at age 57 if you have a 30-pack-year history of smoking and currently smoke or have quit within the past 15 years. Colorectal cancer screening. All adults should have this screening starting at age 57 and continuing until age 32. Your health care provider may recommend screening at age 41 if you are at increased risk. You will have tests every 1-10 years, depending on your results and the type of screening test. Diabetes screening. This is done by checking your blood sugar (glucose) after you have not eaten for a while (fasting). You may have this done every 1-3 years. Mammogram. This may be done every 1-2 years. Talk with your health care provider about when you should start having regular mammograms. This may depend on whether you have a family history of breast cancer. BRCA-related cancer screening. This may be done if you have a family history of breast, ovarian, tubal, or peritoneal cancers. Pelvic exam and Pap test. This may be done every 3 years starting at age 57. Starting at age 57, this may be done every 5 years if you have a Pap test in combination with an HPV test. Other tests STD (sexually transmitted disease) testing, if you are at risk. Bone density scan. This is done to screen for osteoporosis. You may have this scan if you are at high risk for osteoporosis. Talk with your health care  provider about your test results, treatment options, and if necessary, the need for more tests. Follow these instructions at home: Eating and drinking  Eat a diet that includes fresh fruits and vegetables, whole grains, lean protein, and low-fat  dairy products. Take vitamin and mineral supplements as recommended by your health care provider. Do not drink alcohol if: Your health care provider tells you not to drink. You are pregnant, may be pregnant, or are planning to become pregnant. If you drink alcohol: Limit how much you have to 0-1 drink a day. Be aware of how much alcohol is in your drink. In the [place] one drink equals one 12 oz bottle of beer (355 mL), one 5 oz glass of wine (148 mL), or one 1 oz glass of hard liquor (44 mL). Lifestyle Take daily care of your teeth and gums. Brush your teeth every morning and night with fluoride toothpaste. Floss one time each day. Stay active. Exercise for at least 30 minutes 5 or more days each week. Do not use any products that contain nicotine or tobacco, such as cigarettes, e-cigarettes, and chewing tobacco. If you need help quitting, ask your health care provider. Do not use drugs. If you are sexually active, practice safe sex. Use a condom or other form of protection to prevent STIs (sexually transmitted infections). If you do not wish to become pregnant, use a form of birth control. If you plan to become pregnant, see your health care provider for a prepregnancy visit. If told by your health care provider, take low-dose aspirin daily starting at age 57. Find healthy ways to cope with stress, such as: Meditation, yoga, or listening to music. Journaling. Talking to a trusted person. Spending time with friends and family. Safety Always wear your seat belt while driving or riding in a vehicle. Do not drive: If you have been drinking alcohol. Do not ride with someone who has been drinking. When you are tired or distracted. While texting. Wear a helmet and other protective equipment during sports activities. If you have firearms in your house, make sure you follow all gun safety procedures. What's next? Visit your health care provider once a year for an annual wellness visit. Ask  your health care provider how often you should have your eyes and teeth checked. Stay up to date on all vaccines. This information is not intended to replace advice given to you by your health care provider. Make sure you discuss any questions you have with your health care provider. Document Revised: 09/12/2020 Document Reviewed: 03/16/2018 Elsevier Patient Education  2022 [employer].

## 2021-04-03 NOTE — Addendum Note (Signed)
Addended by: Jeronimo Greaves on: 04/03/2021 08:50 AM   Modules accepted: Orders

## 2021-04-03 NOTE — Progress Notes (Signed)
Subjective:    Patient ID: Laurie Chang, female    DOB: 19-Jun-1966, 55 y.o.   MRN: [id number]  HPI  Pt is fasting. Pt working at [employer]. Pt is eating better and had purposeful wt loss. Pt is dong some light weights and sit ups at home. She is walking about 20,000 steps a day. Pt does smoking. Pt failed various cessation methods including wellbutrin.  Pt remembers tyring chantix with no major side effects. She is tapering off. 1 pack last her day and half. No alcohol use. Pt drinks 1.5 cups of coffee a day.   Pt got colonoscopy this year. Polyps found and told repeat in 3 years. Pt will get mammogram in october. Hx of hysterectomy.   Hx of various areas of pain back, knees, feet and fingers. 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  Pt in past uses meloxicam, flexeril and tramadol. Pt states using tramadol now about one every other day. Previously was using much less.   Review of Systems  Constitutional:  Negative for chills, fatigue and fever.  Respiratory:  Negative for cough, chest tightness, shortness of breath and wheezing.   Cardiovascular:  Negative for chest pain and palpitations.  Gastrointestinal:  Negative for abdominal pain.  Genitourinary:  Negative for pelvic pain and urgency.  Musculoskeletal:  Negative for back pain.  Neurological:  Negative for dizziness, light-headedness and headaches.  Hematological:  Negative for adenopathy.  Psychiatric/Behavioral:  Negative for behavioral problems and confusion.     Past Medical History:  Diagnosis Date   Arthritis    Asthma    as a child    Hyperlipidemia    Hypertension    Leiomyoma uteri    Smoker    1/2 PPD   Syncope and collapse      Social History   Socioeconomic History   Marital status: Married    Spouse name: Not on file   Number of children: 3   Years of education: 12   Highest education level: Not on file  Occupational History   Occupation: Med Engineer, production: [place]  Tobacco Use   Smoking status: Every Day    Packs/day: 0.50    Years: 28.00    Pack years: 14.00    Types: Cigarettes   Smokeless tobacco: Never  Vaping Use   Vaping Use: Never used  Substance and Sexual Activity   Alcohol use: Not Currently    Comment: once in a blue moon   Drug use: No   Sexual activity: Yes    Partners: Male    Birth control/protection: Surgical  Other Topics Concern   Not on file  Social History Narrative   Widowed in 2012.  Lives with her daughter and two granddaughters.   Social Determinants of Health   Financial Resource Strain: Not on file  Food Insecurity: Not on file  Transportation Needs: Not on file  Physical Activity: Not on file  Stress: Not on file  Social Connections: Not on file  Intimate Partner Violence: Not on file    Past Surgical History:  Procedure Laterality Date   ABDOMINAL HYSTERECTOMY  12/11/10   TAH,RSO   HYSTEROSCOPY  11/2000   HYSTEROSCOPIC MYOMECTOMY   LAPAROSCOPIC CHOLECYSTECTOMY     OOPHORECTOMY  12/11/10   TAH,RSO   TUBAL LIGATION     VEIN SURGERY      Family History  Problem Relation Age of Onset  Hypertension Father    Heart disease Father    Hypertension Brother    Cancer Mother        LUNG   Cancer Maternal Grandmother 76       breast   Breast cancer Maternal Grandmother    Cancer Maternal Aunt 60       breast   Colon cancer Neg Hx    Colon polyps Neg Hx    Esophageal cancer Neg Hx    Stomach cancer Neg Hx    Rectal cancer Neg Hx     No Known Allergies  Current Outpatient Medications on File Prior to Visit  Medication Sig Dispense Refill   aspirin 81 MG tablet Take 81 mg by mouth daily.     atorvastatin (LIPITOR) 10 MG tablet Take 1 tablet (10 mg total) by mouth daily. 90 tablet 3   cyclobenzaprine (FLEXERIL) 10 MG tablet Take 0.5-1 tablets (5-10 mg total) by mouth 3 (three) times daily as needed for muscle spasms. 21 tablet 1    losartan-hydrochlorothiazide (HYZAAR) 100-12.5 MG tablet Take 1 tablet by mouth daily. 30 tablet 3   losartan-hydrochlorothiazide (HYZAAR) 50-12.5 MG tablet TAKE 1 TABLET BY MOUTH EVERY DAY 90 tablet 1   meloxicam (MOBIC) 7.5 MG tablet TAKE 1 TABLET BY MOUTH EVERY DAY 30 tablet 2   Multiple Vitamins-Minerals (ONE-A-DAY WOMENS PO) Take by mouth.     traMADol (ULTRAM) 50 MG tablet 1-2 tabs every 8 hours as needed for pain 5 tablet 0   zolpidem (AMBIEN) 5 MG tablet Take 1 tablet (5 mg total) by mouth at bedtime as needed for sleep. 30 tablet 1   No current facility-administered medications on file prior to visit.    BP 134/73   Pulse 78   Temp 98.1 F (36.7 C)   Resp 18   Ht '5\' 5"'$  (1.651 m)   Wt 183 lb (83 kg)   LMP 08/19/2010   SpO2 97%   BMI 30.45 kg/m       Objective:   Physical Exam  General Mental Status- Alert. General Appearance- Not in acute distress.   Skin General: Color- Normal Color. Moisture- Normal Moisture. On inspection no worrisome moles or lesions.  Neck Carotid Arteries- Normal color. Moisture- Normal Moisture. No carotid bruits. No JVD.  Chest and Lung Exam Auscultation: Breath Sounds:-Normal.  Cardiovascular Auscultation:Rythm- Regular. Murmurs & Other Heart Sounds:Auscultation of the heart reveals- No Murmurs.  Abdomen Inspection:-Inspeection Normal. Palpation/Percussion:Note:No mass. Palpation and Percussion of the abdomen reveal- Non Tender, Non Distended + BS, no rebound or guarding.   Neurologic Cranial Nerve exam:- CN III-XII intact(No nystagmus), symmetric smile. Strength:- 5/5 equal and symmetric strength both upper and lower extremities.       Assessment & Plan:   For you wellness exam today I have ordered cbc, cmp and lipid panel.  Vaccine given today flu vaccine. Also pcv 20 vaccine.  Recommend exercise and healthy diet.  We will let you know lab results as they come in.  Follow up date appointment will be determined after  lab review.    For smoking cessation rx chantix. Rx advisement given.  For back pain and arthralgia continue meloxicam, occasional flexeril and refill tramadol.   [id number] as did address smoking cessation and chronic back pain along with arthralgias. Controlled med visit.  Mackie Pai, PA-C

## 2021-04-06 LAB — ANA: Anti Nuclear Antibody (ANA): NEGATIVE

## 2021-04-06 LAB — RHEUMATOID FACTOR: Rheumatoid fact SerPl-aCnc: <14 IU/mL (ref <14)

## 2021-04-07 LAB — DRUG MONITORING PANEL 376104, URINE
Amphetamines: NEGATIVE ng/mL (ref <500)
Barbiturates: NEGATIVE ng/mL (ref <300)
Benzodiazepines: NEGATIVE ng/mL (ref <100)
Cocaine Metabolite: NEGATIVE ng/mL (ref <150)
Desmethyltramadol: 467 ng/mL — ABNORMAL HIGH (ref <100)
Opiates: NEGATIVE ng/mL (ref <100)
Oxycodone: NEGATIVE ng/mL (ref <100)
Tramadol: 412 ng/mL — ABNORMAL HIGH (ref <100)

## 2021-04-07 LAB — DM TEMPLATE

## 2021-04-28 ENCOUNTER — Ambulatory Visit
Admission: RE | Admit: 2021-04-28 | Discharge: 2021-04-28 | Disposition: A | Discharge status: Home / Self Care | Payer: BC Managed Care – PPO | Source: Ambulatory Visit | Attending: Medical | Admitting: Medical

## 2021-04-28 ENCOUNTER — Other Ambulatory Visit: Payer: Self-pay

## 2021-04-28 DIAGNOSIS — Z1231 Encounter for screening mammogram for malignant neoplasm of breast: Secondary | ICD-10-CM | POA: Diagnosis not present

## 2021-05-06 DIAGNOSIS — H53032 Strabismic amblyopia, left eye: Secondary | ICD-10-CM | POA: Diagnosis not present

## 2021-05-21 ENCOUNTER — Other Ambulatory Visit: Payer: Self-pay | Admitting: Medical

## 2021-06-12 ENCOUNTER — Other Ambulatory Visit: Payer: Self-pay | Admitting: Medical

## 2021-07-22 ENCOUNTER — Other Ambulatory Visit: Payer: Self-pay | Admitting: Medical

## 2021-07-22 DIAGNOSIS — M545 Low back pain, unspecified: Secondary | ICD-10-CM

## 2021-07-23 MED ORDER — TRAMADOL HCL 50 MG PO TABS: 50.0000 mg | ORAL_TABLET | Freq: Four times a day (QID) | ORAL | 0 refills | Status: DC | PRN

## 2021-08-05 DIAGNOSIS — D485 Neoplasm of uncertain behavior of skin: Secondary | ICD-10-CM | POA: Diagnosis not present

## 2021-08-05 DIAGNOSIS — L281 Prurigo nodularis: Secondary | ICD-10-CM | POA: Diagnosis not present

## 2021-08-20 ENCOUNTER — Other Ambulatory Visit: Payer: Self-pay | Admitting: Medical

## 2021-10-05 ENCOUNTER — Other Ambulatory Visit: Payer: Self-pay | Admitting: Medical

## 2021-10-05 NOTE — Telephone Encounter (Addendum)
Requesting: tramadol ?PRXYVOPF:[id number] ?UDS:9/22 ?Last Visit:9/22 ?Next Visit:n/a ?Last Refill: ? ?Please Advise  ? ?Rx refill sent to pt pharmacy. ? ?Mackie Pai, PA-C  ?

## 2021-10-29 ENCOUNTER — Other Ambulatory Visit: Payer: Self-pay | Admitting: Medical

## 2021-10-29 DIAGNOSIS — M545 Low back pain, unspecified: Secondary | ICD-10-CM

## 2021-11-01 DIAGNOSIS — L02231 Carbuncle of abdominal wall: Secondary | ICD-10-CM | POA: Diagnosis not present

## 2021-11-09 ENCOUNTER — Encounter: Payer: Self-pay | Admitting: Medical

## 2021-11-09 MED ORDER — MELOXICAM 7.5 MG PO TABS: 7.5000 mg | ORAL_TABLET | Freq: Every day | ORAL | 0 refills | Status: DC

## 2021-12-14 ENCOUNTER — Other Ambulatory Visit: Payer: Self-pay | Admitting: Medical

## 2021-12-15 ENCOUNTER — Other Ambulatory Visit: Payer: Self-pay | Admitting: Medical

## 2021-12-15 NOTE — Telephone Encounter (Addendum)
Requesting: tramadol Contract: 04/16/21 UDS: 04/03/21 Last Visit:04/03/21 Next Visit:n/a Last Refill: 10/05/21  Please Advise   Rx refill sent to pt pharmacy.  Mackie Pai, PA-C

## 2021-12-24 ENCOUNTER — Encounter: Payer: Self-pay | Admitting: Medical

## 2021-12-29 NOTE — Telephone Encounter (Signed)
Called Pt and was advised ,appointment was made to discuss Rx

## 2022-01-03 ENCOUNTER — Other Ambulatory Visit: Payer: Self-pay | Admitting: Medical

## 2022-01-14 ENCOUNTER — Ambulatory Visit: Payer: 59 | Admitting: Cardiology

## 2022-01-14 ENCOUNTER — Encounter: Payer: Self-pay | Admitting: Cardiology

## 2022-01-14 VITALS — BP 134/90 | HR 91 | Temp 98.0°F | Resp 16 | Ht 66.0 in | Wt 195.0 lb

## 2022-01-14 DIAGNOSIS — F17210 Nicotine dependence, cigarettes, uncomplicated: Secondary | ICD-10-CM | POA: Insufficient documentation

## 2022-01-14 DIAGNOSIS — Z136 Encounter for screening for cardiovascular disorders: Secondary | ICD-10-CM | POA: Insufficient documentation

## 2022-01-14 NOTE — Progress Notes (Signed)
Patient referred by Mackie Pai, PA-C for screening for PAD  Subjective:   Laurie Chang, female    DOB: 08-07-1965, 56 y.o.   MRN: [id number]   Chief Complaint  Patient presents with   PVD   New Patient (Initial Visit)    HPI  56 y.o. African American female with hypertension, hyperlipidemia, referred for evaluation of PAD  Patient was recently seen by PCP and podiatrist for discoloration of bilateral toenails.  She is going to have her "toenails removed" by her podiatrist in [place].  Given her risk factors, she was referred for screening for PAD.  Patient states active at her job, with a lot of walking and heavy lifting.  With all this activity, she does not have any claudication symptoms.  She does have smoking history, recently quit.  She has family history of CAD.  Past Medical History:  Diagnosis Date   Arthritis    Asthma    as a child    Hyperlipidemia    Hypertension    Leiomyoma uteri    Smoker    1/2 PPD   Syncope and collapse      Past Surgical History:  Procedure Laterality Date   ABDOMINAL HYSTERECTOMY  12/11/10   TAH,RSO   HYSTEROSCOPY  11/2000   HYSTEROSCOPIC MYOMECTOMY   LAPAROSCOPIC CHOLECYSTECTOMY     OOPHORECTOMY  12/11/10   TAH,RSO   TUBAL LIGATION     VEIN SURGERY       Social History   Tobacco Use  Smoking Status Former   Packs/day: 0.50   Years: 30.00   Total pack years: 15.00   Types: Cigarettes   Quit date: 2023   Years since quitting: 0.4  Smokeless Tobacco Never    Social History   Substance and Sexual Activity  Alcohol Use Not Currently   Comment: once in a blue moon    Family History  Problem Relation Age of Onset   Cancer Mother        LUNG   Hypertension Father    Heart disease Father    Hypertension Brother    Cancer Maternal Aunt 30       breast   Cancer Maternal Grandmother 80       breast   Breast cancer Maternal Grandmother    Colon cancer Neg Hx    Colon polyps Neg Hx    Esophageal  cancer Neg Hx    Stomach cancer Neg Hx    Rectal cancer Neg Hx      Current Outpatient Medications:    aspirin 81 MG tablet, Take 81 mg by mouth daily., Disp: , Rfl:    atorvastatin (LIPITOR) 10 MG tablet, TAKE 1 TABLET BY MOUTH EVERY DAY, Disp: 90 tablet, Rfl: 3   cyclobenzaprine (FLEXERIL) 10 MG tablet, TAKE 0.5-1 TABLETS BY MOUTH 3 TIMES DAILY AS NEEDED FOR MUSCLE SPASMS., Disp: 21 tablet, Rfl: 1   losartan-hydrochlorothiazide (HYZAAR) 100-12.5 MG tablet, Take 1 tablet by mouth daily., Disp: 30 tablet, Rfl: 3   losartan-hydrochlorothiazide (HYZAAR) 50-12.5 MG tablet, TAKE 1 TABLET BY MOUTH EVERY DAY, Disp: 90 tablet, Rfl: 1   meloxicam (MOBIC) 7.5 MG tablet, TAKE 1 TABLET BY MOUTH EVERY DAY, Disp: 30 tablet, Rfl: 0   Multiple Vitamins-Minerals (ONE-A-DAY WOMENS PO), Take by mouth., Disp: , Rfl:    traMADol (ULTRAM) 50 MG tablet, TAKE 1 TABLET BY MOUTH EVERY 6 HOURS AS NEEDED FOR UP TO 5 DAYS., Disp: 30 tablet, Rfl: 0   Cardiovascular and other  pertinent studies:  Reviewed external labs and tests, independently interpreted  EKG 01/14/2022: Sinus rhythm 93 bpm  Low voltage in precordial leads  Recent labs: 04/03/2021: Glucose 91, BUN/Cr 17/0.76. EGFR 87. Na/K 139/4.3. Rest of the CMP normal H/H 14/43. MCV 93. Platelets 300 HbA1C NA Chol 130, TG 86, HDL 44, LDL 69 TSH NA    Review of Systems  Cardiovascular:  Negative for chest pain, dyspnea on exertion, leg swelling, palpitations and syncope.         Vitals:   01/14/22 0848 01/14/22 0858  BP: (!) 157/83 134/90  Pulse: 100 91  Resp: 16   Temp: 98 F (36.7 C)   SpO2: 95% 96%     Body mass index is 31.47 kg/m. Filed Weights   01/14/22 0848  Weight: 195 lb (88.5 kg)     Objective:   Physical Exam Vitals and nursing note reviewed.  Constitutional:      General: She is not in acute distress. Neck:     Vascular: No JVD.  Cardiovascular:     Rate and Rhythm: Normal rate and regular rhythm.     Pulses:           Dorsalis pedis pulses are 2+ on the right side and 2+ on the left side.       Posterior tibial pulses are 2+ on the right side and 2+ on the left side.     Heart sounds: Normal heart sounds. No murmur heard. Pulmonary:     Effort: Pulmonary effort is normal.     Breath sounds: Normal breath sounds. No wheezing or rales.  Musculoskeletal:     Left lower leg: No edema.         Visit diagnoses:   ICD-10-CM   1. Encounter for screening for vascular disease  Z13.6 EKG 12-Lead    2. Cigarette nicotine dependence without complication  E36.629 PCV ANKLE BRACHIAL INDEX (ABI)       Orders Placed This Encounter  Procedures   EKG 12-Lead      Assessment & Recommendations:    56 y.o. African American female with hypertension, hyperlipidemia, referred for evaluation of PAD  PAD Screening: Normal vascular exam.  My suspicion for significant PAD is extremely low.  Will obtain screening ABI.  I do not think this should delay her upcoming toenail procedure by podiatry.  Primary prevention: Given her risk factor of smoking, and family history, she would benefit from CT cardiac scoring.  However, she also probably needs lung cancer screening CTs chest.  I will defer this to PCP.  The CT chest should also show at least presence or absence of coronary calcification. Continue Lipitor, with which lipids are well controlled. I do not think she needs aspirin for primary prevention, therefore stopped. I encouraged her to quit smoking  Thank you for referring the patient to [place]. Please feel free to contact with any questions.   Nigel Mormon, MD Pager: [phone] Office: [phone]

## 2022-01-18 ENCOUNTER — Ambulatory Visit (INDEPENDENT_AMBULATORY_CARE_PROVIDER_SITE_OTHER): Payer: 59 | Admitting: Medical

## 2022-01-18 ENCOUNTER — Encounter: Payer: Self-pay | Admitting: Medical

## 2022-01-18 VITALS — BP 138/80 | HR 77 | Temp 98.2°F | Resp 18 | Ht 66.0 in | Wt 193.0 lb

## 2022-01-18 DIAGNOSIS — Z1231 Encounter for screening mammogram for malignant neoplasm of breast: Secondary | ICD-10-CM

## 2022-01-18 DIAGNOSIS — E66811 Obesity, class 1: Secondary | ICD-10-CM

## 2022-01-18 DIAGNOSIS — R5383 Other fatigue: Secondary | ICD-10-CM

## 2022-01-18 DIAGNOSIS — Z79899 Other long term (current) drug therapy: Secondary | ICD-10-CM

## 2022-01-18 DIAGNOSIS — R739 Hyperglycemia, unspecified: Secondary | ICD-10-CM

## 2022-01-18 DIAGNOSIS — M25562 Pain in left knee: Secondary | ICD-10-CM

## 2022-01-18 DIAGNOSIS — M25561 Pain in right knee: Secondary | ICD-10-CM

## 2022-01-18 DIAGNOSIS — Z0000 Encounter for general adult medical examination without abnormal findings: Secondary | ICD-10-CM | POA: Diagnosis not present

## 2022-01-18 DIAGNOSIS — I10 Essential (primary) hypertension: Secondary | ICD-10-CM

## 2022-01-18 DIAGNOSIS — Z801 Family history of malignant neoplasm of trachea, bronchus and lung: Secondary | ICD-10-CM

## 2022-01-18 DIAGNOSIS — G8929 Other chronic pain: Secondary | ICD-10-CM

## 2022-01-18 DIAGNOSIS — E669 Obesity, unspecified: Secondary | ICD-10-CM

## 2022-01-18 DIAGNOSIS — Z87891 Personal history of nicotine dependence: Secondary | ICD-10-CM | POA: Diagnosis not present

## 2022-01-18 LAB — CBC WITH DIFFERENTIAL/PLATELET
Basophils Absolute: 0 10*3/uL (ref 0.0–0.1)
Basophils Relative: 0.6 % (ref 0.0–3.0)
Eosinophils Absolute: 0.2 10*3/uL (ref 0.0–0.7)
Eosinophils Relative: 2.9 % (ref 0.0–5.0)
HCT: 43.8 % (ref 36.0–46.0)
Hemoglobin: 14.7 g/dL (ref 12.0–15.0)
Lymphocytes Relative: 44.2 % (ref 12.0–46.0)
Lymphs Abs: 3.3 10*3/uL (ref 0.7–4.0)
MCHC: 33.5 g/dL (ref 30.0–36.0)
MCV: 92.7 fl (ref 78.0–100.0)
Monocytes Absolute: 0.4 10*3/uL (ref 0.1–1.0)
Monocytes Relative: 5.5 % (ref 3.0–12.0)
Neutro Abs: 3.5 10*3/uL (ref 1.4–7.7)
Neutrophils Relative %: 46.8 % (ref 43.0–77.0)
Platelets: 350 10*3/uL (ref 150.0–400.0)
RBC: 4.73 Mil/uL (ref 3.87–5.11)
RDW: 13 % (ref 11.5–15.5)
WBC: 7.4 10*3/uL (ref 4.0–10.5)

## 2022-01-18 LAB — LIPID PANEL
Cholesterol: 145 mg/dL (ref 0–200)
HDL: 46.4 mg/dL (ref >39.00)
LDL Cholesterol: 74 mg/dL (ref 0–99)
NonHDL: 98.15
Total CHOL/HDL Ratio: 3
Triglycerides: 119 mg/dL (ref 0.0–149.0)
VLDL: 23.8 mg/dL (ref 0.0–40.0)

## 2022-01-18 LAB — HEMOGLOBIN A1C: Hgb A1c MFr Bld: 6.2 % (ref 4.6–6.5)

## 2022-01-18 LAB — COMPREHENSIVE METABOLIC PANEL WITH GFR
ALT: 26 U/L (ref 0–35)
AST: 22 U/L (ref 0–37)
Albumin: 4.5 g/dL (ref 3.5–5.2)
Alkaline Phosphatase: 65 U/L (ref 39–117)
BUN: 13 mg/dL (ref 6–23)
CO2: 28 meq/L (ref 19–32)
Calcium: 10.1 mg/dL (ref 8.4–10.5)
Chloride: 102 meq/L (ref 96–112)
Creatinine, Ser: 0.79 mg/dL (ref 0.40–1.20)
GFR: 82.71 mL/min
Glucose, Bld: 85 mg/dL (ref 70–99)
Potassium: 4.2 meq/L (ref 3.5–5.1)
Sodium: 138 meq/L (ref 135–145)
Total Bilirubin: 0.6 mg/dL (ref 0.2–1.2)
Total Protein: 7.3 g/dL (ref 6.0–8.3)

## 2022-01-18 LAB — T4, FREE: Free T4: 0.97 ng/dL (ref 0.60–1.60)

## 2022-01-18 LAB — TSH: TSH: 2.04 u[IU]/mL (ref 0.35–5.50)

## 2022-01-18 MED ORDER — LOSARTAN POTASSIUM-HCTZ 100-12.5 MG PO TABS: 1.0000 | ORAL_TABLET | Freq: Every day | ORAL | 3 refills | Status: DC

## 2022-01-18 MED ORDER — SEMAGLUTIDE-WEIGHT MANAGEMENT 0.25 MG/0.5ML ~~LOC~~ SOAJ: 0.2500 mg | SUBCUTANEOUS | 1 refills | Status: DC

## 2022-01-18 NOTE — Patient Instructions (Addendum)
For you wellness exam today I have ordered cbc, cmp and  lipid panel.  Vaccine up to date. Pt deciding not to get covid vaccine further at this point.  Recommend exercise and healthy diet.  We will let you know lab results as they come in.  Follow up date appointment will be determined after lab review.    Htn- bp borderline. Will increase your losartan back to 100/12.5 mg 1 tab po q day.  Knee pain worse- continue melixcam and rx tramadol to use if needed. Uds today and update contract. Xray of both knees today.  Obesity- get labs today. Check a1c since mild sugar elvation in past. If normal a1c can try to rx wegovy. Rx advisement given.  Refer to pulmonologist for possible ct chest screening in light of smoking hx and mom hx of lung cancer.  For fatigue check tsh and t4.  Preventive Care 40-19 Years Old, Female Preventive care refers to lifestyle choices and visits with your health care provider that can promote health and wellness. Preventive care visits are also called wellness exams. What can I expect for my preventive care visit? Counseling Your health care provider may ask you questions about your: Medical history, including: Past medical problems. Family medical history. Pregnancy history. Current health, including: Menstrual cycle. Method of birth control. Emotional well-being. Home life and relationship well-being. Sexual activity and sexual health. Lifestyle, including: Alcohol, nicotine or tobacco, and drug use. Access to firearms. Diet, exercise, and sleep habits. Work and work Statistician. Sunscreen use. Safety issues such as seatbelt and bike helmet use. Physical exam Your health care provider will check your: Height and weight. These may be used to calculate your BMI (body mass index). BMI is a measurement that tells if you are at a healthy weight. Waist circumference. This measures the distance around your waistline. This measurement also tells if you are  at a healthy weight and may help predict your risk of certain diseases, such as type 2 diabetes and high blood pressure. Heart rate and blood pressure. Body temperature. Skin for abnormal spots. What immunizations do I need?  Vaccines are usually given at various ages, according to a schedule. Your health care provider will recommend vaccines for you based on your age, medical history, and lifestyle or other factors, such as travel or where you work. What tests do I need? Screening Your health care provider may recommend screening tests for certain conditions. This may include: Lipid and cholesterol levels. Diabetes screening. This is done by checking your blood sugar (glucose) after you have not eaten for a while (fasting). Pelvic exam and Pap test. Hepatitis B test. Hepatitis C test. HIV (human immunodeficiency virus) test. STI (sexually transmitted infection) testing, if you are at risk. Lung cancer screening. Colorectal cancer screening. Mammogram. Talk with your health care provider about when you should start having regular mammograms. This may depend on whether you have a family history of breast cancer. BRCA-related cancer screening. This may be done if you have a family history of breast, ovarian, tubal, or peritoneal cancers. Bone density scan. This is done to screen for osteoporosis. Talk with your health care provider about your test results, treatment options, and if necessary, the need for more tests. Follow these instructions at home: Eating and drinking  Eat a diet that includes fresh fruits and vegetables, whole grains, lean protein, and low-fat dairy products. Take vitamin and mineral supplements as recommended by your health care provider. Do not drink alcohol if: Your health care  provider tells you not to drink. You are pregnant, may be pregnant, or are planning to become pregnant. If you drink alcohol: Limit how much you have to 0-1 drink a day. Know how much  alcohol is in your drink. In the [place] one drink equals one 12 oz bottle of beer (355 mL), one 5 oz glass of wine (148 mL), or one 1 oz glass of hard liquor (44 mL). Lifestyle Brush your teeth every morning and night with fluoride toothpaste. Floss one time each day. Exercise for at least 30 minutes 5 or more days each week. Do not use any products that contain nicotine or tobacco. These products include cigarettes, chewing tobacco, and vaping devices, such as e-cigarettes. If you need help quitting, ask your health care provider. Do not use drugs. If you are sexually active, practice safe sex. Use a condom or other form of protection to prevent STIs. If you do not wish to become pregnant, use a form of birth control. If you plan to become pregnant, see your health care provider for a prepregnancy visit. Take aspirin only as told by your health care provider. Make sure that you understand how much to take and what form to take. Work with your health care provider to find out whether it is safe and beneficial for you to take aspirin daily. Find healthy ways to manage stress, such as: Meditation, yoga, or listening to music. Journaling. Talking to a trusted person. Spending time with friends and family. Minimize exposure to UV radiation to reduce your risk of skin cancer. Safety Always wear your seat belt while driving or riding in a vehicle. Do not drive: If you have been drinking alcohol. Do not ride with someone who has been drinking. When you are tired or distracted. While texting. If you have been using any mind-altering substances or drugs. Wear a helmet and other protective equipment during sports activities. If you have firearms in your house, make sure you follow all gun safety procedures. Seek help if you have been physically or sexually abused. What's next? Visit your health care provider once a year for an annual wellness visit. Ask your health care provider how often you should  have your eyes and teeth checked. Stay up to date on all vaccines. This information is not intended to replace advice given to you by your health care provider. Make sure you discuss any questions you have with your health care provider. Document Revised: 12/31/2020 Document Reviewed: 12/31/2020 Elsevier Patient Education  [place].

## 2022-01-18 NOTE — Progress Notes (Signed)
Subjective:    Patient ID: Laurie Chang, female    DOB: 1965-07-29, 56 y.o.   MRN: [id number]  HPI Pt is fasting. Pt working at [employer]. Pt is eating better and had purposeful wt loss. Pt is dong some light weights and sit ups at home. She is walking about 20,000 steps a day. Pt quit smoking but vapes rarely.Marland Kitchen Pt failed various cessation methods including wellbutrin(she used for one month only).   No alcohol use. Pt drinks 1.5 cups of coffee a day.  Last mammogram negative. Never had abnormal. Fh breast ca mom sister and aunt on mom side. But not mother. Her sisters no breast CA. Placed mammogram order today to get done in October.  Htn-  bp mild high. Pt is on losartan/hyzaar.   Bilateral knee pain intermitently. Recently more than usual. Recently every day for past 6 months. Back hurts after activity lifting furniture at work. Taking tramadol 3 days a week once a day. Taking meloxicam daily.  Pt is obese. No hx of thyroid cancer in family and no pancreatitis. Bm over 30. Gained wt since quit smoking.    Review of Systems  Constitutional:  Positive for fatigue. Negative for chills and fever.  HENT:  Negative for congestion, drooling and ear discharge.   Respiratory:  Negative for cough, chest tightness, shortness of breath and wheezing.   Cardiovascular:  Negative for chest pain and palpitations.  Gastrointestinal:  Negative for abdominal pain.  Genitourinary:  Negative for dyspareunia, dysuria and enuresis.  Musculoskeletal:  Negative for back pain and myalgias.  Neurological:  Negative for dizziness, speech difficulty, weakness, numbness and headaches.  Hematological:  Negative for adenopathy. Does not bruise/bleed easily.  Psychiatric/Behavioral:  Negative for behavioral problems and confusion.    Past Medical History:  Diagnosis Date   Arthritis    Asthma    as a child    Hyperlipidemia    Hypertension    Leiomyoma uteri    Smoker    1/2 PPD   Syncope and collapse       Social History   Socioeconomic History   Marital status: Married    Spouse name: Not on file   Number of children: 3   Years of education: 12   Highest education level: Not on file  Occupational History   Occupation: Med Engineer, production: [employer]    Comment: [employer]  Tobacco Use   Smoking status: Former    Packs/day: 0.50    Years: 30.00    Total pack years: 15.00    Types: Cigarettes    Quit date: 2023    Years since quitting: 0.5   Smokeless tobacco: Never  Vaping Use   Vaping Use: Every day  Substance and Sexual Activity   Alcohol use: Not Currently    Comment: once in a blue moon   Drug use: No   Sexual activity: Yes    Partners: Male    Birth control/protection: Surgical  Other Topics Concern   Not on file  Social History Narrative   Widowed in 2012.  Lives with her daughter and two granddaughters.   Social Determinants of Health   Financial Resource Strain: Not on file  Food Insecurity: Not on file  Transportation Needs: Not on file  Physical Activity: Not on file  Stress: Not on file  Social Connections: Not on file  Intimate Partner Violence: Not on file    Past Surgical History:  Procedure Laterality  Date   ABDOMINAL HYSTERECTOMY  12/11/10   TAH,RSO   HYSTEROSCOPY  11/2000   HYSTEROSCOPIC MYOMECTOMY   LAPAROSCOPIC CHOLECYSTECTOMY     OOPHORECTOMY  12/11/10   TAH,RSO   TUBAL LIGATION     VEIN SURGERY      Family History  Problem Relation Age of Onset   Cancer Mother        LUNG   Hypertension Father    Heart disease Father    Hypertension Brother    Cancer Maternal Aunt 76       breast   Cancer Maternal Grandmother 80       breast   Breast cancer Maternal Grandmother    Colon cancer Neg Hx    Colon polyps Neg Hx    Esophageal cancer Neg Hx    Stomach cancer Neg Hx    Rectal cancer Neg Hx     No Known Allergies  Current Outpatient Medications on File Prior to Visit  Medication Sig  Dispense Refill   atorvastatin (LIPITOR) 10 MG tablet TAKE 1 TABLET BY MOUTH EVERY DAY 90 tablet 3   cyclobenzaprine (FLEXERIL) 10 MG tablet TAKE 0.5-1 TABLETS BY MOUTH 3 TIMES DAILY AS NEEDED FOR MUSCLE SPASMS. 21 tablet 1   losartan-hydrochlorothiazide (HYZAAR) 100-12.5 MG tablet Take 1 tablet by mouth daily. 30 tablet 3   losartan-hydrochlorothiazide (HYZAAR) 50-12.5 MG tablet TAKE 1 TABLET BY MOUTH EVERY DAY 90 tablet 1   meloxicam (MOBIC) 7.5 MG tablet TAKE 1 TABLET BY MOUTH EVERY DAY 30 tablet 0   Multiple Vitamins-Minerals (ONE-A-DAY WOMENS PO) Take by mouth.     traMADol (ULTRAM) 50 MG tablet TAKE 1 TABLET BY MOUTH EVERY 6 HOURS AS NEEDED FOR UP TO 5 DAYS. 30 tablet 0   No current facility-administered medications on file prior to visit.    BP 138/80   Pulse 77   Temp 98.2 F (36.8 C)   Resp 18   Ht '5\' 6"'$  (1.676 m)   Wt 193 lb (87.5 kg)   LMP 08/19/2010   SpO2 99%   BMI 31.15 kg/m          Objective:   Physical Exam  General Mental Status- Alert. General Appearance- Not in acute distress.   Skin General: Color- Normal Color. Moisture- Normal Moisture.  Neck Carotid Arteries- Normal color. Moisture- Normal Moisture. No carotid bruits. No JVD.  Chest and Lung Exam Auscultation: Breath Sounds:-Normal.  Cardiovascular Auscultation:Rythm- Regular. Murmurs & Other Heart Sounds:Auscultation of the heart reveals- No Murmurs.  Abdomen Inspection:-Inspeection Normal. Palpation/Percussion:Note:No mass. Palpation and Percussion of the abdomen reveal- Non Tender, Non Distended + BS, no rebound or guarding.   Neurologic Cranial Nerve exam:- CN III-XII intact(No nystagmus), symmetric smile. Strength:- 5/5 equal and symmetric strength both upper and lower extremities.       Assessment & Plan:   Patient Instructions  For you wellness exam today I have ordered cbc, cmp and  lipid panel.  Vaccine up to date. Pt deciding not to get covid vaccine further at this  point.  Recommend exercise and healthy diet.  We will let you know lab results as they come in.  Follow up date appointment will be determined after lab review.    Htn- bp borderline. Will increase your losartan back to 100/12.5 mg 1 tab po q day.  Knee pain worse- continue melixcam and rx tramadol to use if needed. Uds today and update contract.  Obesity- get labs today. Check a1c since mild sugar elvation in past.  If normal a1c can try to rx wegovy. Rx advisement given.  Refer to pulmonologist for possible ct chest screening in light of smoking hx and mom hx of lung cancer.  For fatigue check tsh and t4.        Mackie Pai, [place]  [address] charge as well in addition to wellness. Evaluate and treated. Htn, knee pain, obsesity and fatigue.

## 2022-01-18 NOTE — Addendum Note (Signed)
Addended by: Anabel Halon on: 01/18/2022 06:35 PM   Modules accepted: Orders

## 2022-01-20 LAB — DRUG MONITORING, PANEL 8 WITH CONFIRMATION, URINE
6 Acetylmorphine: NEGATIVE ng/mL (ref <10)
Alcohol Metabolites: NEGATIVE ng/mL (ref <500)
Amphetamines: NEGATIVE ng/mL (ref <500)
Benzodiazepines: NEGATIVE ng/mL (ref <100)
Buprenorphine, Urine: NEGATIVE ng/mL (ref <5)
Cocaine Metabolite: NEGATIVE ng/mL (ref <150)
Creatinine: 19.8 mg/dL — ABNORMAL LOW (ref >=20.0)
MDMA: NEGATIVE ng/mL (ref <500)
Marijuana Metabolite: NEGATIVE ng/mL (ref <20)
Opiates: NEGATIVE ng/mL (ref <100)
Oxidant: NEGATIVE ug/mL (ref <200)
Oxycodone: NEGATIVE ng/mL (ref <100)
Specific Gravity: 1.003 (ref >=1.003)
pH: 6.6 (ref 4.5–9.0)

## 2022-01-20 LAB — DM TEMPLATE

## 2022-02-03 ENCOUNTER — Encounter: Payer: Self-pay | Admitting: Medical

## 2022-02-04 MED ORDER — SEMAGLUTIDE-WEIGHT MANAGEMENT 0.25 MG/0.5ML ~~LOC~~ SOAJ: 0.2500 mg | SUBCUTANEOUS | 1 refills | Status: DC

## 2022-02-08 ENCOUNTER — Ambulatory Visit: Payer: 59

## 2022-02-08 DIAGNOSIS — F17210 Nicotine dependence, cigarettes, uncomplicated: Secondary | ICD-10-CM

## 2022-02-09 ENCOUNTER — Other Ambulatory Visit: Payer: Self-pay | Admitting: Medical

## 2022-02-10 MED ORDER — TRAMADOL HCL 50 MG PO TABS: 50.0000 mg | ORAL_TABLET | Freq: Four times a day (QID) | ORAL | 0 refills | Status: DC | PRN

## 2022-02-10 NOTE — Telephone Encounter (Addendum)
Requesting: tramadol Contract:01/18/22 UDS:01/18/22 Last Visit:01/18/22 Next Visit:n/a Last Refill:12/15/21  Please Advise   Rx refill sent to pt pharmacy.  Mackie Pai, PA-C

## 2022-02-14 ENCOUNTER — Other Ambulatory Visit: Payer: Self-pay | Admitting: Medical

## 2022-02-15 ENCOUNTER — Ambulatory Visit (HOSPITAL_BASED_OUTPATIENT_CLINIC_OR_DEPARTMENT_OTHER)
Admission: RE | Admit: 2022-02-15 | Discharge: 2022-02-15 | Disposition: A | Discharge status: Home / Self Care | Payer: 59 | Source: Ambulatory Visit | Attending: Medical | Admitting: Medical

## 2022-02-15 ENCOUNTER — Telehealth: Payer: Self-pay

## 2022-02-15 ENCOUNTER — Other Ambulatory Visit: Payer: Self-pay | Admitting: Medical

## 2022-02-15 DIAGNOSIS — M25562 Pain in left knee: Secondary | ICD-10-CM | POA: Insufficient documentation

## 2022-02-15 DIAGNOSIS — M25561 Pain in right knee: Secondary | ICD-10-CM | POA: Insufficient documentation

## 2022-02-15 DIAGNOSIS — G8929 Other chronic pain: Secondary | ICD-10-CM | POA: Diagnosis present

## 2022-02-15 DIAGNOSIS — Z1231 Encounter for screening mammogram for malignant neoplasm of breast: Secondary | ICD-10-CM

## 2022-02-15 MED ORDER — MELOXICAM 7.5 MG PO TABS: 7.5000 mg | ORAL_TABLET | Freq: Every day | ORAL | 0 refills | Status: DC

## 2022-02-15 NOTE — Telephone Encounter (Signed)
Laurie Chang ([name]) Rx #: [id number] 0.'25MG'$ /0.5ML auto-injectors   Form [facility] Electronic Prior Authorization Form (2017 NCPDP) Created 4 days ago Sent to Plan 1 minute ago Plan Response 1 minute ago Submit Clinical Questions Determination Message from Plan This medication or product is on your plan's list of covered drugs. Prior authorization is not required at this time. If your pharmacy has questions regarding the processing of your prescription, please have them call the [facility] pharmacy help desk at [phone]. **Please note: This request was submitted electronically. Formulary lowering, tiering exception, cost reduction and/or pre-benefit determination review (including prospective Medicare hospice reviews) requests cannot be requested using this method of submission. Providers contact us at [phone] for further assistance.

## 2022-02-21 ENCOUNTER — Encounter: Payer: Self-pay | Admitting: Medical

## 2022-02-22 ENCOUNTER — Other Ambulatory Visit: Payer: Self-pay | Admitting: *Deleted

## 2022-02-22 DIAGNOSIS — M545 Low back pain, unspecified: Secondary | ICD-10-CM

## 2022-02-22 MED ORDER — CYCLOBENZAPRINE HCL 10 MG PO TABS: ORAL_TABLET | ORAL | 1 refills | Status: DC

## 2022-02-26 ENCOUNTER — Telehealth (HOSPITAL_BASED_OUTPATIENT_CLINIC_OR_DEPARTMENT_OTHER): Payer: Self-pay

## 2022-03-06 ENCOUNTER — Other Ambulatory Visit: Payer: Self-pay | Admitting: Medical

## 2022-03-08 MED ORDER — ATORVASTATIN CALCIUM 10 MG PO TABS: 10.0000 mg | ORAL_TABLET | Freq: Every day | ORAL | 1 refills | Status: DC

## 2022-03-25 ENCOUNTER — Encounter: Payer: Self-pay | Admitting: Medical

## 2022-03-25 ENCOUNTER — Other Ambulatory Visit: Payer: Self-pay | Admitting: Medical

## 2022-03-26 MED ORDER — ATORVASTATIN CALCIUM 10 MG PO TABS: 10.0000 mg | ORAL_TABLET | Freq: Every day | ORAL | 1 refills | Status: DC

## 2022-03-26 NOTE — Telephone Encounter (Signed)
Requesting: tramadol Contract:01/18/22 UDS:01/18/22 Last Visit:01/18/22 Next Visit:n/a Last Refill:02/10/22  Please Advise

## 2022-04-08 DIAGNOSIS — F17200 Nicotine dependence, unspecified, uncomplicated: Secondary | ICD-10-CM | POA: Insufficient documentation

## 2022-04-08 DIAGNOSIS — S8261XA Displaced fracture of lateral malleolus of right fibula, initial encounter for closed fracture: Secondary | ICD-10-CM | POA: Insufficient documentation

## 2022-04-08 DIAGNOSIS — S93401A Sprain of unspecified ligament of right ankle, initial encounter: Secondary | ICD-10-CM | POA: Insufficient documentation

## 2022-04-20 ENCOUNTER — Ambulatory Visit: Payer: 59

## 2022-04-29 ENCOUNTER — Other Ambulatory Visit: Payer: Self-pay | Admitting: Medical

## 2022-04-30 NOTE — Telephone Encounter (Addendum)
Requesting: tramadol Contract:01/18/22 UDS:01/18/22 Last Visit:01/18/22 Next Visit:n/a Last Refill:03/29/22  Please Advise   Rx refill sent.  Mackie Pai, PA-C

## 2022-05-03 ENCOUNTER — Encounter (HOSPITAL_BASED_OUTPATIENT_CLINIC_OR_DEPARTMENT_OTHER): Payer: Self-pay

## 2022-05-03 ENCOUNTER — Ambulatory Visit (HOSPITAL_BASED_OUTPATIENT_CLINIC_OR_DEPARTMENT_OTHER)
Admission: RE | Admit: 2022-05-03 | Discharge: 2022-05-03 | Disposition: A | Discharge status: Home / Self Care | Payer: 59 | Source: Ambulatory Visit | Attending: Medical | Admitting: Medical

## 2022-05-03 DIAGNOSIS — Z1231 Encounter for screening mammogram for malignant neoplasm of breast: Secondary | ICD-10-CM | POA: Insufficient documentation

## 2022-06-08 ENCOUNTER — Other Ambulatory Visit: Payer: Self-pay | Admitting: Medical

## 2022-06-08 DIAGNOSIS — M545 Low back pain, unspecified: Secondary | ICD-10-CM

## 2022-06-09 NOTE — Telephone Encounter (Addendum)
Requesting: tramadol '50mg'$   Contract: 01/18/22 UDS: 01/18/22 Last Visit: 01/18/22 Next Visit: None Last Refill: 04/30/22 #30 and 0RF   Please Advise   Refilled. Please schedule for controlled med visit in January.  Mackie Pai, PA-C

## 2022-07-14 ENCOUNTER — Encounter: Payer: Self-pay | Admitting: Medical

## 2022-07-15 NOTE — Addendum Note (Signed)
Addended by: Anabel Halon on: 07/15/2022 12:59 PM   Modules accepted: Orders

## 2022-07-16 ENCOUNTER — Other Ambulatory Visit: Payer: Self-pay | Admitting: Medical

## 2022-07-16 NOTE — Telephone Encounter (Signed)
Rx refill sent. She need to have appointment in January before any further refill send. Controlled med rules.

## 2022-07-16 NOTE — Telephone Encounter (Signed)
Requesting: Tramadol Contract: 01/18/2022 UDS: 01/18/2022 Last Visit: 01/18/2022 Next Visit:N/A Last Refill:06/09/2022  Please Advise

## 2022-07-28 ENCOUNTER — Ambulatory Visit: Payer: Self-pay

## 2022-07-28 ENCOUNTER — Encounter: Payer: Self-pay | Admitting: Family Medicine

## 2022-07-28 ENCOUNTER — Ambulatory Visit: Payer: 59 | Admitting: Family Medicine

## 2022-07-28 VITALS — BP 128/72 | Ht 65.0 in | Wt 193.0 lb

## 2022-07-28 DIAGNOSIS — M1711 Unilateral primary osteoarthritis, right knee: Secondary | ICD-10-CM

## 2022-07-28 DIAGNOSIS — M25562 Pain in left knee: Secondary | ICD-10-CM

## 2022-07-28 MED ORDER — TRIAMCINOLONE ACETONIDE 40 MG/ML IJ SUSP
40.0000 mg | Freq: Once | INTRAMUSCULAR | Status: AC
  Administered 2022-07-28: 40 mg via INTRA_ARTICULAR

## 2022-07-28 NOTE — Progress Notes (Signed)
  Laurie Chang - 57 y.o. female MRN [id number]  Date of birth: Sep 09, 1965  SUBJECTIVE:  Including CC & ROS.  No chief complaint on file.   Laurie Chang is a 57 y.o. female that is  presenting with acute on chronic right knee pain. Having pain on the anterior aspect of the knee. Worse at the end of the day. Has been taking tramadol. No prior surgery.  Independent review of the right and left knee from 7/31 shows mild degenerative changes in the patellofemoral arthritis.   Review of Systems See HPI   HISTORY: Past Medical, Surgical, Social, and Family History Reviewed & Updated per EMR.   Pertinent Historical Findings include:  Past Medical History:  Diagnosis Date   Arthritis    Asthma    as a child    Hyperlipidemia    Hypertension    Leiomyoma uteri    Smoker    1/2 PPD   Syncope and collapse     Past Surgical History:  Procedure Laterality Date   ABDOMINAL HYSTERECTOMY  12/11/10   TAH,RSO   HYSTEROSCOPY  11/2000   HYSTEROSCOPIC MYOMECTOMY   LAPAROSCOPIC CHOLECYSTECTOMY     OOPHORECTOMY  12/11/10   TAH,RSO   TUBAL LIGATION     VEIN SURGERY       PHYSICAL EXAM:  VS: BP 128/72   Ht '5\' 5"'$  (1.651 m)   Wt 193 lb (87.5 kg)   LMP 08/19/2010   BMI 32.12 kg/m  Physical Exam Gen: NAD, alert, cooperative with exam, well-appearing MSK:  Neurovascularly intact    Limited ultrasound: right knee pain:  Trace effusion  Normal appearing quadriceps and patellar tendon Mild degenerative changes of the medial and lateral meniscus  Effusion appreciated on the medial aspect of the patella  Summary: Findings consistent with degenerative changes of the knee  Ultrasound and interpretation by Clearance Coots, MD   Aspiration/Injection Procedure Note Laurie Chang [birth year]  Procedure: Injection Indications: right knee pain  Procedure Details Consent: Risks of procedure as well as the alternatives and risks of each were explained to the  (patient/caregiver).  Consent for procedure obtained. Time Out: Verified patient identification, verified procedure, site/side was marked, verified correct patient position, special equipment/implants available, medications/allergies/relevent history reviewed, required imaging and test results available.  Performed.  The area was cleaned with iodine and alcohol swabs.    The right knee superior lateral suprapatellar pouch was injected using 1 cc of 1% lidocaine on a 22-gauge 1-1/2 inch needle.  The syringe was switched to mixture containing 1 cc's of 40 mg Kenalog and 4 cc's of 0.25% bupivacaine was injected.  Ultrasound was used. Images were obtained in long views showing the injection.     A sterile dressing was applied.  Patient did tolerate procedure well.   ASSESSMENT & PLAN:   Primary osteoarthritis of right knee Acute on chronic in nature. Effusion noted more on the medial aspect of the patella.  - counseled on home exercise therapy and supportive care - injection today  - green sport insoles - could consider PT or gel injection.

## 2022-07-28 NOTE — Patient Instructions (Signed)
Nice to meet you Please try the insoles  Please try the exercises  You can use over the counter voltaren gel if needed  You can consider a compression sleeve   Please send me a message in [place] with any questions or updates.  Please see me back in 4 weeks.   --Dr. Raeford Razor

## 2022-07-28 NOTE — Assessment & Plan Note (Signed)
Acute on chronic in nature. Effusion noted more on the medial aspect of the patella.  - counseled on home exercise therapy and supportive care - injection today  - green sport insoles - could consider PT or gel injection.

## 2022-08-18 ENCOUNTER — Other Ambulatory Visit: Payer: Self-pay | Admitting: Medical

## 2022-08-19 NOTE — Telephone Encounter (Signed)
Requesting: tramadol Contract:01/26/22 UDS:01/18/22 Last Visit:02/04/2022 Next Visit:n/a Last Refill:07/16/22  Please Advise

## 2022-08-20 NOTE — Telephone Encounter (Signed)
Pt called and lvm to return call to schedule an appt

## 2022-08-24 ENCOUNTER — Other Ambulatory Visit: Payer: Self-pay | Admitting: Medical

## 2022-08-24 DIAGNOSIS — M545 Low back pain, unspecified: Secondary | ICD-10-CM

## 2022-08-24 NOTE — Telephone Encounter (Signed)
Requesting: tramadol '50mg'$   Contract:01/18/22 UDS:01/18/22 Last Visit: 01/18/22 Next Visit: None Last Refill: 07/16/22 #30 and 0RF   Please Advise

## 2022-08-25 NOTE — Telephone Encounter (Signed)
Pt scheduled for 08/27/22

## 2022-08-25 NOTE — Telephone Encounter (Signed)
Appt made for 08/27/22

## 2022-08-27 ENCOUNTER — Other Ambulatory Visit: Payer: Self-pay

## 2022-08-27 ENCOUNTER — Telehealth: Payer: Self-pay

## 2022-08-27 ENCOUNTER — Other Ambulatory Visit (HOSPITAL_BASED_OUTPATIENT_CLINIC_OR_DEPARTMENT_OTHER): Payer: Self-pay

## 2022-08-27 ENCOUNTER — Encounter: Payer: Self-pay | Admitting: Medical

## 2022-08-27 ENCOUNTER — Ambulatory Visit: Payer: 59 | Admitting: Medical

## 2022-08-27 VITALS — BP 135/80 | HR 86 | Temp 98.0°F | Resp 18 | Ht 65.0 in | Wt 201.0 lb

## 2022-08-27 DIAGNOSIS — I10 Essential (primary) hypertension: Secondary | ICD-10-CM | POA: Diagnosis not present

## 2022-08-27 DIAGNOSIS — M25561 Pain in right knee: Secondary | ICD-10-CM

## 2022-08-27 DIAGNOSIS — G8929 Other chronic pain: Secondary | ICD-10-CM

## 2022-08-27 DIAGNOSIS — Z79899 Other long term (current) drug therapy: Secondary | ICD-10-CM | POA: Diagnosis not present

## 2022-08-27 DIAGNOSIS — Z23 Encounter for immunization: Secondary | ICD-10-CM | POA: Diagnosis not present

## 2022-08-27 DIAGNOSIS — M25562 Pain in left knee: Secondary | ICD-10-CM

## 2022-08-27 MED ORDER — TRAMADOL HCL 50 MG PO TABS: 50.0000 mg | ORAL_TABLET | Freq: Three times a day (TID) | ORAL | 5 refills | Status: AC | PRN

## 2022-08-27 MED ORDER — TRAZODONE HCL 50 MG PO TABS: 25.0000 mg | ORAL_TABLET | Freq: Every evening | ORAL | 3 refills | Status: DC | PRN

## 2022-08-27 MED ORDER — SEMAGLUTIDE-WEIGHT MANAGEMENT 0.5 MG/0.5ML ~~LOC~~ SOAJ
0.5000 mg | SUBCUTANEOUS | 0 refills | Status: AC
  Filled 2022-08-27: qty 2, 28d supply, fill #0

## 2022-08-27 MED ORDER — SEMAGLUTIDE-WEIGHT MANAGEMENT 1.7 MG/0.75ML ~~LOC~~ SOAJ
1.7000 mg | SUBCUTANEOUS | 0 refills | Status: AC
  Filled 2022-08-27: qty 3, 28d supply, fill #0

## 2022-08-27 MED ORDER — SEMAGLUTIDE-WEIGHT MANAGEMENT 2.4 MG/0.75ML ~~LOC~~ SOAJ
2.4000 mg | SUBCUTANEOUS | 0 refills | Status: AC
  Filled 2022-08-27: qty 3, 28d supply, fill #0

## 2022-08-27 MED ORDER — SEMAGLUTIDE-WEIGHT MANAGEMENT 1 MG/0.5ML ~~LOC~~ SOAJ
1.0000 mg | SUBCUTANEOUS | 0 refills | Status: DC
  Filled 2022-08-27: qty 2, 28d supply, fill #0

## 2022-08-27 MED ORDER — MELOXICAM 7.5 MG PO TABS: ORAL_TABLET | ORAL | 0 refills | Status: DC

## 2022-08-27 MED ORDER — CYCLOBENZAPRINE HCL 10 MG PO TABS: ORAL_TABLET | ORAL | 0 refills | Status: DC

## 2022-08-27 MED ORDER — SEMAGLUTIDE-WEIGHT MANAGEMENT 0.25 MG/0.5ML ~~LOC~~ SOAJ
0.2500 mg | SUBCUTANEOUS | 0 refills | Status: AC
  Filled 2022-08-27 – 2022-09-01 (×2): qty 2, 28d supply, fill #0

## 2022-08-27 NOTE — Addendum Note (Signed)
Addended by: Jeronimo Greaves on: 08/27/2022 10:22 AM   Modules accepted: Orders

## 2022-08-27 NOTE — Progress Notes (Signed)
Subjective:    Patient ID: Laurie Chang, female    DOB: May 11, 1966, 57 y.o.   MRN: [id number]  HPI  Pt in for follow up.  Pt needs refill of tramadol. She states knees are worst area of pain. Also pain in hands and wrist. Pt states did see ortho who states knee OA. Gave injection and did seem to help. Pt does not have any meloxicam.    Pt bp is elevated today but better on recheck.    Pt states not sleeping well. Pt 67 yo grandaughter is pregnant. Grandaughter moved in. Pt states her bp has been under control when she saw orthopedist. Recent stress and insomnia.    Review of Systems  Constitutional:  Negative for chills, fatigue and fever.  Respiratory:  Negative for cough, chest tightness, shortness of breath and wheezing.   Cardiovascular:  Negative for chest pain and palpitations.  Gastrointestinal:  Negative for abdominal pain, constipation and nausea.  Genitourinary:  Negative for dyspareunia, dysuria, frequency, pelvic pain and urgency.  Musculoskeletal:  Negative for back pain and joint swelling.  Skin:  Negative for rash.  Neurological:  Negative for dizziness, light-headedness, numbness and headaches.  Psychiatric/Behavioral:  Positive for sleep disturbance. Negative for dysphoric mood and suicidal ideas. The patient is nervous/anxious.    Past Medical History:  Diagnosis Date   Arthritis    Asthma    as a child    Hyperlipidemia    Hypertension    Leiomyoma uteri    Smoker    1/2 PPD   Syncope and collapse      Social History   Socioeconomic History   Marital status: Married    Spouse name: Not on file   Number of children: 3   Years of education: 12   Highest education level: Not on file  Occupational History   Occupation: Med Engineer, production: [employer]    Comment: [employer]  Tobacco Use   Smoking status: Former    Packs/day: 0.50    Years: 30.00    Total pack years: 15.00    Types: Cigarettes    Quit  date: 2023    Years since quitting: 1.1   Smokeless tobacco: Never  Vaping Use   Vaping Use: Every day  Substance and Sexual Activity   Alcohol use: Not Currently    Comment: once in a blue moon   Drug use: No   Sexual activity: Yes    Partners: Male    Birth control/protection: Surgical  Other Topics Concern   Not on file  Social History Narrative   Widowed in 2012.  Lives with her daughter and two granddaughters.   Social Determinants of Health   Financial Resource Strain: Not on file  Food Insecurity: Not on file  Transportation Needs: Not on file  Physical Activity: Not on file  Stress: Not on file  Social Connections: Not on file  Intimate Partner Violence: Not on file    Past Surgical History:  Procedure Laterality Date   ABDOMINAL HYSTERECTOMY  12/11/10   TAH,RSO   HYSTEROSCOPY  11/2000   HYSTEROSCOPIC MYOMECTOMY   LAPAROSCOPIC CHOLECYSTECTOMY     OOPHORECTOMY  12/11/10   TAH,RSO   TUBAL LIGATION     VEIN SURGERY      Family History  Problem Relation Age of Onset   Cancer Mother        LUNG   Hypertension Father    Heart disease Father  Hypertension Brother    Cancer Maternal Aunt 62       breast   Cancer Maternal Grandmother 80       breast   Breast cancer Maternal Grandmother    Colon cancer Neg Hx    Colon polyps Neg Hx    Esophageal cancer Neg Hx    Stomach cancer Neg Hx    Rectal cancer Neg Hx     No Known Allergies  Current Outpatient Medications on File Prior to Visit  Medication Sig Dispense Refill   atorvastatin (LIPITOR) 10 MG tablet Take 1 tablet (10 mg total) by mouth daily. 90 tablet 1   cyclobenzaprine (FLEXERIL) 10 MG tablet TAKE 1/2 TO 1 (ONE-HALF TO ONE) TABLET BY MOUTH THREE TIMES DAILY AS NEEDED FOR MUSCLE SPASM 21 tablet 0   losartan-hydrochlorothiazide (HYZAAR) 100-12.5 MG tablet Take 1 tablet by mouth daily. 90 tablet 3   meloxicam (MOBIC) 7.5 MG tablet Take 1 tablet (7.5 mg total) by mouth daily. 30 tablet 0    Multiple Vitamins-Minerals (ONE-A-DAY WOMENS PO) Take by mouth.     Semaglutide-Weight Management 0.25 MG/0.5ML SOAJ Inject 0.25 mg into the skin once a week. 2 mL 1   traMADol (ULTRAM) 50 MG tablet Take 1 tablet (50 mg total) by mouth every 6 (six) hours as needed for severe pain. 30 tablet 0   No current facility-administered medications on file prior to visit.    BP 135/80   Pulse 86   Temp 98 F (36.7 C)   Resp 18   Ht 5' 5"$  (1.651 m)   Wt 201 lb (91.2 kg)   LMP 08/19/2010   SpO2 98%   BMI 33.45 kg/m         Objective:   Physical Exam  General- No acute distress. Pleasant patient. Neck- Full range of motion, no jvd. No thyromegaly.  Lungs- Clear, even and unlabored. Heart- regular rate and rhythm. Neurologic- CNII- XII grossly intact.  Rt knee- mild crepitus on rom. Left knee- no appreaciable crepitus on rom.        Assessment & Plan:   Patient Instructions  Chronic knee pain and some wrist pain. OA dx per orthopedist. Prior inflammatory labs negative. Rx low dose meloxicam to if needed. Also rx tramadol #15 tab 1 tab po q 6 hours prn severe pain. UDS today and update contract.  Htn- bp better controlled on recheck. Want you to check bp at home when relaxed about 3 time over next week.  For insomnia will rx trazadone. If you anxious feeling are worse consider buspar.  For obesity resent your wegovy to our pharmacy downstairs. Sometimes we have med in stock. Rx advsiement given.  Follow up 6 months or sooner if needed    [employer], PA-C

## 2022-08-27 NOTE — Patient Instructions (Addendum)
Chronic knee pain and some wrist pain. OA dx per orthopedist. Prior inflammatory labs negative. Rx low dose meloxicam to if needed. Also rx tramadol #15 tab 1 tab po q 6 hours prn severe pain. UDS today and update contract.  Htn- bp better controlled on recheck. Want you to check bp at home when relaxed about 3 time over next week.  For insomnia will rx trazadone. If you anxious feeling are worse consider buspar.  For obesity resent your wegovy to our pharmacy downstairs. Sometimes we have med in stock. Rx advsiement given.  Follow up 6 months or sooner if needed

## 2022-08-27 NOTE — Telephone Encounter (Signed)
PA denied. Plan exclusion.   The requested medication and/or diagnosis are not a covered benefit and are excluded from coverage in accordance with the terms and conditions of your plan benefit. Therefore, this request has been administratively denied

## 2022-08-27 NOTE — Telephone Encounter (Signed)
PA initiated via Covermymeds; KEY:  [id number]. Awaiting determination.

## 2022-08-30 ENCOUNTER — Ambulatory Visit: Payer: 59 | Admitting: Family Medicine

## 2022-08-31 LAB — DRUG MONITORING PANEL 376104, URINE
Amphetamines: NEGATIVE ng/mL (ref <500)
Barbiturates: NEGATIVE ng/mL (ref <300)
Benzodiazepines: NEGATIVE ng/mL (ref <100)
Cocaine Metabolite: NEGATIVE ng/mL (ref <150)
Desmethyltramadol: 1233 ng/mL — ABNORMAL HIGH (ref <100)
Opiates: NEGATIVE ng/mL (ref <100)
Oxycodone: NEGATIVE ng/mL (ref <100)
Tramadol: 1023 ng/mL — ABNORMAL HIGH (ref <100)

## 2022-08-31 LAB — DM TEMPLATE

## 2022-09-01 ENCOUNTER — Other Ambulatory Visit (HOSPITAL_BASED_OUTPATIENT_CLINIC_OR_DEPARTMENT_OTHER): Payer: Self-pay

## 2022-09-10 ENCOUNTER — Encounter: Payer: Self-pay | Admitting: Gastroenterology

## 2022-09-27 ENCOUNTER — Other Ambulatory Visit: Payer: Self-pay | Admitting: Medical

## 2022-11-01 ENCOUNTER — Encounter: Payer: Self-pay | Admitting: *Deleted

## 2022-11-02 ENCOUNTER — Ambulatory Visit: Payer: 59 | Admitting: Medical

## 2022-11-02 ENCOUNTER — Encounter: Payer: Self-pay | Admitting: Medical

## 2022-11-02 VITALS — BP 136/80 | HR 88 | Temp 98.1°F | Resp 18 | Ht 65.0 in | Wt 200.0 lb

## 2022-11-02 DIAGNOSIS — R1031 Right lower quadrant pain: Secondary | ICD-10-CM

## 2022-11-02 LAB — CBC WITH DIFFERENTIAL/PLATELET
Absolute Monocytes: 579 cells/uL (ref 200–950)
Basophils Absolute: 53 cells/uL (ref 0–200)
Basophils Relative: 0.6 %
Eosinophils Absolute: 151 cells/uL (ref 15–500)
Eosinophils Relative: 1.7 %
HCT: 45.2 % — ABNORMAL HIGH (ref 35.0–45.0)
Hemoglobin: 15.9 g/dL — ABNORMAL HIGH (ref 11.7–15.5)
Lymphs Abs: 4067 cells/uL — ABNORMAL HIGH (ref 850–3900)
MCH: 31.7 pg (ref 27.0–33.0)
MCHC: 35.2 g/dL (ref 32.0–36.0)
MCV: 90.2 fL (ref 80.0–100.0)
MPV: 9.6 fL (ref 7.5–12.5)
Monocytes Relative: 6.5 %
Neutro Abs: 4050 cells/uL (ref 1500–7800)
Neutrophils Relative %: 45.5 %
Platelets: 371 10*3/uL (ref 140–400)
RBC: 5.01 10*6/uL (ref 3.80–5.10)
RDW: 12.9 % (ref 11.0–15.0)
Total Lymphocyte: 45.7 %
WBC: 8.9 10*3/uL (ref 3.8–10.8)

## 2022-11-02 LAB — COMPREHENSIVE METABOLIC PANEL
AG Ratio: 1.6 (calc) (ref 1.0–2.5)
ALT: 27 U/L (ref 6–29)
AST: 22 U/L (ref 10–35)
Albumin: 4.5 g/dL (ref 3.6–5.1)
Alkaline phosphatase (APISO): 71 U/L (ref 37–153)
BUN: 8 mg/dL (ref 7–25)
CO2: 24 mmol/L (ref 20–32)
Calcium: 9.4 mg/dL (ref 8.6–10.4)
Chloride: 103 mmol/L (ref 98–110)
Creat: 0.76 mg/dL (ref 0.50–1.03)
Globulin: 2.8 g/dL (calc) (ref 1.9–3.7)
Glucose, Bld: 93 mg/dL (ref 65–99)
Potassium: 4 mmol/L (ref 3.5–5.3)
Sodium: 135 mmol/L (ref 135–146)
Total Bilirubin: 0.5 mg/dL (ref 0.2–1.2)
Total Protein: 7.3 g/dL (ref 6.1–8.1)

## 2022-11-02 LAB — LIPASE: Lipase: 28 U/L (ref 7–60)

## 2022-11-02 NOTE — Progress Notes (Signed)
Subjective:    Patient ID: Laurie Chang, female    DOB: 1965-11-28, 57 y.o.   MRN: [id number]  HPI  Pt in with 3 days of abdomen pain. She has decreased appetite. She feels bloated over past 3 days. She has been having loose stools about 5-6 times today. Other days 3-4. No fever. She has been getting chills off and on. She does feel tired. Most of pain in rt lower quadrant. Pt has level 4/10 ache pain. At times higher.  No vomting but some nausea.  Pt notes one time had diverticultis.  Review of Systems  Constitutional:  Negative for chills, fatigue and fever.  Respiratory:  Negative for cough, chest tightness, shortness of breath and wheezing.   Cardiovascular:  Negative for chest pain and palpitations.  Gastrointestinal:  Positive for abdominal distention, abdominal pain, diarrhea and nausea. Negative for blood in stool, constipation and vomiting.  Genitourinary:  Negative for dysuria, flank pain and hematuria.  Musculoskeletal:  Negative for back pain and joint swelling.  Skin:  Negative for rash.  Neurological:  Negative for dizziness.  Hematological:  Negative for adenopathy. Does not bruise/bleed easily.  Psychiatric/Behavioral:  Negative for behavioral problems and decreased concentration.     Past Medical History:  Diagnosis Date   Arthritis    Asthma    as a child    Hyperlipidemia    Hypertension    Leiomyoma uteri    Smoker    1/2 PPD   Syncope and collapse      Social History   Socioeconomic History   Marital status: Married    Spouse name: Not on file   Number of children: 3   Years of education: 12   Highest education level: Not on file  Occupational History   Occupation: Med Theme park manager: [employer]    Comment: [facility] [employer] [facility]  Tobacco Use   Smoking status: Former    Packs/day: 0.50    Years: 30.00    Additional pack years: 0.00    Total pack years: 15.00    Types: Cigarettes    Quit date: 2023    Years  since quitting: 1.2   Smokeless tobacco: Never  Vaping Use   Vaping Use: Every day  Substance and Sexual Activity   Alcohol use: Not Currently    Comment: once in a blue moon   Drug use: No   Sexual activity: Yes    Partners: Male    Birth control/protection: Surgical  Other Topics Concern   Not on file  Social History Narrative   Widowed in 2012.  Lives with her daughter and two granddaughters.   Social Determinants of Health   Financial Resource Strain: Not on file  Food Insecurity: Not on file  Transportation Needs: Not on file  Physical Activity: Not on file  Stress: Not on file  Social Connections: Not on file  Intimate Partner Violence: Not on file    Past Surgical History:  Procedure Laterality Date   ABDOMINAL HYSTERECTOMY  12/11/10   TAH,RSO   HYSTEROSCOPY  11/2000   HYSTEROSCOPIC MYOMECTOMY   LAPAROSCOPIC CHOLECYSTECTOMY     OOPHORECTOMY  12/11/10   TAH,RSO   TUBAL LIGATION     VEIN SURGERY      Family History  Problem Relation Age of Onset   Cancer Mother        LUNG   Hypertension Father    Heart disease Father    Hypertension Brother  Cancer Maternal Aunt 60       breast   Cancer Maternal Grandmother 1       breast   Breast cancer Maternal Grandmother    Colon cancer Neg Hx    Colon polyps Neg Hx    Esophageal cancer Neg Hx    Stomach cancer Neg Hx    Rectal cancer Neg Hx     No Known Allergies  Current Outpatient Medications on File Prior to Visit  Medication Sig Dispense Refill   atorvastatin (LIPITOR) 10 MG tablet Take 1 tablet by mouth once daily 90 tablet 1   losartan-hydrochlorothiazide (HYZAAR) 100-12.5 MG tablet Take 1 tablet by mouth daily. 90 tablet 3   meloxicam (MOBIC) 7.5 MG tablet Take 1 tablet (7.5 mg total) by mouth daily. 30 tablet 0   meloxicam (MOBIC) 7.5 MG tablet 1 tab po q day 30 tablet 0   Multiple Vitamins-Minerals (ONE-A-DAY WOMENS PO) Take by mouth.     Semaglutide-Weight Management 0.25 MG/0.5ML SOAJ Inject  0.25 mg into the skin once a week. 2 mL 1   [START ON 11/22/2022] Semaglutide-Weight Management 1.7 MG/0.75ML SOAJ Inject 1.7 mg into the skin once a week for 28 days. 3 mL 0   [START ON 12/21/2022] Semaglutide-Weight Management 2.4 MG/0.75ML SOAJ Inject 2.4 mg into the skin once a week for 28 days. 3 mL 0   traMADol (ULTRAM) 50 MG tablet Take 1 tablet (50 mg total) by mouth every 6 (six) hours as needed for severe pain. 30 tablet 0   traZODone (DESYREL) 50 MG tablet Take 0.5-1 tablets (25-50 mg total) by mouth at bedtime as needed for sleep. 30 tablet 3   No current facility-administered medications on file prior to visit.    BP 136/80   Pulse 88   Temp 98.1 F (36.7 C)   Resp 18   Ht  (1.651 m)   LMP 08/19/2010   SpO2 97%   BMI 33.45 kg/m        Objective:   Physical Exam  General- No acute distress. Pleasant patient. Neck- Full range of motion, no jvd Lungs- Clear, even and unlabored. Heart- regular rate and rhythm. Neurologic- CNII- XII grossly intact.  Back- no cva pain/tenderness. Abdomen- soft, non distended +bowel sounds. Rlq pain moderate to severe on palpation. No rebound. Heel jar moderate on exam. Some pain on rotation rt lower ext.       Assessment & Plan:   Patient Instructions  Rlq pain for 3 days. Moderate to severe. Considering appendicitis vs diverticulitis.   Hydrate well and bland foods.  Will get stat cbc, cmp and lipase today.  Ct abd pelvis with contrast by tomorrow am. But we need to get prior authorization first. Call our office tomorrow morning for update on prior authorization.  If you have severe abdomen pain/worse symptoms tonight then advise be seen in ED for ct imaging rather than waiting.  Follow up date to be determined after lab review.    Esperanza Richters, PA-C

## 2022-11-02 NOTE — Patient Instructions (Addendum)
Rlq pain for 3 days. Moderate to severe. Considering appendicitis vs diverticulitis.   Hydrate well and bland foods.  Will get stat cbc, cmp and lipase today.  Ct abd pelvis with contrast by tomorrow am. But we need to get prior authorization first. Call our office tomorrow morning for update on prior authorization and scheduling  If you have severe abdomen pain/worse symptoms tonight then advise be seen in ED for ct imaging rather than waiting.  Follow up date to be determined after lab review.

## 2022-11-02 NOTE — Addendum Note (Signed)
Addended by: Gwenevere Abbot on: 11/02/2022 05:21 PM   Modules accepted: Orders

## 2022-11-03 ENCOUNTER — Telehealth: Payer: Self-pay | Admitting: Medical

## 2022-11-03 ENCOUNTER — Ambulatory Visit (HOSPITAL_BASED_OUTPATIENT_CLINIC_OR_DEPARTMENT_OTHER)
Admission: RE | Admit: 2022-11-03 | Discharge: 2022-11-03 | Disposition: A | Discharge status: Home / Self Care | Payer: 59 | Source: Ambulatory Visit | Attending: Medical | Admitting: Medical

## 2022-11-03 ENCOUNTER — Encounter (HOSPITAL_BASED_OUTPATIENT_CLINIC_OR_DEPARTMENT_OTHER): Payer: Self-pay

## 2022-11-03 ENCOUNTER — Encounter: Payer: Self-pay | Admitting: Medical

## 2022-11-03 DIAGNOSIS — R1031 Right lower quadrant pain: Secondary | ICD-10-CM | POA: Insufficient documentation

## 2022-11-03 MED ORDER — IOHEXOL 300 MG/ML  SOLN
100.0000 mL | Freq: Once | INTRAMUSCULAR | Status: AC | PRN
  Administered 2022-11-03: 100 mL via INTRAVENOUS

## 2022-11-03 NOTE — Telephone Encounter (Signed)
Misty with [facility] called to speak with referral coordinator. Please call back [phone] ext [phone]

## 2022-11-12 ENCOUNTER — Encounter: Payer: Self-pay | Admitting: Gastroenterology

## 2022-12-23 ENCOUNTER — Telehealth: Payer: Self-pay

## 2022-12-23 ENCOUNTER — Ambulatory Visit: Payer: Self-pay | Admitting: Family

## 2022-12-23 DIAGNOSIS — R000 Tachycardia, unspecified: Secondary | ICD-10-CM | POA: Diagnosis not present

## 2022-12-23 DIAGNOSIS — I493 Ventricular premature depolarization: Secondary | ICD-10-CM | POA: Diagnosis not present

## 2022-12-23 DIAGNOSIS — R079 Chest pain, unspecified: Secondary | ICD-10-CM | POA: Diagnosis not present

## 2022-12-23 DIAGNOSIS — R002 Palpitations: Secondary | ICD-10-CM | POA: Diagnosis not present

## 2022-12-23 NOTE — Telephone Encounter (Signed)
Initial Comment Caller states she is anxious, heart racing, HR 130, BP 166/90. Additional Comment transferred from office. Translation No Nurse Assessment Nurse: Caryn Bee, RN, Ayrine Date/Time (Eastern Time): 12/23/2022 8:19:27 AM Confirm and document reason for call. If symptomatic, describe symptoms. ---Caller states she feels like her heart is racing and her head feels cloudy. Pt states her symptoms began yesterday night. Patient states she checked her pulse an hour ago and pulse is 130 bpm. Does the patient have any new or worsening symptoms? ---Yes Will a triage be completed? ---Yes Related visit to physician within the last 2 weeks? ---No Does the PT have any chronic conditions? (i.e. diabetes, asthma, this includes High risk factors for pregnancy, etc.) ---No Is this a behavioral health or substance abuse call? ---No Guidelines Guideline Title Affirmed Question Affirmed Notes Nurse Date/Time Lamount Cohen Time) Chest Pain [1] Chest pain lasts > 5 minutes AND [2] age > 28 Jurovschi, RN, Ayrine 12/23/2022 8:22:37 AM Disp. Time Lamount Cohen Time) Disposition Final User 12/23/2022 8:27:54 AM Call EMS 911 Now Yes Jurovschi, RN, Ayrine 12/23/2022 8:36:20 AM 911 Outcome Documentation Jurovschi, RN, Ayrine PLEASE NOTE: All timestamps contained within this report are represented as [place] Standard Time. CONFIDENTIALTY NOTICE: This fax transmission is intended only for the addressee. It contains information that is legally privileged, confidential or otherwise protected from use or disclosure. If you are not the intended recipient, you are strictly prohibited from reviewing, disclosing, copying using or disseminating any of this information or taking any action in reliance on or regarding this information. If you have received this fax in error, please notify us immediately by telephone so that we can arrange for its return to [place]. Phone: [phone], Toll-Free: [phone], Fax:  [phone] Page: 2 of 2 Call Id: [id number] Disp. Time Lamount Cohen Time) Disposition Final User Reason: Spoke to caller at 5 minute call back for 911 disposition. Patient states she is on her way to be seen. Final Disposition 12/23/2022 8:27:54 AM Call EMS 911 Now Yes Jurovschi, RN, Ayrine Caller Disagree/Comply Disagree Caller Understands Yes PreDisposition InappropriateToAsk Care Advice Given Per Guideline CALL EMS 911 NOW: * Immediate medical attention is needed. You need to hang up and call 911 (or an ambulance). CARE ADVICE given per Chest Pain (Adult) guideline. Comments User: Mickle Mallory, RN Date/Time Lamount Cohen Time): 12/23/2022 8:27:41 AM Patient disagrees with calling 911 and would rather have someone drive her to an urgent care. Advised patient that calling 911 is recommended for immediate medical attention

## 2022-12-23 NOTE — Telephone Encounter (Signed)
Pt at ED at [facility]

## 2022-12-24 ENCOUNTER — Other Ambulatory Visit: Payer: Self-pay | Admitting: Medical

## 2022-12-27 ENCOUNTER — Encounter: Payer: Self-pay | Admitting: Physician Assistant

## 2022-12-29 ENCOUNTER — Encounter: Payer: Self-pay | Admitting: Gastroenterology

## 2022-12-29 ENCOUNTER — Ambulatory Visit: Payer: BC Managed Care – PPO | Admitting: Medical

## 2022-12-29 ENCOUNTER — Ambulatory Visit (AMBULATORY_SURGERY_CENTER): Payer: BC Managed Care – PPO | Admitting: *Deleted

## 2022-12-29 VITALS — Ht 65.0 in | Wt 196.0 lb

## 2022-12-29 DIAGNOSIS — Z86010 Personal history of colonic polyps: Secondary | ICD-10-CM

## 2022-12-29 MED ORDER — NA SULFATE-K SULFATE-MG SULF 17.5-3.13-1.6 GM/177ML PO SOLN
1.0000 | Freq: Once | ORAL | 0 refills | Status: AC
Start: 2022-12-29 — End: 2022-12-29

## 2022-12-29 NOTE — Progress Notes (Signed)
Pt's name and DOB verified at the beginning of the pre-visit.  Pt denies any difficulty with ambulating,sitting, laying down or rolling side to side Gave both [facility] main # and MD on call # prior to instructions.  No egg or soy allergy known to patient  No issues known to pt with past sedation with any surgeries or procedures Pt denies having issues being intubated Pt has no issues moving head neck or swallowing No FH of Malignant Hyperthermia Pt is not on diet pills Pt is not on home 02  Pt is not on blood thinners  Pt denies issues with constipation  Pt is not on dialysis Pt denise any abnormal heart rhythms  Pt denies any upcoming cardiac testing Pt encouraged to use to use Singlecare or Goodrx to reduce cost  Patient's chart reviewed by John Nulty CNRA prior to pre-visit and patient appropriate for the [facility].  Pre-visit completed and red dot placed by patient's name on their procedure day (on provider's schedule).  . Visit by phone Pt states weight is 195 lb Instructed pt why it is important to and  to call if they have any changes in health or new medications. Directed them to the # given and on instructions.   Pt states they will.  Instructions reviewed with pt and pt states understanding. Instructed to review again prior to procedure. Pt states they will.  Instructions sent by mail with coupon and by my chart   

## 2023-01-03 ENCOUNTER — Other Ambulatory Visit: Payer: Self-pay | Admitting: Medical

## 2023-01-03 ENCOUNTER — Other Ambulatory Visit (HOSPITAL_BASED_OUTPATIENT_CLINIC_OR_DEPARTMENT_OTHER): Payer: Self-pay

## 2023-01-03 ENCOUNTER — Ambulatory Visit: Payer: BC Managed Care – PPO | Admitting: Medical

## 2023-01-03 ENCOUNTER — Telehealth: Payer: Self-pay

## 2023-01-03 ENCOUNTER — Encounter: Payer: Self-pay | Admitting: Medical

## 2023-01-03 VITALS — BP 135/84 | HR 100 | Temp 98.0°F | Resp 18 | Ht 65.0 in | Wt 202.0 lb

## 2023-01-03 DIAGNOSIS — I10 Essential (primary) hypertension: Secondary | ICD-10-CM | POA: Diagnosis not present

## 2023-01-03 DIAGNOSIS — Z87891 Personal history of nicotine dependence: Secondary | ICD-10-CM | POA: Diagnosis not present

## 2023-01-03 DIAGNOSIS — Z1231 Encounter for screening mammogram for malignant neoplasm of breast: Secondary | ICD-10-CM

## 2023-01-03 DIAGNOSIS — E669 Obesity, unspecified: Secondary | ICD-10-CM | POA: Diagnosis not present

## 2023-01-03 DIAGNOSIS — F419 Anxiety disorder, unspecified: Secondary | ICD-10-CM | POA: Diagnosis not present

## 2023-01-03 DIAGNOSIS — R002 Palpitations: Secondary | ICD-10-CM

## 2023-01-03 LAB — COMPREHENSIVE METABOLIC PANEL
ALT: 27 U/L (ref 0–35)
AST: 20 U/L (ref 0–37)
Albumin: 4.2 g/dL (ref 3.5–5.2)
Alkaline Phosphatase: 69 U/L (ref 39–117)
BUN: 12 mg/dL (ref 6–23)
CO2: 23 mEq/L (ref 19–32)
Calcium: 9.3 mg/dL (ref 8.4–10.5)
Chloride: 106 mEq/L (ref 96–112)
Creatinine, Ser: 0.84 mg/dL (ref 0.40–1.20)
GFR: 76.32 mL/min (ref >60.00)
Glucose, Bld: 101 mg/dL — ABNORMAL HIGH (ref 70–99)
Potassium: 4.1 mEq/L (ref 3.5–5.1)
Sodium: 137 mEq/L (ref 135–145)
Total Bilirubin: 0.4 mg/dL (ref 0.2–1.2)
Total Protein: 7.3 g/dL (ref 6.0–8.3)

## 2023-01-03 LAB — MAGNESIUM: Magnesium: 1.8 mg/dL (ref 1.5–2.5)

## 2023-01-03 MED ORDER — WEGOVY 0.25 MG/0.5ML ~~LOC~~ SOAJ
0.2500 mg | SUBCUTANEOUS | 0 refills | Status: DC
  Filled 2023-01-03: qty 2, 28d supply, fill #0

## 2023-01-03 MED ORDER — SERTRALINE HCL 25 MG PO TABS: 25.0000 mg | ORAL_TABLET | Freq: Every day | ORAL | 0 refills | Status: DC

## 2023-01-03 NOTE — Telephone Encounter (Signed)
Unable to find member.

## 2023-01-03 NOTE — Telephone Encounter (Signed)
Noted, thank you

## 2023-01-03 NOTE — Patient Instructions (Addendum)
1. Obesity (BMI 30.0-34.9) -Wegovy rx. Rx advisment given  2. Essential hypertension Bp controlled on recheck.  - Comp Met (CMET)  3. Anxiety Sertraline rx. Rx advisement given. On review rare use tramadol for pain and not using trazadone. So low risk serotonin syndrome. Rx advisement. Educatoin sheet provided.  4. History of smoking 10-25 pack years Continue to taper off smoking.  5. Palpitations - pvc on EKG.  But when you had pvc did not feel on monitor. Avoid caffeine. Refer to cardiology for possible ziopatch  6. Hypomagnesemia Recheck level. - Comp Met (CMET)  -mg level.  Follow up in one month or sooner if needed.  Serotonin Syndrome Serotonin is a chemical that helps to control several functions in the body. This chemical is also called a neurotransmitter. It controls: Brain and nerve cell function. Mood and emotions. Memory. Eating. Sleeping. Sexual activity. Stress response. Having too much serotonin in your body can cause serotonin syndrome. This condition can be harmful to your brain and nerve cells. This can be a life-threatening condition. What are the causes? This condition may be caused by taking medicines or drugs that increase the level of serotonin in your body, such as: Antidepressant medicines. Migraine medicines. Certain pain medicines. Certain drugs, including ecstasy, LSD, cocaine, and amphetamines. Over-the-counter cough or cold medicines that contain dextromethorphan. Certain herbal supplements, including St. John's wort, ginseng, and nutmeg. This condition usually occurs when you take these medicines or drugs together, but it can also happen with a high dose of a single medicine or drug. What increases the risk? You are more likely to develop this condition if: You just started taking a medicine or drug that increases the level of serotonin in the body. You recently increased the dose of a medicine or drug that increases the level of serotonin  in the body. You take more than one medicine or drug that increases the level of serotonin in the body. What are the signs or symptoms? Symptoms of this condition usually start within several hours of taking a medicine or drug. Symptoms may be mild or severe. Mild symptoms include: Sweating. Restlessness or agitation. Muscle twitching or stiffness. Rapid heart rate. Nausea, vomiting, or diarrhea. Shivering or goose bumps. Confusion. Severe symptoms include: Irregular heartbeat. Seizures. Loss of consciousness. High fever. How is this diagnosed? This condition may be diagnosed based on: Your medical history. A physical exam. Your prior use of drugs and medicines. Blood or urine tests. These may be used to rule out other causes of your symptoms. How is this treated? The treatment for this condition depends on the severity of your symptoms. For mild cases, stopping the medicine or drug that caused your condition is usually all that is needed. For moderate to severe cases, treatment in a hospital may be needed to prevent or treat life-threatening symptoms. Treatment may include: Medicines to control your symptoms. IV fluids. Actions to support your breathing. Treatments to control your body temperature. Follow these instructions at home: Medicines  Take over-the-counter and prescription medicines only as told by your health care provider. Check with your health care provider before you start taking any new prescriptions, over-the-counter medicines, herbs, or supplements. Do not combine any medicines that can cause this condition. Lifestyle  Maintain a healthy lifestyle. Eat a healthy diet that includes plenty of vegetables, fruits, whole grains, low-fat dairy products, and lean protein. Do not eat a lot of foods that are high in fat, added sugars, or salt. Get the right amount and quality  of sleep. Most adults need 7-9 hours of sleep each night. Make time to exercise, even if it  is only for short periods of time. Most adults should exercise for at least 150 minutes each week. Do not drink alcohol. Do not use illegal drugs. Do not take medicines for reasons other than they are prescribed. General instructions Do not use any products that contain nicotine or tobacco. These products include cigarettes, chewing tobacco, and vaping devices, such as e-cigarettes. If you need help quitting, ask your health care provider. Contact a health care provider if: Your symptoms do not improve or they get worse. Get help right away if: You have worsening confusion, severe headache, chest pain, high fever, seizures, or loss of consciousness. You experience serious side effects of medicine, such as swelling of your face, lips, tongue, or throat. These symptoms may be an emergency. Get help right away. Call 911. Do not wait to see if the symptoms will go away. Do not drive yourself to the hospital. Also, get help right away if: You have serious thoughts about hurting yourself or others. Take one of these steps if you feel like you may hurt yourself or others, or have thoughts about taking your own life: Go to your nearest emergency room. Call 911. Call the National Suicide Prevention Lifeline at 620 698 8438 or 988. This is open 24 hours a day. Text the Crisis Text Line at (270)595-2307. Summary Serotonin is a chemical that helps to control several functions in the body. High levels of serotonin in the body can cause serotonin syndrome, which can be life-threatening. This condition may be caused by taking medicines or drugs that increase the level of serotonin in your body. Treatment depends on the severity of your symptoms. For mild cases, stopping the medicine or drug that caused your condition is usually all that is needed. Check with your health care provider before you start taking any new prescriptions, over-the-counter medicines, herbs, or supplements. This information is not  intended to replace advice given to you by your health care provider. Make sure you discuss any questions you have with your health care provider. Document Revised: 09/24/2021 Document Reviewed: 09/24/2021 Elsevier Patient Education  2024 [employer].

## 2023-01-03 NOTE — Telephone Encounter (Signed)
Pharmacy Patient Advocate Encounter   Received notification that prior authorization for [facility] 0.25MG /0.5ML auto-injectors is required/requested.   PA submitted to [facility] via CoverMyMeds Key or (Medicaid) confirmation # [id number] Status is pending

## 2023-01-03 NOTE — Addendum Note (Signed)
Addended by: Maximino Sarin on: 01/03/2023 09:06 AM   Modules accepted: Orders

## 2023-01-03 NOTE — Telephone Encounter (Signed)
PA initiated via Covermymeds; KEY: [id number]. Awaiting determination.

## 2023-01-03 NOTE — Progress Notes (Signed)
Subjective:    Patient ID: Laurie Chang, female    DOB: August 20, 1965, 57 y.o.   MRN: [id number]  HPI  Pt in for follow up for 2 days of some lower chest discomfort prior to ED evaluation. She states more feel heart fluttering/palpitation when stressed.  Pt thinks stress anxiety regardning parent her adult child, when having stress at work or when in car with her husband who pt thinks does not drive safely at times.  ED clinical impression. ED Clinical Impression   1. Palpitations  2. Frequent PVCs   ED Assessment/Plan  Plan: Patient discharged home after ED evaluation of her palpitation symptoms. She was having PVCs on cardiac monitor and on her EKG she also had PVCs. Lab work showed a mildly decreased magnesium for which she received supplementation. She was then discharged home to follow-up with her primary care provider with a plan that if her symptoms of palpitations did not improve she would consider cardiac monitoring as an outpatient along with echocardiogram as an outpatient.    Pt states in the ED when she was having pvc on monitor she did not describe feeling palpitation.  Pt states she attributes her heart symptoms to anxiety. Feeling anxious for about 3 months.  Phq-9 score 11. Gad-7 17.    Pt has been decreasing her smoking. Now just smoking 4 cigarettes a day. Was smoking 1/2 pack a day.   Htn- pt is on losartan 100-12.5 mg daily.  Obesity- bmi of 33. Reviewed contraindications and has non. She wants to try wegovy. Prior rx but not covered by her insurance.   Review of Systems  Constitutional:  Negative for chills, fatigue and fever.  Respiratory:  Negative for cough, chest tightness, shortness of breath and wheezing.   Cardiovascular:  Positive for palpitations. Negative for chest pain.       Non presently.  Gastrointestinal:  Negative for abdominal pain, constipation and nausea.  Genitourinary:  Negative for dyspareunia, dysuria, frequency, pelvic pain and  urgency.  Musculoskeletal:  Negative for back pain and joint swelling.  Skin:  Negative for rash.  Neurological:  Negative for dizziness, light-headedness, numbness and headaches.  Psychiatric/Behavioral:  Negative for dysphoric mood, sleep disturbance and suicidal ideas. The patient is nervous/anxious.            Objective:   Physical Exam  General Mental Status- Alert. General Appearance- Not in acute distress.   Skin General: Color- Normal Color. Moisture- Normal Moisture.  Neck Carotid Arteries- Normal color. Moisture- Normal Moisture. No carotid bruits. No JVD.  Chest and Lung Exam Auscultation: Breath Sounds:-Normal.  Cardiovascular Auscultation:Rythm- Regular. Murmurs & Other Heart Sounds:Auscultation of the heart reveals- No Murmurs.  Abdomen Inspection:-Inspeection Normal. Palpation/Percussion:Note:No mass. Palpation and Percussion of the abdomen reveal- Non Tender, Non Distended + BS, no rebound or guarding.    Neurologic Cranial Nerve exam:- CN III-XII intact(No nystagmus), symmetric smile. Strength:- 5/5 equal and symmetric strength both upper and lower extremities.       Assessment & Plan:   Patient Instructions  1. Obesity (BMI 30.0-34.9) -Wegovy rx. Rx advisment given  2. Essential hypertension Bp controlled on recheck.  - Comp Met (CMET)  3. Anxiety Sertraline rx. Rx advisement given. On review rare use tramadol for pain and not using trazadone. So low risk serotonin syndrome. Rx advisement. Educatoin sheet provided.  4. History of smoking 10-25 pack years Continue to taper off smoking.  5. Palpitations - pvc on EKG.  But when you had pvc did not  feel on monitor. Avoid caffeine. Refer to cardiology for possible ziopatch  6. Hypomagnesemia Recheck level. - Comp Met (CMET)  -mg level.   Follow up one month or sooner if needed.  Esperanza Richters, PA-C

## 2023-01-03 NOTE — Telephone Encounter (Signed)
PA submitted via CMM. Created new encounter for PA. Will route back to pool once determination has been made. 

## 2023-01-04 ENCOUNTER — Other Ambulatory Visit (HOSPITAL_BASED_OUTPATIENT_CLINIC_OR_DEPARTMENT_OTHER): Payer: Self-pay

## 2023-01-04 NOTE — Telephone Encounter (Signed)
PA denied.   Denied. This health benefit plan does not cover the following services, supplies, drugs or charges: Any treatment or regimen, medical or surgical, for the purpose of reducing or controlling the weight of the member, or for the treatment of obesity, except for surgical treatment of morbid obesity, or as specifically covered by this health benefit plan. 

## 2023-01-05 ENCOUNTER — Other Ambulatory Visit (HOSPITAL_BASED_OUTPATIENT_CLINIC_OR_DEPARTMENT_OTHER): Payer: Self-pay

## 2023-01-05 NOTE — Telephone Encounter (Signed)
Pt agrees to try metformin

## 2023-01-18 ENCOUNTER — Encounter: Payer: 59 | Admitting: Gastroenterology

## 2023-02-02 ENCOUNTER — Ambulatory Visit: Payer: BC Managed Care – PPO | Admitting: Medical

## 2023-02-02 VITALS — BP 118/70 | HR 96 | Resp 18 | Ht 65.0 in | Wt 203.0 lb

## 2023-02-02 DIAGNOSIS — E669 Obesity, unspecified: Secondary | ICD-10-CM | POA: Diagnosis not present

## 2023-02-02 DIAGNOSIS — R002 Palpitations: Secondary | ICD-10-CM

## 2023-02-02 DIAGNOSIS — I10 Essential (primary) hypertension: Secondary | ICD-10-CM

## 2023-02-02 DIAGNOSIS — F419 Anxiety disorder, unspecified: Secondary | ICD-10-CM

## 2023-02-02 MED ORDER — SERTRALINE HCL 25 MG PO TABS: 25.0000 mg | ORAL_TABLET | Freq: Every day | ORAL | 3 refills | Status: DC

## 2023-02-02 MED ORDER — METFORMIN HCL 500 MG PO TABS: 500.0000 mg | ORAL_TABLET | Freq: Two times a day (BID) | ORAL | 3 refills | Status: DC

## 2023-02-02 NOTE — Patient Instructions (Signed)
Anxiety: Improved with Sertraline 25mg  daily. No current palpitations or overwhelming feelings. -Continue Sertraline 25mg  daily.  Premature Ventricular Contractions (PVCs): History of PVCs on previous EKG. Currently asymptomatic. Pending cardiology evaluation. -Consider reducing caffeine intake to further minimize PVCs.  Obesity: Despite dietary changes and regular exercise, weight continues to increase. Previous attempt to prescribe Wegovy unsuccessful due to insurance coverage. -Prescribe Metformin 500mg , starting with once daily for two weeks despite prescription instructions for twice daily. Monitor for potential side effects including loss of appetite, bloating, and diarrhea.  Hypertension: Well controlled on Losartan HCTZ.  -Continue Losartan HCTZ.  Hyperlipidemia: Discontinued Atorvastatin 10mg . -Discuss potential re-initiation or alternative lipid-lowering therapy at next visit.  Follow-up in two months with the goal of observing weight loss.

## 2023-02-02 NOTE — Progress Notes (Signed)
   Subjective:    Patient ID: Laurie Chang, female    DOB: [birth year], 57 y.o.   MRN: [id number]  HPI Discussed the use of AI scribe software for clinical note transcription with the patient, who gave verbal consent to proceed.  History of Present Illness   The patient, previously diagnosed with anxiety, has been on Sertraline 25mg  with significant improvement in symptoms. She reports that situations that would typically induce anxiety no longer do so. She also has a history of PVCs, which may have contributed to her anxiety. The patient consumes two cups of coffee daily and is considering switching to decaf to potentially reduce PVC occurrences.. No recent palpitations.   Despite efforts to maintain a healthy lifestyle, including cutting out sugars and exercising three to four times a week, the patient reports weight gain. She has tried to manage her obesity with Wegovy, but insurance did not cover it. She was also prescribed Metformin, but she has not yet started this medication.  The patient's blood pressure has been well controlled on Losartan HCTZ. She also has a history of high cholesterol, for which she discontinued Atorvastatin 10mg .        Review of Systems See hpi.    Objective:   Physical Exam  General Mental Status- Alert. General Appearance- Not in acute distress.   Skin General: Color- Normal Color. Moisture- Normal Moisture.  Neck Carotid Arteries- Normal color. Moisture- Normal Moisture. No carotid bruits. No JVD.  Chest and Lung Exam Auscultation: Breath Sounds:-Normal.  Cardiovascular Auscultation:Rythm- Regular. Murmurs & Other Heart Sounds:Auscultation of the heart reveals- No Murmurs.  Abdomen Inspection:-Inspeection Normal. Palpation/Percussion:Note:No mass. Palpation and Percussion of the abdomen reveal- Non Tender, Non Distended + BS, no rebound or guarding.    Neurologic Cranial Nerve exam:- CN III-XII intact(No nystagmus), symmetric  smile. Strength:- 5/5 equal and symmetric strength both upper and lower extremities.       Assessment & Plan:  Assessment and Plan    Anxiety: Improved with Sertraline 25mg  daily. No current palpitations or overwhelming feelings. -Continue Sertraline 25mg  daily.  Premature Ventricular Contractions (PVCs): History of PVCs on previous EKG. Currently asymptomatic. Pending cardiology evaluation. -Consider reducing caffeine intake to further minimize PVCs.  Obesity: Despite dietary changes and regular exercise, weight continues to increase. Previous attempt to prescribe Wegovy unsuccessful due to insurance coverage. -Prescribe Metformin 500mg , starting with once daily for two weeks despite prescription instructions for twice daily. Monitor for potential side effects including loss of appetite, bloating, and diarrhea.  Hypertension: Well controlled on Losartan HCTZ.  -Continue Losartan HCTZ.  Hyperlipidemia: Discontinued Atorvastatin 10mg . -Discuss potential re-initiation or alternative lipid-lowering therapy at next visit.  Follow-up in two months with the goal of observing weight loss.       Esperanza Richters, PA-C

## 2023-02-03 ENCOUNTER — Other Ambulatory Visit: Payer: Self-pay | Admitting: Medical

## 2023-02-03 DIAGNOSIS — M545 Low back pain, unspecified: Secondary | ICD-10-CM

## 2023-02-22 ENCOUNTER — Encounter: Payer: Self-pay | Admitting: Gastroenterology

## 2023-03-01 ENCOUNTER — Encounter: Payer: 59 | Admitting: Gastroenterology

## 2023-03-03 ENCOUNTER — Encounter (INDEPENDENT_AMBULATORY_CARE_PROVIDER_SITE_OTHER): Payer: Self-pay

## 2023-03-16 ENCOUNTER — Ambulatory Visit: Payer: BC Managed Care – PPO | Admitting: Cardiology

## 2023-03-16 ENCOUNTER — Ambulatory Visit
Admission: RE | Admit: 2023-03-16 | Discharge: 2023-03-16 | Disposition: A | Discharge status: Home / Self Care | Payer: BC Managed Care – PPO | Source: Ambulatory Visit | Attending: Medical | Admitting: Medical

## 2023-03-16 DIAGNOSIS — Z1231 Encounter for screening mammogram for malignant neoplasm of breast: Secondary | ICD-10-CM

## 2023-03-17 ENCOUNTER — Other Ambulatory Visit: Payer: Self-pay | Admitting: Medical

## 2023-03-17 DIAGNOSIS — M545 Low back pain, unspecified: Secondary | ICD-10-CM

## 2023-03-17 NOTE — Telephone Encounter (Signed)
Requesting: tramadol Contract:09/16/22 UDS:09/16/22 Last Visit:02/02/23 Next Visit:04/14/23 Last Refill:07/16/22  Please Advise

## 2023-03-21 ENCOUNTER — Other Ambulatory Visit: Payer: Self-pay | Admitting: Medical

## 2023-03-28 ENCOUNTER — Telehealth: Payer: Self-pay

## 2023-03-28 NOTE — Telephone Encounter (Signed)
Please advise on wether patient is ok to proceed as scheduled.   Pt was seen in the ED in June for palpitations. Determined to have PVCs at that time. See full report 12/21/22.   Referred to cardiologist by PCP her apt is not until 10/31 is she ok to proceed or does she need to have this visit first.   Thank you, Previsit

## 2023-04-08 ENCOUNTER — Ambulatory Visit (AMBULATORY_SURGERY_CENTER): Payer: BC Managed Care – PPO

## 2023-04-08 VITALS — Ht 65.0 in | Wt 195.0 lb

## 2023-04-08 DIAGNOSIS — Z86010 Personal history of colonic polyps: Secondary | ICD-10-CM

## 2023-04-08 MED ORDER — NA SULFATE-K SULFATE-MG SULF 17.5-3.13-1.6 GM/177ML PO SOLN
1.0000 | Freq: Once | ORAL | 0 refills | Status: AC
Start: 2023-04-08 — End: 2023-04-08

## 2023-04-08 NOTE — Progress Notes (Signed)

## 2023-04-14 ENCOUNTER — Encounter: Payer: Self-pay | Admitting: Medical

## 2023-04-14 ENCOUNTER — Ambulatory Visit: Payer: BC Managed Care – PPO | Admitting: Medical

## 2023-04-14 VITALS — BP 122/88 | HR 80 | Temp 98.6°F | Resp 18 | Ht 65.0 in | Wt 200.2 lb

## 2023-04-14 DIAGNOSIS — Z79899 Other long term (current) drug therapy: Secondary | ICD-10-CM | POA: Diagnosis not present

## 2023-04-14 DIAGNOSIS — Z23 Encounter for immunization: Secondary | ICD-10-CM

## 2023-04-14 DIAGNOSIS — M25562 Pain in left knee: Secondary | ICD-10-CM

## 2023-04-14 DIAGNOSIS — D751 Secondary polycythemia: Secondary | ICD-10-CM

## 2023-04-14 DIAGNOSIS — Z0000 Encounter for general adult medical examination without abnormal findings: Secondary | ICD-10-CM

## 2023-04-14 DIAGNOSIS — M25561 Pain in right knee: Secondary | ICD-10-CM | POA: Diagnosis not present

## 2023-04-14 DIAGNOSIS — R739 Hyperglycemia, unspecified: Secondary | ICD-10-CM | POA: Diagnosis not present

## 2023-04-14 DIAGNOSIS — G8929 Other chronic pain: Secondary | ICD-10-CM

## 2023-04-14 DIAGNOSIS — Z0001 Encounter for general adult medical examination with abnormal findings: Secondary | ICD-10-CM | POA: Diagnosis not present

## 2023-04-14 LAB — LIPID PANEL
Cholesterol: 143 mg/dL (ref 0–200)
HDL: 43.5 mg/dL (ref >39.00)
LDL Cholesterol: 73 mg/dL (ref 0–99)
NonHDL: 99.69
Total CHOL/HDL Ratio: 3
Triglycerides: 132 mg/dL (ref 0.0–149.0)
VLDL: 26.4 mg/dL (ref 0.0–40.0)

## 2023-04-14 LAB — COMPREHENSIVE METABOLIC PANEL
ALT: 25 U/L (ref 0–35)
AST: 18 U/L (ref 0–37)
Albumin: 4.3 g/dL (ref 3.5–5.2)
Alkaline Phosphatase: 72 U/L (ref 39–117)
BUN: 10 mg/dL (ref 6–23)
CO2: 24 mEq/L (ref 19–32)
Calcium: 9.5 mg/dL (ref 8.4–10.5)
Chloride: 104 mEq/L (ref 96–112)
Creatinine, Ser: 0.78 mg/dL (ref 0.40–1.20)
GFR: 83.26 mL/min (ref >60.00)
Glucose, Bld: 89 mg/dL (ref 70–99)
Potassium: 4.4 mEq/L (ref 3.5–5.1)
Sodium: 136 mEq/L (ref 135–145)
Total Bilirubin: 0.5 mg/dL (ref 0.2–1.2)
Total Protein: 7.2 g/dL (ref 6.0–8.3)

## 2023-04-14 LAB — CBC WITH DIFFERENTIAL/PLATELET
Basophils Absolute: 0 10*3/uL (ref 0.0–0.1)
Basophils Relative: 0.3 % (ref 0.0–3.0)
Eosinophils Absolute: 0.1 10*3/uL (ref 0.0–0.7)
Eosinophils Relative: 1 % (ref 0.0–5.0)
HCT: 46.8 % — ABNORMAL HIGH (ref 36.0–46.0)
Hemoglobin: 15.8 g/dL — ABNORMAL HIGH (ref 12.0–15.0)
Lymphocytes Relative: 26.9 % (ref 12.0–46.0)
Lymphs Abs: 2.7 10*3/uL (ref 0.7–4.0)
MCHC: 33.8 g/dL (ref 30.0–36.0)
MCV: 92 fl (ref 78.0–100.0)
Monocytes Absolute: 0.6 10*3/uL (ref 0.1–1.0)
Monocytes Relative: 5.9 % (ref 3.0–12.0)
Neutro Abs: 6.7 10*3/uL (ref 1.4–7.7)
Neutrophils Relative %: 65.9 % (ref 43.0–77.0)
Platelets: 367 10*3/uL (ref 150.0–400.0)
RBC: 5.08 Mil/uL (ref 3.87–5.11)
RDW: 12.6 % (ref 11.5–15.5)
WBC: 10.2 10*3/uL (ref 4.0–10.5)

## 2023-04-14 LAB — HEMOGLOBIN A1C: Hgb A1c MFr Bld: 6.2 % (ref 4.6–6.5)

## 2023-04-14 MED ORDER — MELOXICAM 7.5 MG PO TABS: 7.5000 mg | ORAL_TABLET | Freq: Every day | ORAL | 0 refills | Status: DC

## 2023-04-14 NOTE — Patient Instructions (Addendum)
For you wellness exam today I have ordered cbc, cmp, A1c and lipid panel.  Flu vaccine today.  Mammogram up to date and colonoscopy pending.  Recommend exercise and healthy diet.  We will let you know lab results as they come in.  Follow up early march controlled med visit/routine follow up med problems or sooner if needed  Chronic knee pain. Up to date on contract and uds. Continue meloxicam and tramadol. Rx advisement given.  Both side osteoarthritis. Htn-  controlled. pt is on losartan 100-12.5 mg daily.  Anxiety- controlled with sertraline.   Preventive Care 59-50 Years Old, Female Preventive care refers to lifestyle choices and visits with your health care provider that can promote health and wellness. Preventive care visits are also called wellness exams. What can I expect for my preventive care visit? Counseling Your health care provider may ask you questions about your: Medical history, including: Past medical problems. Family medical history. Pregnancy history. Current health, including: Menstrual cycle. Method of birth control. Emotional well-being. Home life and relationship well-being. Sexual activity and sexual health. Lifestyle, including: Alcohol, nicotine or tobacco, and drug use. Access to firearms. Diet, exercise, and sleep habits. Work and work Astronomer. Sunscreen use. Safety issues such as seatbelt and bike helmet use. Physical exam Your health care provider will check your: Height and weight. These may be used to calculate your BMI (body mass index). BMI is a measurement that tells if you are at a healthy weight. Waist circumference. This measures the distance around your waistline. This measurement also tells if you are at a healthy weight and may help predict your risk of certain diseases, such as type 2 diabetes and high blood pressure. Heart rate and blood pressure. Body temperature. Skin for abnormal spots. What immunizations do I  need?  Vaccines are usually given at various ages, according to a schedule. Your health care provider will recommend vaccines for you based on your age, medical history, and lifestyle or other factors, such as travel or where you work. What tests do I need? Screening Your health care provider may recommend screening tests for certain conditions. This may include: Lipid and cholesterol levels. Diabetes screening. This is done by checking your blood sugar (glucose) after you have not eaten for a while (fasting). Pelvic exam and Pap test. Hepatitis B test. Hepatitis C test. HIV (human immunodeficiency virus) test. STI (sexually transmitted infection) testing, if you are at risk. Lung cancer screening. Colorectal cancer screening. Mammogram. Talk with your health care provider about when you should start having regular mammograms. This may depend on whether you have a family history of breast cancer. BRCA-related cancer screening. This may be done if you have a family history of breast, ovarian, tubal, or peritoneal cancers. Bone density scan. This is done to screen for osteoporosis. Talk with your health care provider about your test results, treatment options, and if necessary, the need for more tests. Follow these instructions at home: Eating and drinking  Eat a diet that includes fresh fruits and vegetables, whole grains, lean protein, and low-fat dairy products. Take vitamin and mineral supplements as recommended by your health care provider. Do not drink alcohol if: Your health care provider tells you not to drink. You are pregnant, may be pregnant, or are planning to become pregnant. If you drink alcohol: Limit how much you have to 0-1 drink a day. Know how much alcohol is in your drink. In the [place] one drink equals one 12 oz bottle of beer (355 mL),  one 5 oz glass of wine (148 mL), or one 1 oz glass of hard liquor (44 mL). Lifestyle Brush your teeth every morning and night with  fluoride toothpaste. Floss one time each day. Exercise for at least 30 minutes 5 or more days each week. Do not use any products that contain nicotine or tobacco. These products include cigarettes, chewing tobacco, and vaping devices, such as e-cigarettes. If you need help quitting, ask your health care provider. Do not use drugs. If you are sexually active, practice safe sex. Use a condom or other form of protection to prevent STIs. If you do not wish to become pregnant, use a form of birth control. If you plan to become pregnant, see your health care provider for a prepregnancy visit. Take aspirin only as told by your health care provider. Make sure that you understand how much to take and what form to take. Work with your health care provider to find out whether it is safe and beneficial for you to take aspirin daily. Find healthy ways to manage stress, such as: Meditation, yoga, or listening to music. Journaling. Talking to a trusted person. Spending time with friends and family. Minimize exposure to UV radiation to reduce your risk of skin cancer. Safety Always wear your seat belt while driving or riding in a vehicle. Do not drive: If you have been drinking alcohol. Do not ride with someone who has been drinking. When you are tired or distracted. While texting. If you have been using any mind-altering substances or drugs. Wear a helmet and other protective equipment during sports activities. If you have firearms in your house, make sure you follow all gun safety procedures. Seek help if you have been physically or sexually abused. What's next? Visit your health care provider once a year for an annual wellness visit. Ask your health care provider how often you should have your eyes and teeth checked. Stay up to date on all vaccines. This information is not intended to replace advice given to you by your health care provider. Make sure you discuss any questions you have with your health  care provider. Document Revised: 12/31/2020 Document Reviewed: 12/31/2020 Elsevier Patient Education  2024 [employer].

## 2023-04-14 NOTE — Addendum Note (Signed)
Addended by: Maximino Sarin on: 04/14/2023 08:51 AM   Modules accepted: Orders

## 2023-04-14 NOTE — Progress Notes (Signed)
Subjective:    Patient ID: Laurie Chang, female    DOB: [birth year], 57 y.o.   MRN: [id number]  HPI  Pt in for wellness exam.  Pt is fasting. Pt working at [employer]. Pt is eating better and had purposeful wt loss of 5 lbs.  Pt is dong some light weights and sit ups at home. 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 Pt quit smoking but vapes rarely.Marland Kitchen Pt failed various cessation methods incuding wellbutrin(she used for one month only).   No alcohol use. Pt drinks 1.5 cups of coffee a day.   Recent mammogram in last month. Upcoming colonoscopy oct 9 th. On review pt had hysterectomy in 2012.  Pt will get flu vaccine today.   Bilateral knee pain that has been chronic. Nsaids and tylenol sometimes  not adequate. Takes occasional. Pt used lmited number. At times 15 tab may last 2 months. Rt knee is worse. Pt is under contract and uds. Both up to date.  Both side osteoarthritis. Htn-  controlled. pt is on losartan 100-12.5 mg daily.  Anxiety- controlled with sertraline.  Review of Systems  Constitutional:  Negative for chills, fatigue and fever.  Respiratory:  Negative for cough, chest tightness and wheezing.   Cardiovascular:  Negative for chest pain and palpitations.  Gastrointestinal:  Negative for abdominal pain, blood in stool, diarrhea and nausea.  Genitourinary:  Negative for dysuria, flank pain and frequency.  Musculoskeletal:  Negative for back pain and joint swelling.       Knee pain.  Skin:  Negative for rash.  Neurological:  Negative for dizziness, speech difficulty, weakness and light-headedness.  Hematological:  Negative for adenopathy. Does not bruise/bleed easily.  Psychiatric/Behavioral:  Negative for behavioral problems, dysphoric mood and sleep disturbance. The patient is not nervous/anxious.     Past Medical History:  Diagnosis Date   Arthritis    Asthma    as a child    Hyperlipidemia    Hypertension    Leiomyoma uteri     Smoker    1/2 PPD   Syncope and collapse      Social History   Socioeconomic History   Marital status: Married    Spouse name: Not on file   Number of children: 3   Years of education: 12   Highest education level: Some college, no degree  Occupational History   Occupation: Med Theme park manager: Radio producer [employer]    Comment: [facility]  Tobacco Use   Smoking status: Every Day    Current packs/day: 0.50    Average packs/day: 0.5 packs/day for 30.0 years (15.0 ttl pk-yrs)    Types: Cigarettes   Smokeless tobacco: Never  Vaping Use   Vaping status: Every Day  Substance and Sexual Activity   Alcohol use: Not Currently    Comment: once in a blue moon   Drug use: No   Sexual activity: Yes    Partners: Male    Birth control/protection: Surgical  Other Topics Concern   Not on file  Social History Narrative   Widowed in 2012.  Lives with her daughter and two granddaughters.   Social Determinants of Health   Financial Resource Strain: Low Risk  (12/24/2022)   Overall Financial Resource Strain (CARDIA)    Difficulty of Paying Living Expenses: Not hard at all  Food Insecurity: No Food Insecurity (12/24/2022)   Hunger Vital Sign    Worried About Running Out of  Food in the Last Year: Never true    Ran Out of Food in the Last Year: Never true  Transportation Needs: No Transportation Needs (12/24/2022)   PRAPARE - Administrator, Civil Service (Medical): No    Lack of Transportation (Non-Medical): No  Physical Activity: Insufficiently Active (12/24/2022)   Exercise Vital Sign    Days of Exercise per Week: 5 days    Minutes of Exercise per Session: 20 min  Stress: Stress Concern Present (12/24/2022)   [employer] of Occupational Health - Occupational Stress Questionnaire    Feeling of Stress : Rather much  Social Connections: Moderately Isolated (12/24/2022)   Social Connection and Isolation Panel [NHANES]    Frequency of Communication with  Friends and Family: More than three times a week    Frequency of Social Gatherings with Friends and Family: Once a week    Attends Religious Services: Never    Database administrator or Organizations: No    Attends Engineer, structural: Not on file    Marital Status: Married  Intimate Partner Violence: Unknown (10/21/2021)   Received from [employer], [facility]   HITS    Physically Hurt: Not on file    Insult or Talk Down To: Not on file    Threaten Physical Harm: Not on file    Scream or Curse: Not on file    Past Surgical History:  Procedure Laterality Date   ABDOMINAL HYSTERECTOMY  12/11/10   TAH,RSO   HYSTEROSCOPY  11/2000   HYSTEROSCOPIC MYOMECTOMY   LAPAROSCOPIC CHOLECYSTECTOMY     OOPHORECTOMY  12/11/10   TAH,RSO   TUBAL LIGATION     VEIN SURGERY      Family History  Problem Relation Age of Onset   Cancer Mother        LUNG   Hypertension Father    Heart disease Father    Hypertension Brother    Cancer Maternal Aunt 38       breast   Cancer Maternal Grandmother 89       breast   Breast cancer Maternal Grandmother    Colon cancer Neg Hx    Colon polyps Neg Hx    Esophageal cancer Neg Hx    Stomach cancer Neg Hx    Rectal cancer Neg Hx     No Known Allergies  Current Outpatient Medications on File Prior to Visit  Medication Sig Dispense Refill   atorvastatin (LIPITOR) 10 MG tablet Take 1 tablet by mouth once daily 90 tablet 0   cyclobenzaprine (FLEXERIL) 10 MG tablet TAKE 1/2 TO 1 (ONE-HALF TO ONE) TABLET BY MOUTH THREE TIMES DAILY AS NEEDED FOR MUSCLE SPASM 21 tablet 0   losartan-hydrochlorothiazide (HYZAAR) 100-12.5 MG tablet Take 1 tablet by mouth once daily 90 tablet 0   metFORMIN (GLUCOPHAGE) 500 MG tablet Take 1 tablet (500 mg total) by mouth 2 (two) times daily with a meal. 180 tablet 3   Multiple Vitamins-Minerals (ONE-A-DAY WOMENS PO) Take by mouth.     sertraline (ZOLOFT) 25 MG tablet Take 1 tablet (25 mg total) by mouth daily. 90  tablet 3   traMADol (ULTRAM) 50 MG tablet Take 1 tablet (50 mg total) by mouth every 6 (six) hours as needed. 15 tablet 0   traZODone (DESYREL) 50 MG tablet Take 0.5-1 tablets (25-50 mg total) by mouth at bedtime as needed for sleep. 30 tablet 3   No current facility-administered medications on file prior to visit.  BP 122/88 (BP Location: Left Arm, Patient Position: Sitting, Cuff Size: Large)   Pulse 80   Temp 98.6 F (37 C) (Oral)   Resp 18   Ht 5\' 5"  (1.651 m)   Wt 200 lb 3.2 oz (90.8 kg)   LMP 08/19/2010   SpO2 99%   BMI 33.32 kg/m        Objective:   Physical Exam  General Mental Status- Alert. General Appearance- Not in acute distress.   Skin General: Color- Normal Color. Moisture- Normal Moisture.  Neck Carotid Arteries- Normal color. Moisture- Normal Moisture. No carotid bruits. No JVD.  Chest and Lung Exam Auscultation: Breath Sounds:-Normal.  Cardiovascular Auscultation:Rythm- Regular. Murmurs & Other Heart Sounds:Auscultation of the heart reveals- No Murmurs.  Abdomen Inspection:-Inspeection Normal. Palpation/Percussion:Note:No mass. Palpation and Percussion of the abdomen reveal- Non Tender, Non Distended + BS, no rebound or guarding.   Neurologic Cranial Nerve exam:- CN III-XII intact(No nystagmus), symmetric smile. al/IntactStrength:- 5/5 equal and symmetric strength both upper and lower extremities.   Knees- mild crepitus on range of motion.    Assessment & Plan:     Patient Instructions  For you wellness exam today I have ordered cbc, cmp, A1c and lipid panel.  Flu vaccine today.  Mammogram up to date and colonoscopy pending.  Recommend exercise and healthy diet.  We will let you know lab results as they come in.  Follow up early march controlled med visit/routine follow up med problems or sooner if needed  Chronic knee pain. Up to date on contract and uds. Continue meloxicam and tramadol. Rx advisement given.  Both side  osteoarthritis. Htn-  controlled. pt is on losartan 100-12.5 mg daily.  Anxiety- controlled with sertraline.        Esperanza Richters, [place]   [id number] charge as did address chronic knee pain(controlled med visit was needed), htn and anxiety

## 2023-04-14 NOTE — Addendum Note (Signed)
Addended by: Roxanne Gates on: 04/14/2023 08:44 AM   Modules accepted: Orders

## 2023-04-15 NOTE — Addendum Note (Signed)
Addended by: Gwenevere Abbot on: 04/15/2023 05:06 PM   Modules accepted: Orders

## 2023-04-17 LAB — DRUG MONITORING PANEL 376104, URINE
Amphetamines: NEGATIVE ng/mL (ref <500)
Barbiturates: NEGATIVE ng/mL (ref <300)
Benzodiazepines: NEGATIVE ng/mL (ref <100)
Cocaine Metabolite: NEGATIVE ng/mL (ref <150)
Desmethyltramadol: NEGATIVE ng/mL (ref <100)
Opiates: NEGATIVE ng/mL (ref <100)
Oxycodone: NEGATIVE ng/mL (ref <100)
Tramadol: NEGATIVE ng/mL (ref <100)

## 2023-04-17 LAB — DM TEMPLATE

## 2023-04-19 ENCOUNTER — Other Ambulatory Visit: Payer: Self-pay | Admitting: Medical

## 2023-04-19 ENCOUNTER — Encounter: Payer: Self-pay | Admitting: Gastroenterology

## 2023-04-21 ENCOUNTER — Other Ambulatory Visit: Payer: Self-pay | Admitting: Medical

## 2023-04-26 ENCOUNTER — Telehealth: Payer: Self-pay | Admitting: Gastroenterology

## 2023-04-26 NOTE — Telephone Encounter (Signed)
error 

## 2023-04-27 ENCOUNTER — Ambulatory Visit: Payer: BC Managed Care – PPO | Admitting: Gastroenterology

## 2023-04-27 ENCOUNTER — Encounter: Payer: Self-pay | Admitting: Gastroenterology

## 2023-04-27 VITALS — BP 144/77 | HR 65 | Temp 98.4°F | Resp 13 | Ht 65.0 in | Wt 195.0 lb

## 2023-04-27 DIAGNOSIS — D128 Benign neoplasm of rectum: Secondary | ICD-10-CM

## 2023-04-27 DIAGNOSIS — Z09 Encounter for follow-up examination after completed treatment for conditions other than malignant neoplasm: Secondary | ICD-10-CM

## 2023-04-27 DIAGNOSIS — K635 Polyp of colon: Secondary | ICD-10-CM | POA: Diagnosis not present

## 2023-04-27 DIAGNOSIS — Z860100 Personal history of colon polyps, unspecified: Secondary | ICD-10-CM | POA: Diagnosis not present

## 2023-04-27 DIAGNOSIS — Z1211 Encounter for screening for malignant neoplasm of colon: Secondary | ICD-10-CM | POA: Diagnosis not present

## 2023-04-27 DIAGNOSIS — D125 Benign neoplasm of sigmoid colon: Secondary | ICD-10-CM

## 2023-04-27 MED ORDER — SODIUM CHLORIDE 0.9 % IV SOLN: 500.0000 mL | Freq: Once | INTRAVENOUS | Status: DC

## 2023-04-27 NOTE — Progress Notes (Signed)
Called to room to assist during endoscopic procedure.  Patient ID and intended procedure confirmed with present staff. Received instructions for my participation in the procedure from the performing physician.  

## 2023-04-27 NOTE — Op Note (Signed)
[facility] Patient Name: Laurie Chang Procedure Date: 04/27/2023 10:45 AM MRN: [id number] Endoscopist: Lynann Bologna , MD, [id number] Age: 57 Referring MD:  Date of Birth: 26-Jun-1966 Gender: Female Account #: [id number] Procedure:                Colonoscopy Indications:              High risk colon cancer surveillance: Personal                            history of colonic polyps Medicines:                Monitored Anesthesia Care Procedure:                Pre-Anesthesia Assessment:                           - Prior to the procedure, a History and Physical                            was performed, and patient medications and                            allergies were reviewed. The patient's tolerance of                            previous anesthesia was also reviewed. The risks                            and benefits of the procedure and the sedation                            options and risks were discussed with the patient.                            All questions were answered, and informed consent                            was obtained. Prior Anticoagulants: The patient has                            taken no anticoagulant or antiplatelet agents. ASA                            Grade Assessment: II - A patient with mild systemic                            disease. After reviewing the risks and benefits,                            the patient was deemed in satisfactory condition to                            undergo the procedure.  After obtaining informed consent, the colonoscope                            was passed under direct vision. Throughout the                            procedure, the patient's blood pressure, pulse, and                            oxygen saturations were monitored continuously. The                            Olympus Scope PCF SN: [id number] was introduced                            through the anus and advanced to the 2  cm into the                            ileum. The colonoscopy was performed without                            difficulty. The patient tolerated the procedure                            well. The quality of the bowel preparation was                            adequate to identify polyps. Some solid retained                            stool in the sigmoid colon making it somewhat                            harder to visualize. Nevertheless, aggressive                            suctioning and aspiration was performed. The                            terminal ileum, ileocecal valve, appendiceal                            orifice, and rectum were photographed. Scope In: 10:51:27 AM Scope Out: 11:04:17 AM Scope Withdrawal Time: 0 hours 9 minutes 42 seconds  Total Procedure Duration: 0 hours 12 minutes 50 seconds  Findings:                 Three sessile polyps were found in the rectum and                            distal sigmoid colon(2). The polyps were 5 to 6 mm                            in size. These  polyps were removed with a cold                            snare. Resection and retrieval were complete.                           Multiple medium-mouthed diverticula were found in                            the sigmoid colon, descending colon and rare in asc                            colon.                           Non-bleeding internal hemorrhoids were found during                            retroflexion. The hemorrhoids were small and Grade                            I (internal hemorrhoids that do not prolapse).                           The terminal ileum appeared normal.                           The exam was otherwise without abnormality on                            direct and retroflexion views. Complications:            No immediate complications. Estimated Blood Loss:     Estimated blood loss: none. Impression:               - Three 5 to 6 mm polyps in the rectum and in the                             distal sigmoid colon, removed with a cold snare.                            Resected and retrieved.                           - Moderate predominantly sigmoid diverticulosis.                           - Non-bleeding internal hemorrhoids.                           - The examined portion of the ileum was normal.                           - The examination was otherwise normal on direct  and retroflexion views. Recommendation:           - Patient has a contact number available for                            emergencies. The signs and symptoms of potential                            delayed complications were discussed with the                            patient. Return to normal activities tomorrow.                            Written discharge instructions were provided to the                            patient.                           - Resume previous diet.                           - Continue present medications.                           - Await pathology results.                           - Use Benefiber one teaspoon PO daily with 8 ounces                            of water.                           - The findings and recommendations were discussed                            with the patient's family. Lynann Bologna, MD 04/27/2023 11:10:54 AM This report has been signed electronically.

## 2023-04-27 NOTE — Progress Notes (Signed)
[facility] History and Physical   Primary Care Physician:  Esperanza Richters, PA-C   Reason for Procedure:   H/O polyps  Plan:    colon     HPI: Laurie Chang is a 57 y.o. female    Past Medical History:  Diagnosis Date   Arthritis    Asthma    as a child    Hyperlipidemia    Hypertension    Leiomyoma uteri    Smoker    1/2 PPD   Syncope and collapse     Past Surgical History:  Procedure Laterality Date   ABDOMINAL HYSTERECTOMY  12/11/10   TAH,RSO   HYSTEROSCOPY  11/2000   HYSTEROSCOPIC MYOMECTOMY   LAPAROSCOPIC CHOLECYSTECTOMY     OOPHORECTOMY  12/11/10   TAH,RSO   TUBAL LIGATION     VEIN SURGERY      Prior to Admission medications   Medication Sig Start Date End Date Taking? Authorizing Provider  atorvastatin (LIPITOR) 10 MG tablet Take 1 tablet by mouth once daily 03/22/23  Yes Saguier, Ramon Dredge, PA-C  losartan-hydrochlorothiazide [facility]) 100-12.5 MG tablet Take 1 tablet by mouth once daily 04/19/23  Yes Saguier, Ramon Dredge, PA-C  metFORMIN (GLUCOPHAGE) 500 MG tablet Take 1 tablet (500 mg total) by mouth 2 (two) times daily with a meal. 02/02/23  Yes Saguier, Ramon Dredge, PA-C  Multiple Vitamins-Minerals (ONE-A-DAY WOMENS PO) Take by mouth.   Yes [provider]  sertraline (ZOLOFT) 25 MG tablet Take 1 tablet (25 mg total) by mouth daily. 02/02/23  Yes Saguier, Ramon Dredge, PA-C  cyclobenzaprine (FLEXERIL) 10 MG tablet TAKE 1/2 TO 1 (ONE-HALF TO ONE) TABLET BY MOUTH THREE TIMES DAILY AS NEEDED FOR MUSCLE SPASM 03/19/23   Saguier, Ramon Dredge, PA-C  meloxicam (MOBIC) 7.5 MG tablet Take 1 tablet (7.5 mg total) by mouth daily. 04/14/23   Saguier, Ramon Dredge, PA-C  traMADol (ULTRAM) 50 MG tablet Take 1 tablet (50 mg total) by mouth every 6 (six) hours as needed. 03/19/23   Saguier, Ramon Dredge, PA-C    Current Outpatient Medications  Medication Sig Dispense Refill   atorvastatin (LIPITOR) 10 MG tablet Take 1 tablet by mouth once daily 90 tablet 0    losartan-hydrochlorothiazide (HYZAAR) 100-12.5 MG tablet Take 1 tablet by mouth once daily 90 tablet 0   metFORMIN (GLUCOPHAGE) 500 MG tablet Take 1 tablet (500 mg total) by mouth 2 (two) times daily with a meal. 180 tablet 3   Multiple Vitamins-Minerals (ONE-A-DAY WOMENS PO) Take by mouth.     sertraline (ZOLOFT) 25 MG tablet Take 1 tablet (25 mg total) by mouth daily. 90 tablet 3   cyclobenzaprine (FLEXERIL) 10 MG tablet TAKE 1/2 TO 1 (ONE-HALF TO ONE) TABLET BY MOUTH THREE TIMES DAILY AS NEEDED FOR MUSCLE SPASM 21 tablet 0   meloxicam (MOBIC) 7.5 MG tablet Take 1 tablet (7.5 mg total) by mouth daily. 30 tablet 0   traMADol (ULTRAM) 50 MG tablet Take 1 tablet (50 mg total) by mouth every 6 (six) hours as needed. 15 tablet 0   No current facility-administered medications for this visit.    Allergies as of 04/27/2023   (No Known Allergies)    Family History  Problem Relation Age of Onset   Cancer Mother        LUNG   Hypertension Father    Heart disease Father    Hypertension Brother    Cancer Maternal Aunt 33       breast   Cancer Maternal Grandmother 21  breast   Breast cancer Maternal Grandmother    Colon cancer Neg Hx    Colon polyps Neg Hx    Esophageal cancer Neg Hx    Stomach cancer Neg Hx    Rectal cancer Neg Hx     Social History   Socioeconomic History   Marital status: Married    Spouse name: Not on file   Number of children: 3   Years of education: 12   Highest education level: Some college, no degree  Occupational History   Occupation: Med Theme park manager: [employer]    Comment: [employer]  Tobacco Use   Smoking status: Every Day    Current packs/day: 0.50    Average packs/day: 0.5 packs/day for 30.0 years (15.0 ttl pk-yrs)    Types: Cigarettes   Smokeless tobacco: Never  Vaping Use   Vaping status: Every Day  Substance and Sexual Activity   Alcohol use: Not Currently    Comment: once in a blue moon   Drug  use: No   Sexual activity: Yes    Partners: Male    Birth control/protection: Surgical  Other Topics Concern   Not on file  Social History Narrative   Widowed in 2012.  Lives with her daughter and two granddaughters.   Social Determinants of Health   Financial Resource Strain: Low Risk  (12/24/2022)   Overall Financial Resource Strain (CARDIA)    Difficulty of Paying Living Expenses: Not hard at all  Food Insecurity: No Food Insecurity (12/24/2022)   Hunger Vital Sign    Worried About Running Out of Food in the Last Year: Never true    Ran Out of Food in the Last Year: Never true  Transportation Needs: No Transportation Needs (12/24/2022)   PRAPARE - Administrator, Civil Service (Medical): No    Lack of Transportation (Non-Medical): No  Physical Activity: Insufficiently Active (12/24/2022)   Exercise Vital Sign    Days of Exercise per Week: 5 days    Minutes of Exercise per Session: 20 min  Stress: Stress Concern Present (12/24/2022)   [employer] of Occupational Health - Occupational Stress Questionnaire    Feeling of Stress : Rather much  Social Connections: Moderately Isolated (12/24/2022)   Social Connection and Isolation Panel [NHANES]    Frequency of Communication with Friends and Family: More than three times a week    Frequency of Social Gatherings with Friends and Family: Once a week    Attends Religious Services: Never    Database administrator or Organizations: No    Attends Engineer, structural: Not on file    Marital Status: Married  Intimate Partner Violence: Unknown (10/21/2021)   Received from [employer], [facility]   HITS    Physically Hurt: Not on file    Insult or Talk Down To: Not on file    Threaten Physical Harm: Not on file    Scream or Curse: Not on file    Review of Systems: Positive for none All other review of systems negative except as mentioned in the HPI.  Physical Exam: Vital signs in last 24 hours: @VSRANGES @    General:   Alert,  Well-developed, well-nourished, pleasant and cooperative in NAD Lungs:  Clear throughout to auscultation.   Heart:  Regular rate and rhythm; no murmurs, clicks, rubs,  or gallops. Abdomen:  Soft, nontender and nondistended. Normal bowel sounds.   Neuro/Psych:  Alert and cooperative. Normal mood  and affect. A and O x 3    No significant changes were identified.  The patient continues to be an appropriate candidate for the planned procedure and anesthesia.   Edman Circle, MD. [facility] 04/27/2023 10:46 AM@

## 2023-04-27 NOTE — Progress Notes (Signed)
Sedate, gd SR, tolerated procedure well, VSS, report to RN 

## 2023-04-27 NOTE — Patient Instructions (Signed)
Discharge instructions given. Handouts on polyps,Diverticulosis and Hemorrhoids. Resume previous medications. Use Benefiber one teaspoon by mouth daily with 8 ounces of water. YOU HAD AN ENDOSCOPIC PROCEDURE TODAY AT THE [facility]:   Refer to the procedure report that was given to you for any specific questions about what was found during the examination.  If the procedure report does not answer your questions, please call your gastroenterologist to clarify.  If you requested that your care partner not be given the details of your procedure findings, then the procedure report has been included in a sealed envelope for you to review at your convenience later.  YOU SHOULD EXPECT: Some feelings of bloating in the abdomen. Passage of more gas than usual.  Walking can help get rid of the air that was put into your GI tract during the procedure and reduce the bloating. If you had a lower endoscopy (such as a colonoscopy or flexible sigmoidoscopy) you may notice spotting of blood in your stool or on the toilet paper. If you underwent a bowel prep for your procedure, you may not have a normal bowel movement for a few days.  Please Note:  You might notice some irritation and congestion in your nose or some drainage.  This is from the oxygen used during your procedure.  There is no need for concern and it should clear up in a day or so.  SYMPTOMS TO REPORT IMMEDIATELY:  Following lower endoscopy (colonoscopy or flexible sigmoidoscopy):  Excessive amounts of blood in the stool  Significant tenderness or worsening of abdominal pains  Swelling of the abdomen that is new, acute  Fever of 100F or higher   For urgent or emergent issues, a gastroenterologist can be reached at any hour by calling [phone]. Do not use MyChart messaging for urgent concerns.    DIET:  We do recommend a small meal at first, but then you may proceed to your regular diet.  Drink plenty of fluids but you should  avoid alcoholic beverages for 24 hours.  ACTIVITY:  You should plan to take it easy for the rest of today and you should NOT DRIVE or use heavy machinery until tomorrow (because of the sedation medicines used during the test).    FOLLOW UP: Our staff will call the number listed on your records the next business day following your procedure.  We will call around 7:15- 8:00 am to check on you and address any questions or concerns that you may have regarding the information given to you following your procedure. If we do not reach you, we will leave a message.     If any biopsies were taken you will be contacted by phone or by letter within the next 1-3 weeks.  Please call us at [phone] if you have not heard about the biopsies in 3 weeks.    SIGNATURES/CONFIDENTIALITY: You and/or your care partner have signed paperwork which will be entered into your electronic medical record.  These signatures attest to the fact that that the information above on your After Visit Summary has been reviewed and is understood.  Full responsibility of the confidentiality of this discharge information lies with you and/or your care-partner.

## 2023-04-28 ENCOUNTER — Telehealth: Payer: Self-pay

## 2023-04-28 NOTE — Telephone Encounter (Signed)
  Follow up Call-     04/27/2023   10:29 AM  Call back number  Post procedure Call Back phone  # [phone]  Permission to leave phone message Yes     Patient questions:  Do you have a fever, pain , or abdominal swelling? No. Pain Score  0 *  Have you tolerated food without any problems? Yes.    Have you been able to return to your normal activities? Yes.    Do you have any questions about your discharge instructions: Diet   No. Medications  No. Follow up visit  No.  Do you have questions or concerns about your Care? No.  Actions: * If pain score is 4 or above: No action needed, pain <4.

## 2023-04-29 LAB — SURGICAL PATHOLOGY

## 2023-05-02 ENCOUNTER — Other Ambulatory Visit: Payer: Self-pay | Admitting: Medical

## 2023-05-02 NOTE — Telephone Encounter (Signed)
Requesting: tramadol 50mg   Contract:09/24/22 UDS:04/14/23 Last Visit: 04/14/23 Next Visit: None Last Refill:03/19/23 #15 and 0RF   Please Advise

## 2023-05-04 ENCOUNTER — Other Ambulatory Visit: Payer: Self-pay | Admitting: Medical

## 2023-05-05 ENCOUNTER — Encounter: Payer: Self-pay | Admitting: Medical

## 2023-05-05 ENCOUNTER — Encounter: Payer: Self-pay | Admitting: Gastroenterology

## 2023-05-05 NOTE — Telephone Encounter (Signed)
Rx refill sent to pharmacy. 

## 2023-05-10 ENCOUNTER — Other Ambulatory Visit: Payer: Self-pay | Admitting: Medical

## 2023-05-10 MED ORDER — MELOXICAM 7.5 MG PO TABS: 7.5000 mg | ORAL_TABLET | Freq: Every day | ORAL | 0 refills | Status: DC

## 2023-05-12 ENCOUNTER — Encounter: Payer: Self-pay | Admitting: Medical

## 2023-05-19 ENCOUNTER — Inpatient Hospital Stay: Payer: BC Managed Care – PPO | Admitting: Hematology & Oncology

## 2023-05-19 ENCOUNTER — Encounter: Payer: Self-pay | Admitting: Hematology & Oncology

## 2023-05-19 ENCOUNTER — Inpatient Hospital Stay: Payer: BC Managed Care – PPO | Attending: Hematology & Oncology

## 2023-05-19 ENCOUNTER — Ambulatory Visit: Payer: BC Managed Care – PPO

## 2023-05-19 VITALS — BP 152/83 | HR 98 | Temp 98.6°F | Resp 20 | Ht 65.0 in | Wt 201.0 lb

## 2023-05-19 DIAGNOSIS — D751 Secondary polycythemia: Secondary | ICD-10-CM | POA: Insufficient documentation

## 2023-05-19 DIAGNOSIS — I10 Essential (primary) hypertension: Secondary | ICD-10-CM

## 2023-05-19 DIAGNOSIS — E119 Type 2 diabetes mellitus without complications: Secondary | ICD-10-CM

## 2023-05-19 DIAGNOSIS — K5730 Diverticulosis of large intestine without perforation or abscess without bleeding: Secondary | ICD-10-CM | POA: Insufficient documentation

## 2023-05-19 DIAGNOSIS — D7589 Other specified diseases of blood and blood-forming organs: Secondary | ICD-10-CM

## 2023-05-19 DIAGNOSIS — Z801 Family history of malignant neoplasm of trachea, bronchus and lung: Secondary | ICD-10-CM | POA: Diagnosis not present

## 2023-05-19 DIAGNOSIS — F17210 Nicotine dependence, cigarettes, uncomplicated: Secondary | ICD-10-CM | POA: Insufficient documentation

## 2023-05-19 DIAGNOSIS — Z7984 Long term (current) use of oral hypoglycemic drugs: Secondary | ICD-10-CM

## 2023-05-19 DIAGNOSIS — R5383 Other fatigue: Secondary | ICD-10-CM | POA: Diagnosis not present

## 2023-05-19 DIAGNOSIS — Z803 Family history of malignant neoplasm of breast: Secondary | ICD-10-CM | POA: Diagnosis not present

## 2023-05-19 DIAGNOSIS — Z136 Encounter for screening for cardiovascular disorders: Secondary | ICD-10-CM

## 2023-05-19 LAB — CMP (CANCER CENTER ONLY)
ALT: 21 U/L (ref 0–44)
AST: 17 U/L (ref 15–41)
Albumin: 4.2 g/dL (ref 3.5–5.0)
Alkaline Phosphatase: 62 U/L (ref 38–126)
Anion gap: 9 (ref 5–15)
BUN: 9 mg/dL (ref 6–20)
CO2: 24 mmol/L (ref 22–32)
Calcium: 8.9 mg/dL (ref 8.9–10.3)
Chloride: 104 mmol/L (ref 98–111)
Creatinine: 0.83 mg/dL (ref 0.44–1.00)
GFR, Estimated: >60 mL/min (ref >60)
Glucose, Bld: 180 mg/dL — ABNORMAL HIGH (ref 70–99)
Potassium: 3.5 mmol/L (ref 3.5–5.1)
Sodium: 137 mmol/L (ref 135–145)
Total Bilirubin: 0.3 mg/dL (ref 0.3–1.2)
Total Protein: 6.8 g/dL (ref 6.5–8.1)

## 2023-05-19 LAB — RETICULOCYTES
Immature Retic Fract: 8.5 % (ref 2.3–15.9)
RBC.: 4.78 MIL/uL (ref 3.87–5.11)
Retic Count, Absolute: 81.3 10*3/uL (ref 19.0–186.0)
Retic Ct Pct: 1.7 % (ref 0.4–3.1)

## 2023-05-19 LAB — CBC WITH DIFFERENTIAL (CANCER CENTER ONLY)
Abs Immature Granulocytes: 0.02 10*3/uL (ref 0.00–0.07)
Basophils Absolute: 0.1 10*3/uL (ref 0.0–0.1)
Basophils Relative: 1 %
Eosinophils Absolute: 0.2 10*3/uL (ref 0.0–0.5)
Eosinophils Relative: 2 %
HCT: 43.7 % (ref 36.0–46.0)
Hemoglobin: 14.9 g/dL (ref 12.0–15.0)
Immature Granulocytes: 0 %
Lymphocytes Relative: 39 %
Lymphs Abs: 3.7 10*3/uL (ref 0.7–4.0)
MCH: 30.8 pg (ref 26.0–34.0)
MCHC: 34.1 g/dL (ref 30.0–36.0)
MCV: 90.5 fL (ref 80.0–100.0)
Monocytes Absolute: 0.4 10*3/uL (ref 0.1–1.0)
Monocytes Relative: 4 %
Neutro Abs: 5.1 10*3/uL (ref 1.7–7.7)
Neutrophils Relative %: 54 %
Platelet Count: 304 10*3/uL (ref 150–400)
RBC: 4.83 MIL/uL (ref 3.87–5.11)
RDW: 12.6 % (ref 11.5–15.5)
WBC Count: 9.4 10*3/uL (ref 4.0–10.5)
nRBC: 0 % (ref 0.0–0.2)

## 2023-05-19 LAB — SAVE SMEAR(SSMR), FOR PROVIDER SLIDE REVIEW

## 2023-05-19 LAB — LACTATE DEHYDROGENASE: LDH: 153 U/L (ref 98–192)

## 2023-05-19 LAB — FERRITIN: Ferritin: 57 ng/mL (ref 11–307)

## 2023-05-19 LAB — VITAMIN B12: Vitamin B-12: 440 pg/mL (ref 180–914)

## 2023-05-19 NOTE — Progress Notes (Signed)
Referral MD  Reason for Referral: Transient erythrocytosis  Chief Complaint  Patient presents with   New Patient (Initial Visit)    "My hemoglobin is high."  : I was told that my blood was too high.  HPI: Laurie Chang is a very charming 57 year old white female.  She is originally from [place].  She has been down in [place] for about 40 years.  She is already a great grandmother.  I am very impressed by this.  She does have her health issues.  She does have diabetes.  She does have hypertension.  She is followed closely by Ed Saguier.  He has noted that she has had erythrocytosis.  Back in April of this year, her white cell count is 8.9.  Hemoglobin 16.  Hematocrit was 45.2.  Platelet count 371,000.  She had MCV of 90.  In September, her white cell count was 10.2.  Hemoglobin 15.8.  Platelet count 367,000.  The hematocrit was 47%.  She had normal electrolytes.  In September, her sodium was 136.  Potassium 4.4.  BUN 10 creatinine 0.78.  Calcium was 9.5.  She had normal LFTs.  She had a CT of the abdomen pelvis in April.  This was relatively unremarkable.  She did have some multiple bowel diverticulosis, severe in the sigmoid colon.  There is no evidence of splenomegaly.  She was kindly referred to the [facility] for an evaluation of the erythrocytosis..  She does not smoke.  She is trying to cut back on smoking.  She is mostly worried about weight gain.  She is trying to lose some weight.  She has had a hysterectomy in the past.  She has had her gallbladder taken out.  As far she knows, there is no blood in the family has any kind of blood problems..  She still smokes.  She probably has about a 50-pack-year history of tobacco use.  I think she bodies to have a low-dose CT scan of the chest just for screening.  She would like to have this.  She had her last mammogram about a month or so ago.  She had a colonoscopy just 3 weeks ago.  She did have COVID but  really was not sick from COVID.  She lost her taste.  She does feel tired on occasion..  She is still working.  Overall, I would say that her performance status is probably ECOG 1.  Past Medical History:  Diagnosis Date   Arthritis    Asthma    as a child    Hyperlipidemia    Hypertension    Leiomyoma uteri    Smoker    1/2 PPD   Syncope and collapse   :   Past Surgical History:  Procedure Laterality Date   ABDOMINAL HYSTERECTOMY  12/11/10   TAH,RSO   HYSTEROSCOPY  11/2000   HYSTEROSCOPIC MYOMECTOMY   LAPAROSCOPIC CHOLECYSTECTOMY     OOPHORECTOMY  12/11/10   TAH,RSO   TUBAL LIGATION     VEIN SURGERY    :   Current Outpatient Medications:    atorvastatin (LIPITOR) 10 MG tablet, Take 1 tablet by mouth once daily, Disp: 90 tablet, Rfl: 0   losartan-hydrochlorothiazide (HYZAAR) 100-12.5 MG tablet, Take 1 tablet by mouth once daily, Disp: 90 tablet, Rfl: 0   metFORMIN (GLUCOPHAGE) 500 MG tablet, Take 1 tablet (500 mg total) by mouth 2 (two) times daily with a meal., Disp: 180 tablet, Rfl: 3   Multiple Vitamins-Minerals (ONE-A-DAY WOMENS PO),  Take by mouth., Disp: , Rfl:    sertraline (ZOLOFT) 25 MG tablet, Take 1 tablet (25 mg total) by mouth daily., Disp: 90 tablet, Rfl: 3   cyclobenzaprine (FLEXERIL) 10 MG tablet, TAKE 1/2 TO 1 (ONE-HALF TO ONE) TABLET BY MOUTH THREE TIMES DAILY AS NEEDED FOR MUSCLE SPASM (Patient not taking: Reported on 05/19/2023), Disp: 21 tablet, Rfl: 0   meloxicam (MOBIC) 7.5 MG tablet, Take 1 tablet (7.5 mg total) by mouth daily. (Patient not taking: Reported on 05/19/2023), Disp: 30 tablet, Rfl: 0   traMADol (ULTRAM) 50 MG tablet, TAKE 1 TABLET BY MOUTH EVERY 6 HOURS AS NEEDED (Patient not taking: Reported on 05/19/2023), Disp: 15 tablet, Rfl: 0:  :  No Known Allergies:   Family History  Problem Relation Age of Onset   Cancer Mother        LUNG   Hypertension Father    Heart disease Father    Hypertension Brother    Cancer Maternal Aunt 32        breast   Cancer Maternal Grandmother 32       breast   Breast cancer Maternal Grandmother    Colon cancer Neg Hx    Colon polyps Neg Hx    Esophageal cancer Neg Hx    Stomach cancer Neg Hx    Rectal cancer Neg Hx   :   Social History   Socioeconomic History   Marital status: Married    Spouse name: Not on file   Number of children: 3   Years of education: 12   Highest education level: Some college, no degree  Occupational History   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  Lives with her daughter and two granddaughters.   Social Determinants of Health   Financial Resource Strain: Low Risk  (12/24/2022)   Overall Financial Resource Strain (CARDIA)    Difficulty of Paying Living Expenses: Not hard at all  Food Insecurity: No Food Insecurity (12/24/2022)   Hunger Vital Sign    Worried About Running Out of Food in the Last Year: Never true    Ran Out of Food in the Last Year: Never true  Transportation Needs: No Transportation Needs (12/24/2022)   PRAPARE - Administrator, Civil Service (Medical): No    Lack of Transportation (Non-Medical): No  Physical Activity: Insufficiently Active (12/24/2022)   Exercise Vital Sign    Days of Exercise per Week: 5 days    Minutes of Exercise per Session: 20 min  Stress: Stress Concern Present (12/24/2022)   [employer] of Occupational Health - Occupational Stress  Questionnaire    Feeling of Stress : Rather much  Social Connections: Moderately Isolated (12/24/2022)   Social Connection and Isolation Panel [NHANES]    Frequency of Communication with Friends and Family: More than three  times a week    Frequency of Social Gatherings with Friends and Family: Once a week    Attends Religious Services: Never    Database administrator or Organizations: No    Attends Engineer, structural: Not on file    Marital Status: Married  Intimate Partner Violence: Unknown (10/21/2021)   Received from [employer], [facility]   HITS    Physically Hurt: Not on file    Insult or Talk Down To: Not on file    Threaten Physical Harm: Not on file    Scream or Curse: Not on file  :  Review of Systems  Constitutional:  Positive for malaise/fatigue.  HENT: Negative.    Eyes: Negative.   Respiratory: Negative.    Cardiovascular:  Positive for chest pain and palpitations.  Gastrointestinal: Negative.   Genitourinary: Negative.   Musculoskeletal: Negative.   Skin: Negative.   Neurological: Negative.   Endo/Heme/Allergies: Negative.   Psychiatric/Behavioral: Negative.       Exam: Vital signs show a temperature of 98.6.  Pulse 98.  Blood pressure 152/83.  Weight is 201 pounds.  @IPVITALS @ Physical Exam Vitals reviewed.  HENT:     Head: Normocephalic and atraumatic.  Eyes:     Pupils: Pupils are equal, round, and reactive to light.  Cardiovascular:     Rate and Rhythm: Normal rate and regular rhythm.     Heart sounds: Normal heart sounds.  Pulmonary:     Effort: Pulmonary effort is normal.     Breath sounds: Normal breath sounds.  Abdominal:     General: Bowel sounds are normal.     Palpations: Abdomen is soft.  Musculoskeletal:        General: No tenderness or deformity. Normal range of motion.     Cervical back: Normal range of motion.  Lymphadenopathy:     Cervical: No cervical adenopathy.  Skin:    General: Skin is warm and dry.      Findings: No erythema or rash.  Neurological:     Mental Status: She is alert and oriented to person, place, and time.  Psychiatric:        Behavior: Behavior normal.        Thought Content: Thought content normal.        Judgment: Judgment normal.     Recent Labs    05/19/23 1335  WBC 9.4  HGB 14.9  HCT 43.7  PLT 304    Recent Labs    05/19/23 1335  NA 137  K 3.5  CL 104  CO2 24  GLUCOSE 180*  BUN 9  CREATININE 0.83  CALCIUM 8.9    Blood smear review: Normochromic and normocytic palpation of red blood cells.  There is no target cells.  I see no nucleated red blood cells.  There is no rouleaux formation.  I see no schistocytes or spherocytes.  White blood cells appear normal in morphology and maturation.  There is no immature myeloid or lymphoid forms.  There are no hypersegmented polys.  Platelets are adequate in number and size.  Pathology: None    Assessment and Plan: Laurie Chang is a very charming 57 year old white female.  She was a lot of fun to talk to.  She had transient erythrocytosis.  Her blood counts are fine today.  Her blood smear looks relatively unremarkable.  We have had to believe that the smoking has some to do with her erythrocytosis..  I do think that the low-dose CT scan  of her chest would not be a bad idea for her.  I do not see a need for a bone marrow biopsy.  I do not see need for any radiographic studies right now.  I cannot find anything on her physical exam that was suspicious.  I would like to follow-up with this.  I think be worthwhile to follow-up with her after the Holiday season.  I think if her blood counts look okay at that point in time, we can probably let her go.  I really think that the smoking is going to be the key aspect of her erythrocytosis.

## 2023-05-19 NOTE — Progress Notes (Signed)
BP remains elevated 152/83, instructed to monitor at home and if it remains elevated, notify PCP, verbalized understanding.

## 2023-05-20 LAB — IRON AND IRON BINDING CAPACITY (CC-WL,HP ONLY)
Iron: 56 ug/dL (ref 28–170)
Saturation Ratios: 16 % (ref 10.4–31.8)
TIBC: 343 ug/dL (ref 250–450)
UIBC: 287 ug/dL (ref 148–442)

## 2023-05-21 LAB — ERYTHROPOIETIN: Erythropoietin: 8.2 m[IU]/mL (ref 2.6–18.5)

## 2023-05-24 ENCOUNTER — Ambulatory Visit: Payer: BC Managed Care – PPO | Attending: Cardiology

## 2023-05-24 ENCOUNTER — Ambulatory Visit: Payer: BC Managed Care – PPO | Attending: Cardiology | Admitting: Cardiology

## 2023-05-24 ENCOUNTER — Encounter: Payer: Self-pay | Admitting: Cardiology

## 2023-05-24 VITALS — BP 126/74 | HR 81 | Ht 65.0 in | Wt 203.0 lb

## 2023-05-24 DIAGNOSIS — F17210 Nicotine dependence, cigarettes, uncomplicated: Secondary | ICD-10-CM | POA: Diagnosis not present

## 2023-05-24 DIAGNOSIS — R002 Palpitations: Secondary | ICD-10-CM | POA: Diagnosis not present

## 2023-05-24 DIAGNOSIS — I493 Ventricular premature depolarization: Secondary | ICD-10-CM | POA: Diagnosis not present

## 2023-05-24 DIAGNOSIS — R0609 Other forms of dyspnea: Secondary | ICD-10-CM | POA: Diagnosis not present

## 2023-05-24 NOTE — Progress Notes (Unsigned)
Cardiology Consultation:    Date:  05/24/2023   ID:  Laurie Chang, DOB Jun 27, 1966, MRN [id number]  PCP:  Esperanza Richters, PA-C  Cardiologist:  Gypsy Balsam, MD   Referring MD: Esperanza Richters, [place]   Chief Complaint  Patient presents with   Palpitations    History of Present Illness:    Laurie Chang is a 57 y.o. female who is being seen today for the evaluation of PVCs at the request of Saguier, Ramon Dredge, [place].  Past medical history significant for essential hypertension, hyperlipidemia, smoking still ongoing she was referred to [place] because she was noted on the monitor to have some PVCs.  She did have some evaluation done couple years ago for lower extremities ischemia and that included ABI those were normal and she was told to have no problem and discharged from practice.  Overall she says she is doing fine she gets short of breath quite easily but denies have any chest pain tightness squeezing pressure burning chest.  She denies have any palpitations she actually surprised that she does not feel any palpitations.  Still continues to smoke but determined to quit.  Trying to be active.  Does have some family history of coronary artery disease but not premature.  Not on any diet try to exercise on a regular basis.  Past Medical History:  Diagnosis Date   Arthritis    Asthma    as a child    Hyperlipidemia    Hypertension    Leiomyoma uteri    Smoker    1/2 PPD   Syncope and collapse     Past Surgical History:  Procedure Laterality Date   ABDOMINAL HYSTERECTOMY  12/11/10   TAH,RSO   HYSTEROSCOPY  11/2000   HYSTEROSCOPIC MYOMECTOMY   LAPAROSCOPIC CHOLECYSTECTOMY     OOPHORECTOMY  12/11/10   TAH,RSO   TUBAL LIGATION     VEIN SURGERY      Current Medications: Current Meds  Medication Sig   atorvastatin (LIPITOR) 10 MG tablet Take 1 tablet by mouth once daily   cyclobenzaprine (FLEXERIL) 10 MG tablet TAKE 1/2 TO 1 (ONE-HALF TO ONE) TABLET BY MOUTH THREE TIMES  DAILY AS NEEDED FOR MUSCLE SPASM (Patient taking differently: Take 10 mg by mouth 3 (three) times daily as needed for muscle spasms. TAKE 1/2 TO 1 (ONE-HALF TO ONE) TABLET BY MOUTH THREE TIMES DAILY AS NEEDED FOR MUSCLE SPASM)   losartan-hydrochlorothiazide (HYZAAR) 100-12.5 MG tablet Take 1 tablet by mouth once daily   meloxicam (MOBIC) 7.5 MG tablet Take 1 tablet (7.5 mg total) by mouth daily.   metFORMIN (GLUCOPHAGE) 500 MG tablet Take 1 tablet (500 mg total) by mouth 2 (two) times daily with a meal.   Multiple Vitamins-Minerals (ONE-A-DAY WOMENS PO) Take 1 tablet by mouth daily.   sertraline (ZOLOFT) 25 MG tablet Take 1 tablet (25 mg total) by mouth daily.   traMADol (ULTRAM) 50 MG tablet TAKE 1 TABLET BY MOUTH EVERY 6 HOURS AS NEEDED (Patient taking differently: Take 50 mg by mouth every 6 (six) hours as needed for moderate pain (pain score 4-6) or severe pain (pain score 7-10).)     Allergies:   Patient has no known allergies.   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  Lives with her daughter and two granddaughters.   Social Determinants of Health   Financial Resource Strain: Low Risk  (12/24/2022)   Overall  Financial Resource Strain (CARDIA)    Difficulty of Paying Living Expenses: Not hard at all  Food Insecurity: No Food Insecurity (12/24/2022)   Hunger Vital Sign    Worried About Running Out of Food in the Last Year: Never true    Ran Out of Food in the Last Year: Never true  Transportation Needs: No Transportation Needs (12/24/2022)   PRAPARE - Administrator, Civil Service (Medical): No    Lack of Transportation (Non-Medical): No  Physical Activity: Insufficiently Active (12/24/2022)   Exercise Vital Sign    Days of Exercise per Week: 5 days    Minutes of Exercise per Session: 20 min  Stress: Stress Concern Present (12/24/2022)   [employer] of Occupational Health - Occupational Stress Questionnaire    Feeling of Stress : Rather much  Social Connections: Moderately Isolated (12/24/2022)   Social Connection and Isolation Panel [NHANES]    Frequency of Communication with Friends and Family: More than three times a week    Frequency of Social Gatherings with Friends and Family: Once a week    Attends Religious Services: Never    Database administrator or Organizations: No    Attends Engineer, structural: Not on file    Marital Status: Married     Family History: The patient's family history includes Breast cancer in her maternal grandmother; Cancer in her mother; Cancer (age of onset: 61) in her maternal aunt; Cancer (age of onset: 110) in her maternal grandmother; Heart disease in her father; Hypertension in her brother and father. There is no history of Colon cancer, Colon polyps, Esophageal cancer, Stomach cancer, or Rectal cancer. ROS:   Please see the history of present illness.    All 14 point review of systems negative except as described per history of present illness.  EKGs/Labs/Other Studies Reviewed:    The following studies were reviewed today:   EKG:  EKG Interpretation Date/Time:  Tuesday May 24 2023 15:55:48 EST Ventricular Rate:  89 PR  Interval:  142 QRS Duration:  74 QT Interval:  384 QTC Calculation: 467 R Axis:   -17  Text Interpretation: Normal sinus rhythm Possible Anterior infarct , age undetermined No previous ECGs available Confirmed by Gypsy Balsam [phone]) on 05/24/2023 4:08:40 PM    Recent Labs: 01/03/2023: Magnesium 1.8 05/19/2023: ALT 21; BUN 9; Creatinine 0.83; Hemoglobin 14.9; Platelet Count 304; Potassium 3.5; Sodium 137  Recent Lipid Panel    Component Value Date/Time   CHOL 143 04/14/2023 0845   TRIG 132.0 04/14/2023 0845   HDL 43.50 04/14/2023 0845   CHOLHDL 3 04/14/2023 0845   VLDL 26.4 04/14/2023 0845  LDLCALC 73 04/14/2023 0845   LDLCALC 77 04/02/2020 0908    Physical Exam:    VS:  BP 126/74 (BP Location: Left Arm, Patient Position: Sitting)   Pulse 81   Ht 5\' 5"  (1.651 m)   Wt 203 lb (92.1 kg)   LMP 08/19/2010   SpO2 97%   BMI 33.78 kg/m     Wt Readings from Last 3 Encounters:  05/24/23 203 lb (92.1 kg)  05/19/23 201 lb (91.2 kg)  04/27/23 195 lb (88.5 kg)     GEN:  Well nourished, well developed in no acute distress HEENT: Normal NECK: No JVD; No carotid bruits LYMPHATICS: No lymphadenopathy CARDIAC: RRR, no murmurs, no rubs, no gallops RESPIRATORY:  Clear to auscultation without rales, wheezing or rhonchi  ABDOMEN: Soft, non-tender, non-distended MUSCULOSKELETAL:  No edema; No deformity  SKIN: Warm and dry NEUROLOGIC:  Alert and oriented x 3 PSYCHIATRIC:  Normal affect   ASSESSMENT:    1. Palpitations   2. PVC's (premature ventricular contractions)   3. Cigarette nicotine dependence without complication   4. Dyspnea on exertion    PLAN:    In order of problems listed above:  Palpitations/she does not feel any but she was noted to on the monitor to have some PVCs.  Will put Zio patch to see burden of PVCs.  Part evaluation echocardiogram to be done to assess left ventricle ejection fraction. Cigarette smoking it look like she is determined to quit she  understands she have to quit and I have strong belief that she will accomplish that goal.  I encouraged her to do that. Dyspnea on exertion as a part of her evaluation she will have an echocardiogram done. She is scheduled to have CT of her chest done will wait for results of it if she get any calcification of coronary arteries we will do either stress test or coronary CT angio.   Medication Adjustments/Labs and Tests Ordered: Current medicines are reviewed at length with the patient today.  Concerns regarding medicines are outlined above.  Orders Placed This Encounter  Procedures   EKG 12-Lead   No orders of the defined types were placed in this encounter.   Signed, Georgeanna Lea, MD, [facility] 05/24/2023 4:28 PM    [facility]

## 2023-05-24 NOTE — Patient Instructions (Signed)
Medication Instructions:  Your physician recommends that you continue on your current medications as directed. Please refer to the Current Medication list given to you today.  *If you need a refill on your cardiac medications before your next appointment, please call your pharmacy*   Lab Work: None Ordered If you have labs (blood work) drawn today and your tests are completely normal, you will receive your results only by: MyChart Message (if you have MyChart) OR A paper copy in the mail If you have any lab test that is abnormal or we need to change your treatment, we will call you to review the results.   Testing/Procedures: Your physician has requested that you have an echocardiogram. Echocardiography is a painless test that uses sound waves to create images of your heart. It provides your doctor with information about the size and shape of your heart and how well your heart's chambers and valves are working. This procedure takes approximately one hour. There are no restrictions for this procedure. Please do NOT wear cologne, perfume, aftershave, or lotions (deodorant is allowed). Please arrive 15 minutes prior to your appointment time.   WHY IS MY DOCTOR PRESCRIBING ZIO? The Zio system is proven and trusted by physicians to detect and diagnose irregular heart rhythms -- and has been prescribed to hundreds of thousands of patients.  The FDA has cleared the Zio system to monitor for many different kinds of irregular heart rhythms. In a study, physicians were able to reach a diagnosis 90% of the time with the Zio system1.  You can wear the Zio monitor -- a small, discreet, comfortable patch -- during your normal day-to-day activity, including while you sleep, shower, and exercise, while it records every single heartbeat for analysis.  1Barrett, P., et al. Comparison of 24 Hour Holter Monitoring Versus 14 Day Novel Adhesive Patch Electrocardiographic Monitoring. American Journal of Medicine,  2014.  ZIO VS. HOLTER MONITORING The Zio monitor can be comfortably worn for up to 14 days. Holter monitors can be worn for 24 to 48 hours, limiting the time to record any irregular heart rhythms you may have. Zio is able to capture data for the 51% of patients who have their first symptom-triggered arrhythmia after 48 hours.1  LIVE WITHOUT RESTRICTIONS The Zio ambulatory cardiac monitor is a small, unobtrusive, and water-resistant patch--you might even forget you're wearing it. The Zio monitor records and stores every beat of your heart, whether you're sleeping, working out, or showering.    Please note: We ask at that you not bring children with you during ultrasound (echo/ vascular) testing. Due to room size and safety concerns, children are not allowed in the ultrasound rooms during exams. Our front office staff cannot provide observation of children in our lobby area while testing is being conducted. An adult accompanying a patient to their appointment will only be allowed in the ultrasound room at the discretion of the ultrasound technician under special circumstances. We apologize for any inconvenience.    Follow-Up: At [facility], you and your health needs are our priority.  As part of our continuing mission to provide you with exceptional heart care, we have created designated Provider Care Teams.  These Care Teams include your primary Cardiologist (physician) and Advanced Practice Providers (APPs -  Physician Assistants and Nurse Practitioners) who all work together to provide you with the care you need, when you need it.  We recommend signing up for the patient portal called "MyChart".  Sign up information is provided on this  After Visit Summary.  MyChart is used to connect with patients for Virtual Visits (Telemedicine).  Patients are able to view lab/test results, encounter notes, upcoming appointments, etc.  Non-urgent messages can be sent to your provider as well.   To learn more  about what you can do with MyChart, go to [email or web address].    Your next appointment:   2 month(s)  The format for your next appointment:   In Person  Provider:   Gypsy Balsam, MD    Other Instructions NA

## 2023-05-25 ENCOUNTER — Ambulatory Visit (HOSPITAL_BASED_OUTPATIENT_CLINIC_OR_DEPARTMENT_OTHER): Payer: BC Managed Care – PPO

## 2023-06-03 ENCOUNTER — Encounter (HOSPITAL_BASED_OUTPATIENT_CLINIC_OR_DEPARTMENT_OTHER): Payer: Self-pay

## 2023-06-03 ENCOUNTER — Encounter: Payer: Self-pay | Admitting: Hematology & Oncology

## 2023-06-03 ENCOUNTER — Ambulatory Visit (HOSPITAL_BASED_OUTPATIENT_CLINIC_OR_DEPARTMENT_OTHER)
Admission: RE | Admit: 2023-06-03 | Discharge: 2023-06-03 | Disposition: A | Discharge status: Home / Self Care | Payer: BC Managed Care – PPO | Source: Ambulatory Visit | Attending: Hematology & Oncology | Admitting: Hematology & Oncology

## 2023-06-03 ENCOUNTER — Encounter: Payer: Self-pay | Admitting: Cardiology

## 2023-06-03 DIAGNOSIS — F17210 Nicotine dependence, cigarettes, uncomplicated: Secondary | ICD-10-CM

## 2023-06-06 ENCOUNTER — Other Ambulatory Visit: Payer: Self-pay

## 2023-06-06 DIAGNOSIS — F17210 Nicotine dependence, cigarettes, uncomplicated: Secondary | ICD-10-CM

## 2023-06-07 ENCOUNTER — Other Ambulatory Visit: Payer: Self-pay

## 2023-06-07 DIAGNOSIS — F17210 Nicotine dependence, cigarettes, uncomplicated: Secondary | ICD-10-CM

## 2023-06-08 DIAGNOSIS — I493 Ventricular premature depolarization: Secondary | ICD-10-CM | POA: Diagnosis not present

## 2023-06-08 DIAGNOSIS — R002 Palpitations: Secondary | ICD-10-CM | POA: Diagnosis not present

## 2023-06-10 ENCOUNTER — Telehealth: Payer: Self-pay

## 2023-06-10 MED ORDER — METOPROLOL TARTRATE 25 MG PO TABS: 25.0000 mg | ORAL_TABLET | Freq: Two times a day (BID) | ORAL | 3 refills | Status: DC

## 2023-06-10 NOTE — Telephone Encounter (Signed)
-----   Message from Gypsy Balsam sent at 06/10/2023 11:30 AM EST ----- Episode of ventricular tachycardia noted.  Please start metoprolol titrate 25 twice daily

## 2023-06-10 NOTE — Telephone Encounter (Signed)
Patient notified of results and recommendations and agreed with plan, medication sent.

## 2023-06-13 ENCOUNTER — Encounter: Payer: Self-pay | Admitting: Cardiology

## 2023-06-19 ENCOUNTER — Other Ambulatory Visit: Payer: Self-pay | Admitting: Medical

## 2023-06-19 DIAGNOSIS — M545 Low back pain, unspecified: Secondary | ICD-10-CM

## 2023-06-20 MED ORDER — TRAMADOL HCL 50 MG PO TABS: 50.0000 mg | ORAL_TABLET | Freq: Four times a day (QID) | ORAL | 0 refills | Status: DC | PRN

## 2023-06-20 MED ORDER — CYCLOBENZAPRINE HCL 10 MG PO TABS
ORAL_TABLET | ORAL | 0 refills | Status: DC
Start: 2023-06-20 — End: 2023-09-10

## 2023-06-20 NOTE — Telephone Encounter (Signed)
Rx refill tramadol and flexeril sent to pt pharmacy.

## 2023-06-21 ENCOUNTER — Other Ambulatory Visit: Payer: Self-pay | Admitting: Medical

## 2023-06-21 DIAGNOSIS — M545 Low back pain, unspecified: Secondary | ICD-10-CM

## 2023-06-22 ENCOUNTER — Ambulatory Visit (HOSPITAL_BASED_OUTPATIENT_CLINIC_OR_DEPARTMENT_OTHER)
Admission: RE | Admit: 2023-06-22 | Discharge: 2023-06-22 | Disposition: A | Discharge status: Home / Self Care | Payer: BC Managed Care – PPO | Source: Ambulatory Visit | Attending: Cardiology | Admitting: Cardiology

## 2023-06-22 DIAGNOSIS — R0609 Other forms of dyspnea: Secondary | ICD-10-CM | POA: Diagnosis not present

## 2023-06-22 LAB — ECHOCARDIOGRAM COMPLETE
AR max vel: 2.22 cm2
AV Area VTI: 2.48 cm2
AV Area mean vel: 2.37 cm2
AV Mean grad: 3 mm[Hg]
AV Peak grad: 6.5 mm[Hg]
Ao pk vel: 1.28 m/s
Area-P 1/2: 3.23 cm2
Calc EF: 60.1 %
S' Lateral: 2.4 cm
Single Plane A2C EF: 61.8 %
Single Plane A4C EF: 60.2 %

## 2023-06-28 ENCOUNTER — Telehealth: Payer: Self-pay

## 2023-06-28 NOTE — Telephone Encounter (Signed)
Left message on My Chart with normal results per Dr. Krasowski's note. Routed to PCP. 

## 2023-06-28 NOTE — Telephone Encounter (Signed)
Pt viewed Echo results on My Chart per Dr. Vanetta Shawl note. Routed to PCP.

## 2023-07-10 DIAGNOSIS — B349 Viral infection, unspecified: Secondary | ICD-10-CM | POA: Diagnosis not present

## 2023-07-10 DIAGNOSIS — R062 Wheezing: Secondary | ICD-10-CM | POA: Diagnosis not present

## 2023-07-24 ENCOUNTER — Other Ambulatory Visit: Payer: Self-pay | Admitting: Medical

## 2023-07-25 NOTE — Telephone Encounter (Signed)
 Requesting: tramadol Contract:09/24/22 UDS:09/24/22 Last Visit:04/14/23 Next Visit:n/a Last Refill:06/20/2023  Please Advise

## 2023-07-25 NOTE — Telephone Encounter (Signed)
Rx refill tramadol sent to pharmacy.

## 2023-07-31 ENCOUNTER — Other Ambulatory Visit: Payer: Self-pay | Admitting: Medical

## 2023-08-02 ENCOUNTER — Ambulatory Visit: Payer: BC Managed Care – PPO | Admitting: Cardiology

## 2023-09-09 ENCOUNTER — Other Ambulatory Visit: Payer: Self-pay | Admitting: Family

## 2023-09-09 ENCOUNTER — Other Ambulatory Visit: Payer: Self-pay | Admitting: Medical

## 2023-09-09 DIAGNOSIS — M545 Low back pain, unspecified: Secondary | ICD-10-CM

## 2023-09-10 NOTE — Telephone Encounter (Signed)
 Rx refill sent to phamacy.

## 2023-09-14 ENCOUNTER — Other Ambulatory Visit: Payer: Self-pay | Admitting: Medical

## 2023-09-21 ENCOUNTER — Other Ambulatory Visit: Payer: Self-pay | Admitting: Medical

## 2023-09-21 NOTE — Telephone Encounter (Signed)
 Requesting: tramadol 50mg   Contract: 09/24/22 UDS: 04/14/23 Last Visit: 04/14/23 Next Visit: None Last Refill: 08/01/23 #15 and 0RF   Please Advise

## 2023-09-22 MED ORDER — TRAMADOL HCL 50 MG PO TABS: 50.0000 mg | ORAL_TABLET | Freq: Four times a day (QID) | ORAL | 0 refills | Status: DC | PRN

## 2023-09-22 NOTE — Telephone Encounter (Signed)
 Rx refill sent to pharmacy. Please have pt schedule follow up next month

## 2023-10-19 ENCOUNTER — Ambulatory Visit: Payer: BC Managed Care – PPO | Admitting: Cardiology

## 2023-10-20 DIAGNOSIS — R110 Nausea: Secondary | ICD-10-CM | POA: Diagnosis not present

## 2023-10-20 DIAGNOSIS — R051 Acute cough: Secondary | ICD-10-CM | POA: Diagnosis not present

## 2023-10-20 DIAGNOSIS — R0981 Nasal congestion: Secondary | ICD-10-CM | POA: Diagnosis not present

## 2023-10-20 DIAGNOSIS — B348 Other viral infections of unspecified site: Secondary | ICD-10-CM | POA: Diagnosis not present

## 2023-11-01 ENCOUNTER — Other Ambulatory Visit: Payer: Self-pay | Admitting: Medical

## 2023-11-01 NOTE — Telephone Encounter (Signed)
 Requesting: tramadol Contract:10/25/22 UDS:03/25/23 Last Visit:04/14/23 Next Visit:n/a Last Refill:09/22/23  Please Advise

## 2023-11-02 MED ORDER — TRAMADOL HCL 50 MG PO TABS: 50.0000 mg | ORAL_TABLET | Freq: Four times a day (QID) | ORAL | 0 refills | Status: AC | PRN

## 2023-11-02 NOTE — Telephone Encounter (Signed)
 Pt notified via mychart

## 2023-11-02 NOTE — Telephone Encounter (Signed)
 I just sent in her tramadol. But she also need controlled med visit as more than 6 months since her last visit. Please get her scheduled.

## 2023-11-22 ENCOUNTER — Telehealth: Payer: Self-pay | Admitting: Medical

## 2023-11-22 NOTE — Telephone Encounter (Signed)
 Pt has an appt.

## 2023-11-22 NOTE — Telephone Encounter (Signed)
 Copied from CRM [id number]. 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 Spoke with Adel Holt front desk she stated she will deliver message to Dr. Nurse to get back with the patient. Patient has been advised to wait on a call back.

## 2023-11-23 ENCOUNTER — Ambulatory Visit: Admitting: Medical

## 2023-11-23 VITALS — BP 139/69 | HR 72 | Temp 98.4°F | Resp 16 | Ht 65.0 in | Wt 206.0 lb

## 2023-11-23 DIAGNOSIS — I10 Essential (primary) hypertension: Secondary | ICD-10-CM | POA: Diagnosis not present

## 2023-11-23 DIAGNOSIS — R739 Hyperglycemia, unspecified: Secondary | ICD-10-CM | POA: Diagnosis not present

## 2023-11-23 DIAGNOSIS — Z79899 Other long term (current) drug therapy: Secondary | ICD-10-CM

## 2023-11-23 DIAGNOSIS — J432 Centrilobular emphysema: Secondary | ICD-10-CM

## 2023-11-23 DIAGNOSIS — F17210 Nicotine dependence, cigarettes, uncomplicated: Secondary | ICD-10-CM

## 2023-11-23 DIAGNOSIS — F419 Anxiety disorder, unspecified: Secondary | ICD-10-CM

## 2023-11-23 DIAGNOSIS — M25561 Pain in right knee: Secondary | ICD-10-CM

## 2023-11-23 DIAGNOSIS — Z122 Encounter for screening for malignant neoplasm of respiratory organs: Secondary | ICD-10-CM

## 2023-11-23 DIAGNOSIS — R232 Flushing: Secondary | ICD-10-CM | POA: Diagnosis not present

## 2023-11-23 DIAGNOSIS — F17200 Nicotine dependence, unspecified, uncomplicated: Secondary | ICD-10-CM

## 2023-11-23 DIAGNOSIS — R0683 Snoring: Secondary | ICD-10-CM

## 2023-11-23 DIAGNOSIS — M25562 Pain in left knee: Secondary | ICD-10-CM

## 2023-11-23 DIAGNOSIS — E669 Obesity, unspecified: Secondary | ICD-10-CM

## 2023-11-23 DIAGNOSIS — G8929 Other chronic pain: Secondary | ICD-10-CM

## 2023-11-23 DIAGNOSIS — I2510 Atherosclerotic heart disease of native coronary artery without angina pectoris: Secondary | ICD-10-CM

## 2023-11-23 DIAGNOSIS — E785 Hyperlipidemia, unspecified: Secondary | ICD-10-CM

## 2023-11-23 LAB — COMPREHENSIVE METABOLIC PANEL WITH GFR
ALT: 25 U/L (ref 0–35)
AST: 20 U/L (ref 0–37)
Albumin: 4.4 g/dL (ref 3.5–5.2)
Alkaline Phosphatase: 66 U/L (ref 39–117)
BUN: 15 mg/dL (ref 6–23)
CO2: 23 meq/L (ref 19–32)
Calcium: 9.6 mg/dL (ref 8.4–10.5)
Chloride: 104 meq/L (ref 96–112)
Creatinine, Ser: 0.73 mg/dL (ref 0.40–1.20)
GFR: 89.76 mL/min (ref >60.00)
Glucose, Bld: 94 mg/dL (ref 70–99)
Potassium: 4.3 meq/L (ref 3.5–5.1)
Sodium: 135 meq/L (ref 135–145)
Total Bilirubin: 0.4 mg/dL (ref 0.2–1.2)
Total Protein: 7 g/dL (ref 6.0–8.3)

## 2023-11-23 LAB — HEMOGLOBIN A1C: Hgb A1c MFr Bld: 6.1 % (ref 4.6–6.5)

## 2023-11-23 LAB — FOLLICLE STIMULATING HORMONE: FSH: 45.9 m[IU]/mL

## 2023-11-23 MED ORDER — TRAMADOL HCL 50 MG PO TABS: ORAL_TABLET | ORAL | 0 refills | Status: DC

## 2023-11-23 NOTE — Patient Instructions (Addendum)
 Hypertension, controlled Blood pressure borderline at 139/69 mmHg. Current medications include losartan , hydrochlorothiazide, and metoprolol . - Advise home blood pressure monitoring when relaxed. - Continue current antihypertensive medications.  Prediabetes A1c remains in prediabetic range, average glucose 120 mg/dL. Metformin  can aid in weight loss and control sugar levels. - Recheck A1c to monitor glucose control. - Continue metformin . -if diabetic range on a1c consider ozempic .  Obesity BMI 33. Discussed potential use of GLP-1 receptor agonists like Wegovy , considering no pancreatitis or thyroid  cancer.(no contraindications) - Consider GLP-1 receptor agonist if insurance covers. - Continue diet and exercise regimen.  Osteoarthritis of bilateral knees Chronic knee pain due to osteoarthritis, confirmed by x-rays. Pain exacerbated by exercise. Current management includes tramadol . - Increase tramadol  prescription to 20 tablets per month. - Advise to delay refill until the 14th of the month. - Continue low-impact exercises. -up date contract and uds today  Anxiety, controlled Anxiety well-controlled with sertraline  25 mg daily. - Continue sertraline  25 mg daily.  Tobacco use Smokes half a pack per day. Reordering CT scan for lung cancer screening due to 20 pack-year history. - Reorder CT scan for lung cancer screening.   snoring - referral to pulmonologist for further evaluation.   Follow up date to be determined after lab review.

## 2023-11-23 NOTE — Progress Notes (Signed)
 Subjective:    Patient ID: Laurie Chang, female    DOB: [birth year], 58 y.o.   MRN: [id number]  HPI  Discussed the use of AI scribe software for clinical note transcription with the patient, who gave verbal consent to proceed.  History of Present Illness   Laurie Chang "Laurie Chang" is a 58 year old female with osteoarthritis who presents for a checkup and medication management.  She experiences chronic knee pain due to osteoarthritis, confirmed by x-rays in 2023 showing osteoarthritis in both knees. The pain is intermittent and worsens after exercise, which she has been doing regularly. She currently manages the pain with tramadol  and requests an increase in her prescription to 20 tablets a month due to increased physical activity.  She engages in low-impact workouts for about 25 minutes each morning at home. Despite her efforts in exercising and monitoring her diet by cutting out sugars, she is frustrated with her inability to lose weight. Her BMI is 33, and she is concerned about her weight despite her efforts.  She has a history of anxiety, which is well-controlled with sertraline  at a low dose of 25 mg. She feels much better and does not get upset easily since starting the medication.  Her past medical history includes prediabetes, with an A1c previously in the prediabetic range and an average blood sugar of about 120. She is currently on metformin . Her blood pressure was recorded at 139/69, and she is on losartan , hydrochlorothiazide, and metoprolol . She also has a history of a skipped or extra heartbeat, for which she sees a cardiologist.  Her cholesterol levels are well-controlled with atorvastatin  10 mg daily. She continues to smoke, though she has reduced her intake to half a pack a day.    She reports occasional snoring, particularly when napping in a recliner. She has no history of pancreatitis or family history of thyroid  cancer. She underwent a hysterectomy years  ago and believes she might have one ovary left. She experiences hot flashes occasionally.            Review of Systems  Constitutional:  Negative for chills, fatigue and fever.  HENT:         Snores  Respiratory:  Negative for cough, chest tightness and stridor.   Cardiovascular:  Negative for chest pain and palpitations.  Gastrointestinal:  Negative for abdominal pain, blood in stool, constipation and nausea.  Endocrine:       Occasional hot flashes  Genitourinary:  Negative for dysuria and frequency.  Musculoskeletal:  Negative for back pain, joint swelling and neck stiffness.       Knee pain chronic.  Skin:  Negative for rash.  Neurological:  Negative for dizziness, syncope, weakness and light-headedness.  Hematological:  Negative for adenopathy. Does not bruise/bleed easily.  Psychiatric/Behavioral:  Negative for behavioral problems, dysphoric mood, hallucinations and sleep disturbance.     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  Lives with her daughter and two granddaughters.   Social Drivers of Corporate investment banker Strain: Low Risk  (12/24/2022)   Overall Financial Resource Strain (CARDIA)    Difficulty of Paying Living Expenses: Not hard at all  Food Insecurity: No Food Insecurity (12/24/2022)   Hunger Vital Sign    Worried About Running Out of Food in the Last Year: Never true    Ran Out of Food in the Last Year: Never true  Transportation Needs: No Transportation Needs (12/24/2022)   PRAPARE - Administrator, Civil Service (Medical): No    Lack of Transportation (Non-Medical): No  Physical Activity: Insufficiently Active (12/24/2022)   Exercise Vital Sign    Days of Exercise per Week: 5 days    Minutes of Exercise per Session: 20 min  Stress: Stress Concern Present (12/24/2022)   [employer] of Occupational Health - Occupational Stress Questionnaire    Feeling of Stress : Rather much  Social Connections: Moderately Isolated (12/24/2022)   Social Connection and Isolation Panel [NHANES]    Frequency of Communication with Friends and Family: More than three times a week    Frequency of Social Gatherings with Friends and Family: Once a week    Attends Religious Services: Never    Database administrator or Organizations: No    Attends Engineer, structural: Not on file    Marital Status: Married  Intimate Partner Violence: Unknown (10/21/2021)   Received from [employer], [facility]   HITS    Physically Hurt: Not on file    Insult or Talk Down To: Not on file    Threaten Physical Harm: Not on file    Scream or Curse: Not on file    Past Surgical History:  Procedure Laterality Date   ABDOMINAL HYSTERECTOMY  12/11/10   TAH,RSO   HYSTEROSCOPY  11/2000   HYSTEROSCOPIC MYOMECTOMY   LAPAROSCOPIC CHOLECYSTECTOMY     OOPHORECTOMY  12/11/10   TAH,RSO   TUBAL LIGATION     VEIN SURGERY      Family History  Problem Relation Age of Onset   Cancer Mother         LUNG   Hypertension Father    Heart disease Father    Hypertension Brother    Cancer Maternal Aunt 78       breast   Cancer Maternal Grandmother 34       breast   Breast cancer Maternal Grandmother    Colon cancer Neg Hx    Colon polyps Neg Hx    Esophageal cancer Neg Hx    Stomach cancer Neg Hx    Rectal cancer Neg Hx     No Known Allergies  Current Outpatient Medications on File Prior to Visit  Medication Sig Dispense Refill   atorvastatin  (  LIPITOR) 10 MG tablet Take 1 tablet by mouth once daily 90 tablet 0   cyclobenzaprine  (FLEXERIL ) 10 MG tablet TAKE 1/2 TO 1 (ONE-HALF TO ONE) TABLET BY MOUTH THREE TIMES DAILY AS NEEDED FOR MUSCLE SPASM (Patient taking differently: Take 5-10 mg by mouth 3 (three) times daily as needed for muscle spasms. TAKE 1/2 TO 1 (ONE-HALF TO ONE) TABLET BY MOUTH THREE TIMES DAILY AS NEEDED FOR MUSCLE SPASM) 21 tablet 0   losartan -hydrochlorothiazide (HYZAAR) 100-12.5 MG tablet Take 1 tablet by mouth once daily 90 tablet 0   metFORMIN  (GLUCOPHAGE ) 500 MG tablet Take 1 tablet (500 mg total) by mouth 2 (two) times daily with a meal. 180 tablet 3   Multiple Vitamins-Minerals (ONE-A-DAY WOMENS PO) Take 1 tablet by mouth daily.     sertraline  (ZOLOFT ) 25 MG tablet Take 1 tablet (25 mg total) by mouth daily. 90 tablet 3   traMADol  (ULTRAM ) 50 MG tablet Take 1 tablet (50 mg total) by mouth every 6 (six) hours as needed. 15 tablet 0   VENTOLIN HFA 108 (90 Base) MCG/ACT inhaler Inhale 2 puffs into the lungs every 4 (four) hours as needed for wheezing or shortness of breath.     metoprolol  tartrate (LOPRESSOR ) 25 MG tablet Take 1 tablet (25 mg total) by mouth 2 (two) times daily. 180 tablet 3   No current facility-administered medications on file prior to visit.    BP 139/69   Pulse 72   Temp 98.4 F (36.9 C) (Oral)   Resp 16   Ht 5\' 5"  (1.651 m)   Wt 206 lb (93.4 kg)   LMP 08/19/2010   SpO2 97%   BMI 34.28 kg/m        Objective:   Physical  Exam  General Mental Status- Alert. General Appearance- Not in acute distress.     Neck Carotid Arteries- Normal color. Moisture- Normal Moisture. No carotid bruits. No JVD.  Chest and Lung Exam Auscultation: Breath Sounds:-Normal.  Cardiovascular Auscultation:Rythm- Regular. Murmurs & Other Heart Sounds:Auscultation of the heart reveals- No Murmurs.  Abdomen Inspection:-Inspeection Normal. Palpation/Percussion:Note:No mass. Palpation and Percussion of the abdomen reveal- Non Tender, Non Distended + BS, no rebound or guarding.   Neurologic Cranial Nerve exam:- CN III-XII intact(No nystagmus), symmetric smile. Strength:- 5/5 equal and symmetric strength both upper and lower extremities.   Knees- normal rom today. On exam today mild crepitus on left knee. Rt knee no crepitus appreciated today.    Assessment & Plan:   Assessment and Plan    Hypertension, controlled Blood pressure borderline at 139/69 mmHg. Current medications include losartan , hydrochlorothiazide, and metoprolol . - Advise home blood pressure monitoring when relaxed. - Continue current antihypertensive medications.  Prediabetes A1c remains in prediabetic range, average glucose 120 mg/dL. Metformin  can aid in weight loss and control sugar levels. - Recheck A1c to monitor glucose control. - Continue metformin . -if diabetic range on a1c consider ozempic .  Obesity BMI 33. Discussed potential use of GLP-1 receptor agonists like Wegovy , considering no pancreatitis or thyroid  cancer.(no contraindications) - Consider GLP-1 receptor agonist if insurance covers. - Continue diet and exercise regimen.  Osteoarthritis of bilateral knees Chronic knee pain due to osteoarthritis, confirmed by x-rays. Pain exacerbated by exercise. Current management includes tramadol . - Increase tramadol  prescription to 20 tablets per month. - Advise to delay refill until the 14th of the month. - Continue low-impact  exercises.  Anxiety, controlled Anxiety well-controlled with sertraline  25 mg daily. - Continue sertraline  25 mg daily.  Tobacco use Smokes half  a pack per day. Reordering CT scan for lung cancer screening due to 20 pack-year history. - Reorder CT scan for lung cancer screening.   snoring - referral to pulmonologist for further evaluation.   Follow up date to be determined after lab review.       Tekeisha Hakim, PA-C

## 2023-11-24 ENCOUNTER — Encounter: Payer: Self-pay | Admitting: Medical

## 2023-11-26 LAB — DRUG MONITORING PANEL 376104, URINE
Amphetamines: NEGATIVE ng/mL (ref <500)
Barbiturates: NEGATIVE ng/mL (ref <300)
Benzodiazepines: NEGATIVE ng/mL (ref <100)
Cocaine Metabolite: NEGATIVE ng/mL (ref <150)
Desmethyltramadol: NEGATIVE ng/mL (ref <100)
Opiates: NEGATIVE ng/mL (ref <100)
Oxycodone: NEGATIVE ng/mL (ref <100)
Tramadol: NEGATIVE ng/mL (ref <100)

## 2023-11-26 LAB — DM TEMPLATE

## 2023-11-28 ENCOUNTER — Encounter: Payer: Self-pay | Admitting: Cardiology

## 2023-11-28 ENCOUNTER — Ambulatory Visit: Attending: Cardiology | Admitting: Cardiology

## 2023-11-28 VITALS — BP 114/72 | HR 78 | Ht 65.0 in | Wt 205.0 lb

## 2023-11-28 DIAGNOSIS — I493 Ventricular premature depolarization: Secondary | ICD-10-CM

## 2023-11-28 DIAGNOSIS — I472 Ventricular tachycardia, unspecified: Secondary | ICD-10-CM

## 2023-11-28 DIAGNOSIS — R0609 Other forms of dyspnea: Secondary | ICD-10-CM

## 2023-11-28 DIAGNOSIS — F17210 Nicotine dependence, cigarettes, uncomplicated: Secondary | ICD-10-CM

## 2023-11-28 NOTE — Patient Instructions (Signed)
 Medication Instructions:  Your physician recommends that you continue on your current medications as directed. Please refer to the Current Medication list given to you today.  *If you need a refill on your cardiac medications before your next appointment, please call your pharmacy*  Lab Work: None If you have labs (blood work) drawn today and your tests are completely normal, you will receive your results only by: MyChart Message (if you have MyChart) OR A paper copy in the mail If you have any lab test that is abnormal or we need to change your treatment, we will call you to review the results.  Testing/Procedures: None  Follow-Up: At [facility], you and your health needs are our priority.  As part of our continuing mission to provide you with exceptional heart care, our providers are all part of one team.  This team includes your primary Cardiologist (physician) and Advanced Practice Providers or APPs (Physician Assistants and Nurse Practitioners) who all work together to provide you with the care you need, when you need it.  Your next appointment:   6 month(s)  Provider:   Gypsy Balsam, MD  We recommend signing up for the patient portal called "MyChart".  Sign up information is provided on this After Visit Summary.  MyChart is used to connect with patients for Virtual Visits (Telemedicine).  Patients are able to view lab/test results, encounter notes, upcoming appointments, etc.  Non-urgent messages can be sent to your provider as well.   To learn more about what you can do with MyChart, go to [email or web address].   Other Instructions None

## 2023-11-28 NOTE — Progress Notes (Unsigned)
 Cardiology Office Note:    Date:  11/28/2023   ID:  Laurie Chang, DOB [birth year], MRN [id number]  PCP:  Sylvia Everts, PA-C  Cardiologist:  Ralene Burger, MD    Referring MD: Sylvia Everts, [place]   Chief Complaint  Patient presents with   Follow-up    History of Present Illness:    Laurie Chang is a 58 y.o. female past medical history significant for PVCs, nonsustained ventricular tachycardia noted on the monitor, hyperlipidemia hypertension smoking which is still ongoing.  She was referred to us  because of her ventricular arrhythmia.  Evaluation so far included echocardiogram which showed preserved ejection fraction, she is supposed to have her CT of her chest and will be waiting for results of that she is supposed to have it done in 2 days.  Overall she is doing fine no dizziness no passing out no palpitations, shortness of breath is still there but probably related to smoking.  Past Medical History:  Diagnosis Date   Arthritis    Asthma    as a child    Hyperlipidemia    Hypertension    Leiomyoma uteri    Smoker    1/2 PPD   Syncope and collapse     Past Surgical History:  Procedure Laterality Date   ABDOMINAL HYSTERECTOMY  12/11/10   TAH,RSO   HYSTEROSCOPY  11/2000   HYSTEROSCOPIC MYOMECTOMY   LAPAROSCOPIC CHOLECYSTECTOMY     OOPHORECTOMY  12/11/10   TAH,RSO   TUBAL LIGATION     VEIN SURGERY      Current Medications: Current Meds  Medication Sig   atorvastatin  (LIPITOR) 10 MG tablet Take 1 tablet by mouth once daily   cyclobenzaprine  (FLEXERIL ) 10 MG tablet TAKE 1/2 TO 1 (ONE-HALF TO ONE) TABLET BY MOUTH THREE TIMES DAILY AS NEEDED FOR MUSCLE SPASM (Patient taking differently: Take 5-10 mg by mouth 3 (three) times daily as needed for muscle spasms. TAKE 1/2 TO 1 (ONE-HALF TO ONE) TABLET BY MOUTH THREE TIMES DAILY AS NEEDED FOR MUSCLE SPASM)   losartan -hydrochlorothiazide (HYZAAR) 100-12.5 MG tablet Take 1 tablet by mouth once daily   metFORMIN   (GLUCOPHAGE ) 500 MG tablet Take 1 tablet (500 mg total) by mouth 2 (two) times daily with a meal.   metoprolol  tartrate (LOPRESSOR ) 25 MG tablet Take 1 tablet (25 mg total) by mouth 2 (two) times daily.   Multiple Vitamins-Minerals (ONE-A-DAY WOMENS PO) Take 1 tablet by mouth daily.   sertraline  (ZOLOFT ) 25 MG tablet Take 1 tablet (25 mg total) by mouth daily.   traMADol  (ULTRAM ) 50 MG tablet Take 1 tablet (50 mg total) by mouth every 6 (six) hours as needed. (Patient taking differently: Take 50 mg by mouth every 6 (six) hours as needed for moderate pain (pain score 4-6) or severe pain (pain score 7-10).)   traMADol  (ULTRAM ) 50 MG tablet 1 tab po q 8 hours prn severe knee pain (Patient taking differently: Take 50 mg by mouth every 8 (eight) hours as needed for moderate pain (pain score 4-6) or severe pain (pain score 7-10). 1 tab po q 8 hours prn severe knee pain)   VENTOLIN HFA 108 (90 Base) MCG/ACT inhaler Inhale 2 puffs into the lungs every 4 (four) hours as needed for wheezing or shortness of breath.     Allergies:   Patient has no known allergies.   Social History   Socioeconomic History   Marital status: Married    Spouse name: Not on file   Number of children:  3   Years of education: 19   Highest education level: Some college, no degree  Occupational History   Occupation: Med Theme park manager: Psychologist, prison and probation services LIVING    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  Lives with her daughter  and two granddaughters.   Social Drivers of Corporate investment banker Strain: Low Risk  (12/24/2022)   Overall Financial Resource Strain (CARDIA)    Difficulty of Paying Living Expenses: Not hard at all  Food Insecurity: No Food Insecurity (12/24/2022)   Hunger Vital Sign    Worried About Running Out of Food in the Last Year: Never true    Ran Out of Food in the Last Year: Never true  Transportation Needs: No Transportation Needs (12/24/2022)   PRAPARE - Administrator, Civil Service (Medical): No    Lack of Transportation (Non-Medical): No  Physical Activity: Insufficiently Active (12/24/2022)   Exercise Vital Sign    Days of Exercise per Week: 5 days    Minutes of Exercise per Session: 20 min  Stress: Stress Concern Present (12/24/2022)   [employer] of Occupational Health - Occupational Stress Questionnaire    Feeling of Stress : Rather much  Social Connections: Moderately Isolated (12/24/2022)   Social Connection and Isolation Panel [NHANES]    Frequency of Communication with Friends and Family: More than three times a week    Frequency of Social Gatherings with Friends and Family: Once a week    Attends Religious Services: Never    Database administrator or Organizations: No    Attends Engineer, structural: Not on file    Marital Status: Married     Family History: The patient's family history includes Breast cancer in her maternal grandmother; Cancer in her mother; Cancer (age of onset: 49) in her maternal aunt; Cancer (age of onset: 27) in her maternal grandmother; Heart disease in her father; Hypertension in her brother and father. There is no history of Colon cancer, Colon polyps, Esophageal cancer, Stomach cancer, or Rectal cancer. ROS:   Please see the history of present illness.    All 14 point review of systems negative except as described per history of present illness  EKGs/Labs/Other Studies Reviewed:         Recent Labs: 01/03/2023:  Magnesium 1.8 05/19/2023: Hemoglobin 14.9; Platelet Count 304 11/23/2023: ALT 25; BUN 15; Creatinine, Ser 0.73; Potassium 4.3; Sodium 135  Recent Lipid Panel    Component Value Date/Time   CHOL 143 04/14/2023 0845   TRIG 132.0 04/14/2023 0845   HDL 43.50 04/14/2023 0845   CHOLHDL 3 04/14/2023 0845   VLDL 26.4 04/14/2023 0845   LDLCALC 73 04/14/2023 0845   LDLCALC 77 04/02/2020 0908    Physical Exam:    VS:  BP 114/72 (BP Location: Right Arm, Patient Position: Sitting)   Pulse 78   Ht 5'  5" (1.651 m)   Wt 205 lb (93 kg)   LMP 08/19/2010   SpO2 94%   BMI 34.11 kg/m     Wt Readings from Last 3 Encounters:  11/28/23 205 lb (93 kg)  11/23/23 206 lb (93.4 kg)  05/24/23 203 lb (92.1 kg)     GEN:  Well nourished, well developed in no acute distress HEENT: Normal NECK: No JVD; No carotid bruits LYMPHATICS: No lymphadenopathy CARDIAC: RRR, no murmurs, no rubs, no gallops RESPIRATORY:  Clear to auscultation without rales, wheezing or rhonchi  ABDOMEN: Soft, non-tender, non-distended MUSCULOSKELETAL:  No edema; No deformity  SKIN: Warm and dry LOWER EXTREMITIES: no swelling NEUROLOGIC:  Alert and oriented x 3 PSYCHIATRIC:  Normal affect   ASSESSMENT:    1. PVC's (premature ventricular contractions)   2. Ventricular tachycardia (HCC)   3. Dyspnea on exertion   4. Cigarette nicotine  dependence without complication    PLAN:    In order of problems listed above:  PVCs: Nonsustained ventricular tachycardia: Stable from that point review no dizziness no palpitations tolerating beta-blocker quite nicely.  Ejection fraction preserved, will wait for CT and see if she get any significant calcium  if there is no significant calcium  then we will proceed with coronary CT angio if there is significant calcium  then we do stress test to evaluate for ischemic heart disease. Dyspnea on exertion multifactorial however echocardiogram showed preserved ejection fraction suspect smoking get  something to do with her shortness of breath. Smoking she is working hard trying to quit she understands the problem. Dyslipidemia I did review K PN which show me LDL 73 HDL 43 this is from April 14, 2023, she is on 10 mg of Lipitor which I will continue as well as an aspirin daily.   Medication Adjustments/Labs and Tests Ordered: Current medicines are reviewed at length with the patient today.  Concerns regarding medicines are outlined above.  No orders of the defined types were placed in this encounter.  Medication changes: No orders of the defined types were placed in this encounter.   Signed, Manfred Seed, MD, [facility] 11/28/2023 1:49 PM    [facility]

## 2023-11-29 ENCOUNTER — Encounter: Payer: Self-pay | Admitting: Medical

## 2023-11-30 ENCOUNTER — Ambulatory Visit (HOSPITAL_BASED_OUTPATIENT_CLINIC_OR_DEPARTMENT_OTHER)
Admission: RE | Admit: 2023-11-30 | Discharge: 2023-11-30 | Disposition: A | Discharge status: Home / Self Care | Source: Ambulatory Visit | Attending: Medical | Admitting: Medical

## 2023-11-30 ENCOUNTER — Encounter: Payer: Self-pay | Admitting: Cardiology

## 2023-11-30 DIAGNOSIS — F17210 Nicotine dependence, cigarettes, uncomplicated: Secondary | ICD-10-CM | POA: Diagnosis not present

## 2023-11-30 DIAGNOSIS — F17200 Nicotine dependence, unspecified, uncomplicated: Secondary | ICD-10-CM | POA: Insufficient documentation

## 2023-11-30 DIAGNOSIS — Z122 Encounter for screening for malignant neoplasm of respiratory organs: Secondary | ICD-10-CM | POA: Insufficient documentation

## 2023-12-12 ENCOUNTER — Other Ambulatory Visit: Payer: Self-pay | Admitting: Medical

## 2023-12-19 ENCOUNTER — Ambulatory Visit: Payer: Self-pay | Admitting: Medical

## 2023-12-19 NOTE — Addendum Note (Signed)
 Addended by: Serafina Damme on: 12/19/2023 08:06 PM   Modules accepted: Orders

## 2023-12-19 NOTE — Addendum Note (Signed)
 Addended by: Serafina Damme on: 12/19/2023 08:00 PM   Modules accepted: Orders

## 2023-12-19 NOTE — Telephone Encounter (Signed)
 Spoke with Laurie Chang at the read room , stated she would have to contact IT and report will be in shortly

## 2024-01-05 ENCOUNTER — Other Ambulatory Visit: Payer: Self-pay | Admitting: Medical

## 2024-01-05 ENCOUNTER — Ambulatory Visit: Admitting: Cardiology

## 2024-01-05 NOTE — Telephone Encounter (Signed)
 Requesting: tramadol  Contract:12/15/23 UDS:11/23/23 Last Visit:11/23/23 Next Visit:n/a Last Refill:11/23/23  Please Advise

## 2024-01-06 NOTE — Telephone Encounter (Signed)
 Rx refill sent to pharmacy.

## 2024-01-20 ENCOUNTER — Other Ambulatory Visit: Payer: Self-pay | Admitting: Medical

## 2024-01-27 ENCOUNTER — Ambulatory Visit: Admitting: Pulmonary Disease

## 2024-01-27 ENCOUNTER — Encounter: Payer: Self-pay | Admitting: Pulmonary Disease

## 2024-01-27 VITALS — BP 120/79 | HR 70 | Ht 65.0 in | Wt 206.8 lb

## 2024-01-27 DIAGNOSIS — F17210 Nicotine dependence, cigarettes, uncomplicated: Secondary | ICD-10-CM

## 2024-01-27 DIAGNOSIS — Z6834 Body mass index (BMI) 34.0-34.9, adult: Secondary | ICD-10-CM | POA: Diagnosis not present

## 2024-01-27 DIAGNOSIS — E66811 Obesity, class 1: Secondary | ICD-10-CM | POA: Diagnosis not present

## 2024-01-27 DIAGNOSIS — J432 Centrilobular emphysema: Secondary | ICD-10-CM

## 2024-01-27 NOTE — Progress Notes (Signed)
 Laurie Chang    [id number]    [birth year]  Primary Care Physician:[place], [place] RIGGERS  Referring Physician: Dorina [place], PA-C [address] [place],  [place] [address]  Chief complaint:   Abnormal CT showing bronchiolitis and emphysema  HPI:  Active smoker, down to a pack of cigarettes lasting about 3 days Recently had a low-dose CT scan showing bronchiolitis and emphysema  Over the last month has recently started exercising regularly on elliptical tries to do it on a daily basis for about 20 minutes  More limited with arthritic knees that breathing  Has tried to quit smoking with Chantix , nicotine  patch in the past  Past history of asthma when she was much younger which she felt she grew out of  History of hypertension, hypercholesterolemia  Outpatient Encounter Medications as of 01/27/2024  Medication Sig   atorvastatin  (LIPITOR) 10 MG tablet Take 1 tablet by mouth once daily   cyclobenzaprine  (FLEXERIL ) 10 MG tablet TAKE 1/2 TO 1 (ONE-HALF TO ONE) TABLET BY MOUTH THREE TIMES DAILY AS NEEDED FOR MUSCLE SPASM (Patient taking differently: Take 5-10 mg by mouth 3 (three) times daily as needed for muscle spasms. TAKE 1/2 TO 1 (ONE-HALF TO ONE) TABLET BY MOUTH THREE TIMES DAILY AS NEEDED FOR MUSCLE SPASM)   losartan -hydrochlorothiazide (HYZAAR) 100-12.5 MG tablet Take 1 tablet by mouth once daily   metFORMIN  (GLUCOPHAGE ) 500 MG tablet TAKE 1 TABLET BY MOUTH TWICE DAILY WITH A MEAL   metoprolol  tartrate (LOPRESSOR ) 25 MG tablet Take 1 tablet (25 mg total) by mouth 2 (two) times daily.   Multiple Vitamins-Minerals (ONE-A-DAY WOMENS PO) Take 1 tablet by mouth daily.   sertraline  (ZOLOFT ) 25 MG tablet Take 1 tablet by mouth once daily   traMADol  (ULTRAM ) 50 MG tablet Take 1 tablet (50 mg total) by mouth every 6 (six) hours as needed. (Patient taking differently: Take 50 mg by mouth every 6 (six) hours as needed for moderate pain (pain score 4-6) or  severe pain (pain score 7-10).)   traMADol  (ULTRAM ) 50 MG tablet TAKE 1 TABLET BY MOUTH EVERY 8 HOURS AS NEEDED FOR  SEVEERE  KNEE  PAIN   VENTOLIN HFA 108 (90 Base) MCG/ACT inhaler Inhale 2 puffs into the lungs every 4 (four) hours as needed for wheezing or shortness of breath.   No facility-administered encounter medications on file as of 01/27/2024.    Allergies as of 01/27/2024   (No Known Allergies)    Past Medical History:  Diagnosis Date   Arthritis    Asthma    as a child    Hyperlipidemia    Hypertension    Leiomyoma uteri    Smoker    1/2 PPD   Syncope and collapse     Past Surgical History:  Procedure Laterality Date   ABDOMINAL HYSTERECTOMY  12/11/10   TAH,RSO   HYSTEROSCOPY  11/2000   HYSTEROSCOPIC MYOMECTOMY   LAPAROSCOPIC CHOLECYSTECTOMY     OOPHORECTOMY  12/11/10   TAH,RSO   TUBAL LIGATION     VEIN SURGERY      Family History  Problem Relation Age of Onset   Cancer Mother        LUNG   Hypertension Father    Heart disease Father    Hypertension Brother    Cancer Maternal Aunt 42       breast   Cancer Maternal Grandmother 34       breast   Breast  cancer Maternal Grandmother    Colon cancer Neg Hx    Colon polyps Neg Hx    Esophageal cancer Neg Hx    Stomach cancer Neg Hx    Rectal cancer Neg Hx     Social History   Socioeconomic History   Marital status: Married    Spouse name: Not on file   Number of children: 3   Years of education: 12   Highest education level: Some college, no degree  Occupational History   Occupation: Med Theme park manager: Psychologist, prison and probation services LIVING    Comment: [facility] [employer]  Tobacco Use   Smoking status: Every Day    Current packs/day: 0.50    Average packs/day: 0.5 packs/day for 30.0 years (15.0 ttl pk-yrs)    Types: Cigarettes   Smokeless tobacco: Never   Tobacco comments:    A pack last 3 days   Vaping Use   Vaping status: Never Used  Substance and Sexual Activity   Alcohol use: Not  Currently    Comment: once in a blue moon   Drug use: No   Sexual activity: Yes    Partners: Male    Birth control/protection: Surgical  Other Topics Concern   Not on file  Social History Narrative   Widowed in 2012.  Lives with her daughter and two granddaughters.   Social Drivers of Corporate investment banker Strain: Low Risk  (12/24/2022)   Overall Financial Resource Strain (CARDIA)    Difficulty of Paying Living Expenses: Not hard at all  Food Insecurity: No Food Insecurity (12/24/2022)   Hunger Vital Sign    Worried About Running Out of Food in the Last Year: Never true    Ran Out of Food in the Last Year: Never true  Transportation Needs: No Transportation Needs (12/24/2022)   PRAPARE - Administrator, Civil Service (Medical): No    Lack of Transportation (Non-Medical): No  Physical Activity: Insufficiently Active (12/24/2022)   Exercise Vital Sign    Days of Exercise per Week: 5 days    Minutes of Exercise per Session: 20 min  Stress: Stress Concern Present (12/24/2022)   [employer] of Occupational Health - Occupational Stress Questionnaire    Feeling of Stress : Rather much  Social Connections: Moderately Isolated (12/24/2022)   Social Connection and Isolation Panel    Frequency of Communication with Friends and Family: More than three times a week    Frequency of Social Gatherings with Friends and Family: Once a week    Attends Religious Services: Never    Database administrator or Organizations: No    Attends Engineer, structural: Not on file    Marital Status: Married  Intimate Partner Violence: Unknown (10/21/2021)   Received from [facility]   HITS    Physically Hurt: Not on file    Insult or Talk Down To: Not on file    Threaten Physical Harm: Not on file    Scream or Curse: Not on file    Review of Systems  Respiratory:  Positive for shortness of breath.     Vitals:   01/27/24 1036  BP: 120/79  Pulse: 70  SpO2: 96%      Physical Exam Constitutional:      Appearance: She is obese.  HENT:     Head: Normocephalic.     Nose: No congestion.  Eyes:     General: No scleral icterus. Cardiovascular:  Rate and Rhythm: Normal rate and regular rhythm.     Heart sounds: No murmur heard.    No friction rub.  Pulmonary:     Effort: No respiratory distress.     Breath sounds: No stridor. No wheezing or rhonchi.  Musculoskeletal:     Cervical back: No rigidity or tenderness.  Neurological:     Mental Status: She is alert.  Psychiatric:        Mood and Affect: Mood normal.    Data Reviewed: CT scan of the chest 11/30/2023 reviewed by myself  Assessment:  Emphysema, bronchiolitis on recent CT scan  Active smoker  Class I obesity  Plan/Recommendations: Smoking cessation counseling  Schedule for pulmonary function test  Follow-up in about 2 to 3 months  Encouraged to continue graded activities as tolerated  Continue yearly low-dose screening   Jennet Epley MD [facility] 01/27/2024, 10:44 AM  CC: Dorina Loving, PA-C

## 2024-01-27 NOTE — Patient Instructions (Addendum)
 Schedule for pulmonary function test  Continue to work on quitting smoking  The book is Easy way to stop smoking by Dasie Myron  Continue regular graded exercises as tolerated   I do not believe we need to start on any inhaler at present  The risk associated with smoking does decrease over time once you have quit  Follow-up in about 2 to 3 months

## 2024-01-31 ENCOUNTER — Encounter: Payer: Self-pay | Admitting: Cardiology

## 2024-01-31 ENCOUNTER — Ambulatory Visit: Attending: Cardiology | Admitting: Cardiology

## 2024-01-31 VITALS — BP 124/70 | HR 74 | Ht 65.0 in | Wt 208.8 lb

## 2024-01-31 DIAGNOSIS — F17210 Nicotine dependence, cigarettes, uncomplicated: Secondary | ICD-10-CM

## 2024-01-31 DIAGNOSIS — I493 Ventricular premature depolarization: Secondary | ICD-10-CM | POA: Diagnosis not present

## 2024-01-31 DIAGNOSIS — I472 Ventricular tachycardia, unspecified: Secondary | ICD-10-CM | POA: Diagnosis not present

## 2024-01-31 DIAGNOSIS — R931 Abnormal findings on diagnostic imaging of heart and coronary circulation: Secondary | ICD-10-CM

## 2024-01-31 MED ORDER — METOPROLOL SUCCINATE ER 50 MG PO TB24: 50.0000 mg | ORAL_TABLET | Freq: Every day | ORAL | 3 refills | Status: AC

## 2024-01-31 NOTE — Progress Notes (Signed)
 Cardiology Office Note:    Date:  01/31/2024   ID:  Laurie Chang, DOB Aug 14, 1965, MRN [id number]  PCP:  Dorina Loving, PA-C  Cardiologist:  Lamar Fitch, MD    Referring MD: Dorina Loving, [place]   Chief Complaint  Patient presents with   Results    History of Present Illness:    Laurie Chang is a 58 y.o. female past medical history significant for PVCs, nonsustained ventricular tachycardia noted on the monitor, hyperlipidemia, hypertension, smoking which is still ongoing.  We are trying to stratify her arrhythmia meaning ventricle tachycardia she did have echocardiogram which showed preserved left ventricle ejection fraction.  She is post to have CT of her chest done eventually she ended up having the test done show aged advanced calcification of coronary arteries she comes here to talk about this likely she does not have any typical symptoms she is getting short of breath but no chest pain tightness squeezing pressure in chest.  Past Medical History:  Diagnosis Date   Arthritis    Asthma    as a child    Hyperlipidemia    Hypertension    Leiomyoma uteri    Smoker    1/2 PPD   Syncope and collapse     Past Surgical History:  Procedure Laterality Date   ABDOMINAL HYSTERECTOMY  12/11/10   TAH,RSO   HYSTEROSCOPY  11/2000   HYSTEROSCOPIC MYOMECTOMY   LAPAROSCOPIC CHOLECYSTECTOMY     OOPHORECTOMY  12/11/10   TAH,RSO   TUBAL LIGATION     VEIN SURGERY      Current Medications: Current Meds  Medication Sig   atorvastatin  (LIPITOR) 10 MG tablet Take 1 tablet by mouth once daily   cyclobenzaprine  (FLEXERIL ) 10 MG tablet TAKE 1/2 TO 1 (ONE-HALF TO ONE) TABLET BY MOUTH THREE TIMES DAILY AS NEEDED FOR MUSCLE SPASM (Patient taking differently: Take 5-10 mg by mouth 3 (three) times daily as needed for muscle spasms. TAKE 1/2 TO 1 (ONE-HALF TO ONE) TABLET BY MOUTH THREE TIMES DAILY AS NEEDED FOR MUSCLE SPASM)   losartan -hydrochlorothiazide (HYZAAR) 100-12.5 MG  tablet Take 1 tablet by mouth once daily   metFORMIN  (GLUCOPHAGE ) 500 MG tablet TAKE 1 TABLET BY MOUTH TWICE DAILY WITH A MEAL   metoprolol  tartrate (LOPRESSOR ) 25 MG tablet Take 1 tablet (25 mg total) by mouth 2 (two) times daily.   Multiple Vitamins-Minerals (ONE-A-DAY WOMENS PO) Take 1 tablet by mouth daily.   sertraline  (ZOLOFT ) 25 MG tablet Take 1 tablet by mouth once daily   traMADol  (ULTRAM ) 50 MG tablet Take 1 tablet (50 mg total) by mouth every 6 (six) hours as needed. (Patient taking differently: Take 50 mg by mouth every 6 (six) hours as needed for moderate pain (pain score 4-6) or severe pain (pain score 7-10).)   traMADol  (ULTRAM ) 50 MG tablet TAKE 1 TABLET BY MOUTH EVERY 8 HOURS AS NEEDED FOR  SEVEERE  KNEE  PAIN   VENTOLIN HFA 108 (90 Base) MCG/ACT inhaler Inhale 2 puffs into the lungs every 4 (four) hours as needed for wheezing or shortness of breath.     Allergies:   Patient has no known allergies.   Social History   Socioeconomic History   Marital status: Married    Spouse name: Not on file   Number of children: 3   Years of education: 12   Highest education level: Some college, no degree  Occupational History   Occupation: Med Theme park manager: Psychologist, prison and probation services LIVING  Comment: [employer]  Tobacco Use   Smoking status: Every Day    Current packs/day: 0.50    Average packs/day: 0.5 packs/day for 30.0 years (15.0 ttl pk-yrs)    Types: Cigarettes   Smokeless tobacco: Never   Tobacco comments:    A pack last 3 days   Vaping Use   Vaping status: Never Used  Substance and Sexual Activity   Alcohol use: Not Currently    Comment: once in a blue moon   Drug use: No   Sexual activity: Yes    Partners: Male    Birth control/protection: Surgical  Other Topics Concern   Not on file  Social History Narrative   Widowed in 2012.  Lives with her daughter and two granddaughters.   Social Drivers of Corporate investment banker Strain: Low Risk   (12/24/2022)   Overall Financial Resource Strain (CARDIA)    Difficulty of Paying Living Expenses: Not hard at all  Food Insecurity: No Food Insecurity (12/24/2022)   Hunger Vital Sign    Worried About Running Out of Food in the Last Year: Never true    Ran Out of Food in the Last Year: Never true  Transportation Needs: No Transportation Needs (12/24/2022)   PRAPARE - Administrator, Civil Service (Medical): No    Lack of Transportation (Non-Medical): No  Physical Activity: Insufficiently Active (12/24/2022)   Exercise Vital Sign    Days of Exercise per Week: 5 days    Minutes of Exercise per Session: 20 min  Stress: Stress Concern Present (12/24/2022)   [employer] of Occupational Health - Occupational Stress Questionnaire    Feeling of Stress : Rather much  Social Connections: Moderately Isolated (12/24/2022)   Social Connection and Isolation Panel    Frequency of Communication with Friends and Family: More than three times a week    Frequency of Social Gatherings with Friends and Family: Once a week    Attends Religious Services: Never    Database administrator or Organizations: No    Attends Engineer, structural: Not on file    Marital Status: Married     Family History: The patient's family history includes Breast cancer in her maternal grandmother; Cancer in her mother; Cancer (age of onset: 30) in her maternal aunt; Cancer (age of onset: 71) in her maternal grandmother; Heart disease in her father; Hypertension in her brother and father. There is no history of Colon cancer, Colon polyps, Esophageal cancer, Stomach cancer, or Rectal cancer. ROS:   Please see the history of present illness.    All 14 point review of systems negative except as described per history of present illness  EKGs/Labs/Other Studies Reviewed:         Recent Labs: 05/19/2023: Hemoglobin 14.9; Platelet Count 304 11/23/2023: ALT 25; BUN 15; Creatinine, Ser 0.73; Potassium 4.3; Sodium 135   Recent Lipid Panel    Component Value Date/Time   CHOL 143 04/14/2023 0845   TRIG 132.0 04/14/2023 0845   HDL 43.50 04/14/2023 0845   CHOLHDL 3 04/14/2023 0845   VLDL 26.4 04/14/2023 0845   LDLCALC 73 04/14/2023 0845   LDLCALC 77 04/02/2020 0908    Physical Exam:    VS:  BP 124/70 (BP Location: Right Arm, Patient Position: Sitting)   Pulse 74   Ht 5' 5 (1.651 m)   Wt 208 lb 12.8 oz (94.7 kg)   LMP 08/19/2010   SpO2 95%   BMI 34.75 kg/m  Wt Readings from Last 3 Encounters:  01/31/24 208 lb 12.8 oz (94.7 kg)  01/27/24 206 lb 12.8 oz (93.8 kg)  11/28/23 205 lb (93 kg)     GEN:  Well nourished, well developed in no acute distress HEENT: Normal NECK: No JVD; No carotid bruits LYMPHATICS: No lymphadenopathy CARDIAC: RRR, no murmurs, no rubs, no gallops RESPIRATORY:  Clear to auscultation without rales, wheezing or rhonchi  ABDOMEN: Soft, non-tender, non-distended MUSCULOSKELETAL:  No edema; No deformity  SKIN: Warm and dry LOWER EXTREMITIES: no swelling NEUROLOGIC:  Alert and oriented x 3 PSYCHIATRIC:  Normal affect   ASSESSMENT:    1. Ventricular tachycardia (HCC)   2. PVC's (premature ventricular contractions)   3. Cigarette nicotine  dependence without complication   4. Elevated coronary artery calcium  score    PLAN:    In order of problems listed above:  Ventricular tachycardia trying to stratify her arrhythmia.  Will schedule her to have exercise Cardiolite. PVCs/ventricular tachycardia on metoprolol  which I continue no dizziness no palpitations with it. Smoking we had a long discussion about that she is cutting down significantly she smokes 1 pack every 3 days now and she determined August 8 will be due that she will quit. Calcification of the coronary arteries plan as described above. Dyslipidemia   Medication Adjustments/Labs and Tests Ordered: Current medicines are reviewed at length with the patient today.  Concerns regarding medicines are  outlined above.  No orders of the defined types were placed in this encounter.  Medication changes: No orders of the defined types were placed in this encounter.   Signed, Lamar DOROTHA Fitch, MD, [facility] 01/31/2024 4:41 PM    [facility]

## 2024-01-31 NOTE — Patient Instructions (Addendum)
 Medication Instructions:   START: Metoprolol  Succinate 50mg  1 tablet daily   Lab Work: None Ordered If you have labs (blood work) drawn today and your tests are completely normal, you will receive your results only by: MyChart Message (if you have MyChart) OR A paper copy in the mail If you have any lab test that is abnormal or we need to change your treatment, we will call you to review the results.   Testing/Procedures: Your physician has requested that you have a lexiscan myoview. For further information please visit [email or web address] Please follow instruction sheet, as given.  The test will take approximately 3 to 4 hours to complete; you may bring reading material.  If someone comes with you to your appointment, they will need to remain in the main lobby due to limited space in the testing area. **If you are pregnant or breastfeeding, please notify the nuclear lab prior to your appointment**  How to prepare for your Myocardial Perfusion Test: Do not eat or drink 3 hours prior to your test, except you may have water. Do not consume products containing caffeine (regular or decaffeinated) 12 hours prior to your test. (ex: coffee, chocolate, sodas, tea). Do bring a list of your current medications with you.  If not listed below, you may take your medications as normal. Do wear comfortable clothes (no dresses or overalls) and walking shoes, tennis shoes preferred (No heels or open toe shoes are allowed). Do NOT wear cologne, perfume, aftershave, or lotions (deodorant is allowed). If these instructions are not followed, your test will have to be rescheduled.     Follow-Up: At [facility], you and your health needs are our priority.  As part of our continuing mission to provide you with exceptional heart care, we have created designated Provider Care Teams.  These Care Teams include your primary Cardiologist (physician) and Advanced Practice Providers (APPs -  Physician Assistants  and Nurse Practitioners) who all work together to provide you with the care you need, when you need it.  We recommend signing up for the patient portal called MyChart.  Sign up information is provided on this After Visit Summary.  MyChart is used to connect with patients for Virtual Visits (Telemedicine).  Patients are able to view lab/test results, encounter notes, upcoming appointments, etc.  Non-urgent messages can be sent to your provider as well.   To learn more about what you can do with MyChart, go to [email or web address].    Your next appointment:   4 month(s)  The format for your next appointment:   In Person  Provider:   Lamar Fitch, MD    Other Instructions NA

## 2024-02-09 ENCOUNTER — Other Ambulatory Visit: Payer: Self-pay | Admitting: Medical

## 2024-02-09 NOTE — Telephone Encounter (Signed)
 Requesting: tramadol  50mg   Contract: 11/23/23 UDS: 11/23/23 Last Visit: 11/23/23 Next Visit: None Last Refill: 01/06/24 #20 and 0RF   Please Advise

## 2024-02-14 ENCOUNTER — Encounter

## 2024-02-14 ENCOUNTER — Telehealth: Payer: Self-pay | Admitting: *Deleted

## 2024-02-14 NOTE — Telephone Encounter (Signed)
 Pt given insrtructions for MPI study.

## 2024-02-15 ENCOUNTER — Ambulatory Visit: Admitting: Primary Care

## 2024-02-21 ENCOUNTER — Ambulatory Visit: Attending: Cardiology

## 2024-02-21 DIAGNOSIS — I472 Ventricular tachycardia, unspecified: Secondary | ICD-10-CM

## 2024-02-21 LAB — MYOCARDIAL PERFUSION IMAGING
Angina Index: 0
Duke Treadmill Score: 5
Estimated workload: 7
Exercise duration (min): 4 min
Exercise duration (sec): 55 s
LV dias vol: 70 mL (ref 46–106)
LV sys vol: 25 mL (ref 3.8–5.2)
MPHR: 160 {beats}/min
Nuc Stress EF: 64 %
Peak HR: 155 {beats}/min
Percent HR: 96 %
Rest HR: 67 {beats}/min
Rest Nuclear Isotope Dose: 10.9 mCi
SDS: 0
SRS: 0
SSS: 0
ST Depression (mm): 0 mm
Stress Nuclear Isotope Dose: 30.3 mCi
TID: 1.07

## 2024-02-21 MED ORDER — TECHNETIUM TC 99M TETROFOSMIN IV KIT
30.3000 | PACK | Freq: Once | INTRAVENOUS | Status: AC | PRN
  Administered 2024-02-21: 30.3 via INTRAVENOUS

## 2024-02-21 MED ORDER — TECHNETIUM TC 99M TETROFOSMIN IV KIT
10.9000 | PACK | Freq: Once | INTRAVENOUS | Status: AC | PRN
  Administered 2024-02-21: 10.9 via INTRAVENOUS

## 2024-02-22 ENCOUNTER — Ambulatory Visit: Payer: Self-pay | Admitting: Cardiology

## 2024-02-27 ENCOUNTER — Telehealth: Payer: Self-pay

## 2024-02-27 NOTE — Telephone Encounter (Signed)
 Pt viewed Stress Test results on My Chart per Dr. Vanetta Shawl note. Routed to PCP.

## 2024-02-27 NOTE — Telephone Encounter (Signed)
 Left message on My Chart with Stress Test results per Dr. Karry note. Routed to PCP.

## 2024-03-11 ENCOUNTER — Other Ambulatory Visit: Payer: Self-pay | Admitting: Medical

## 2024-03-28 ENCOUNTER — Other Ambulatory Visit: Payer: Self-pay | Admitting: Medical

## 2024-03-28 DIAGNOSIS — Z1231 Encounter for screening mammogram for malignant neoplasm of breast: Secondary | ICD-10-CM

## 2024-04-03 ENCOUNTER — Other Ambulatory Visit: Payer: Self-pay | Admitting: Medical Genetics

## 2024-04-10 ENCOUNTER — Ambulatory Visit
Admission: RE | Admit: 2024-04-10 | Discharge: 2024-04-10 | Disposition: A | Discharge status: Home / Self Care | Source: Ambulatory Visit | Attending: Medical | Admitting: Medical

## 2024-04-10 DIAGNOSIS — Z1231 Encounter for screening mammogram for malignant neoplasm of breast: Secondary | ICD-10-CM

## 2024-04-12 ENCOUNTER — Ambulatory Visit: Payer: Self-pay | Admitting: Medical

## 2024-04-12 DIAGNOSIS — D259 Leiomyoma of uterus, unspecified: Secondary | ICD-10-CM | POA: Diagnosis not present

## 2024-04-12 DIAGNOSIS — E7800 Pure hypercholesterolemia, unspecified: Secondary | ICD-10-CM | POA: Diagnosis not present

## 2024-04-12 DIAGNOSIS — N951 Menopausal and female climacteric states: Secondary | ICD-10-CM | POA: Diagnosis not present

## 2024-04-12 DIAGNOSIS — R6882 Decreased libido: Secondary | ICD-10-CM | POA: Diagnosis not present

## 2024-04-19 ENCOUNTER — Other Ambulatory Visit: Payer: Self-pay | Admitting: Medical

## 2024-04-26 ENCOUNTER — Other Ambulatory Visit

## 2024-04-30 ENCOUNTER — Ambulatory Visit: Payer: Self-pay | Admitting: Medical

## 2024-04-30 ENCOUNTER — Ambulatory Visit: Admitting: *Deleted

## 2024-04-30 ENCOUNTER — Encounter: Payer: Self-pay | Admitting: Pulmonary Disease

## 2024-04-30 ENCOUNTER — Ambulatory Visit: Admitting: Medical

## 2024-04-30 ENCOUNTER — Ambulatory Visit: Admitting: Pulmonary Disease

## 2024-04-30 VITALS — BP 112/80 | HR 67 | Temp 98.0°F | Resp 15 | Ht 65.0 in | Wt 206.6 lb

## 2024-04-30 VITALS — BP 108/71 | HR 80 | Ht 65.0 in | Wt 207.6 lb

## 2024-04-30 DIAGNOSIS — R739 Hyperglycemia, unspecified: Secondary | ICD-10-CM

## 2024-04-30 DIAGNOSIS — M25561 Pain in right knee: Secondary | ICD-10-CM | POA: Diagnosis not present

## 2024-04-30 DIAGNOSIS — I10 Essential (primary) hypertension: Secondary | ICD-10-CM | POA: Diagnosis not present

## 2024-04-30 DIAGNOSIS — J432 Centrilobular emphysema: Secondary | ICD-10-CM

## 2024-04-30 DIAGNOSIS — Z130 Encounter for screening for diseases of the blood and blood-forming organs and certain disorders involving the immune mechanism: Secondary | ICD-10-CM | POA: Diagnosis not present

## 2024-04-30 DIAGNOSIS — R7303 Prediabetes: Secondary | ICD-10-CM | POA: Diagnosis not present

## 2024-04-30 DIAGNOSIS — R635 Abnormal weight gain: Secondary | ICD-10-CM | POA: Diagnosis not present

## 2024-04-30 DIAGNOSIS — Z0000 Encounter for general adult medical examination without abnormal findings: Secondary | ICD-10-CM | POA: Diagnosis not present

## 2024-04-30 DIAGNOSIS — M25562 Pain in left knee: Secondary | ICD-10-CM

## 2024-04-30 DIAGNOSIS — Z1329 Encounter for screening for other suspected endocrine disorder: Secondary | ICD-10-CM | POA: Diagnosis not present

## 2024-04-30 DIAGNOSIS — N951 Menopausal and female climacteric states: Secondary | ICD-10-CM | POA: Diagnosis not present

## 2024-04-30 DIAGNOSIS — F419 Anxiety disorder, unspecified: Secondary | ICD-10-CM | POA: Diagnosis not present

## 2024-04-30 DIAGNOSIS — G8929 Other chronic pain: Secondary | ICD-10-CM

## 2024-04-30 DIAGNOSIS — E7800 Pure hypercholesterolemia, unspecified: Secondary | ICD-10-CM | POA: Diagnosis not present

## 2024-04-30 LAB — COMPREHENSIVE METABOLIC PANEL WITH GFR
ALT: 23 U/L (ref 0–35)
AST: 18 U/L (ref 0–37)
Albumin: 4.4 g/dL (ref 3.5–5.2)
Alkaline Phosphatase: 72 U/L (ref 39–117)
BUN: 10 mg/dL (ref 6–23)
CO2: 27 meq/L (ref 19–32)
Calcium: 9.3 mg/dL (ref 8.4–10.5)
Chloride: 103 meq/L (ref 96–112)
Creatinine, Ser: 0.85 mg/dL (ref 0.40–1.20)
GFR: 74.55 mL/min (ref >60.00)
Glucose, Bld: 94 mg/dL (ref 70–99)
Potassium: 4.4 meq/L (ref 3.5–5.1)
Sodium: 137 meq/L (ref 135–145)
Total Bilirubin: 0.6 mg/dL (ref 0.2–1.2)
Total Protein: 6.9 g/dL (ref 6.0–8.3)

## 2024-04-30 LAB — LIPID PANEL
Cholesterol: 139 mg/dL (ref 0–200)
HDL: 36 mg/dL — ABNORMAL LOW (ref >39.00)
LDL Cholesterol: 70 mg/dL (ref 0–99)
NonHDL: 102.91
Total CHOL/HDL Ratio: 4
Triglycerides: 165 mg/dL — ABNORMAL HIGH (ref 0.0–149.0)
VLDL: 33 mg/dL (ref 0.0–40.0)

## 2024-04-30 LAB — PULMONARY FUNCTION TEST
DL/VA % pred: 108 %
DL/VA: 4.54 ml/min/mmHg/L
DLCO cor % pred: 67 %
DLCO cor: 14.02 ml/min/mmHg
DLCO unc % pred: 67 %
DLCO unc: 14.02 ml/min/mmHg
FEF 25-75 Post: 2.22 L/s
FEF 25-75 Pre: 2.38 L/s
FEF2575-%Change-Post: -6 %
FEF2575-%Pred-Post: 92 %
FEF2575-%Pred-Pre: 98 %
FEV1-%Change-Post: 0 %
FEV1-%Pred-Post: 77 %
FEV1-%Pred-Pre: 76 %
FEV1-Post: 2.05 L
FEV1-Pre: 2.04 L
FEV1FVC-%Change-Post: 0 %
FEV1FVC-%Pred-Pre: 104 %
FEV6-%Change-Post: 1 %
FEV6-%Pred-Post: 76 %
FEV6-%Pred-Pre: 75 %
FEV6-Post: 2.55 L
FEV6-Pre: 2.5 L
FEV6FVC-%Change-Post: 0 %
FEV6FVC-%Pred-Post: 103 %
FEV6FVC-%Pred-Pre: 103 %
FVC-%Change-Post: 1 %
FVC-%Pred-Post: 74 %
FVC-%Pred-Pre: 73 %
FVC-Post: 2.55 L
FVC-Pre: 2.51 L
Post FEV1/FVC ratio: 81 %
Post FEV6/FVC ratio: 100 %
Pre FEV1/FVC ratio: 81 %
Pre FEV6/FVC Ratio: 100 %
RV % pred: 110 %
RV: 2.26 L
TLC % pred: 90 %
TLC: 4.73 L

## 2024-04-30 LAB — CBC WITH DIFFERENTIAL/PLATELET
Basophils Absolute: 0.1 K/uL (ref 0.0–0.1)
Basophils Relative: 1 % (ref 0.0–3.0)
Eosinophils Absolute: 0.1 K/uL (ref 0.0–0.7)
Eosinophils Relative: 1.4 % (ref 0.0–5.0)
HCT: 47.8 % — ABNORMAL HIGH (ref 36.0–46.0)
Hemoglobin: 15.7 g/dL — ABNORMAL HIGH (ref 12.0–15.0)
Lymphocytes Relative: 43.1 % (ref 12.0–46.0)
Lymphs Abs: 3.7 K/uL (ref 0.7–4.0)
MCHC: 32.9 g/dL (ref 30.0–36.0)
MCV: 92.8 fl (ref 78.0–100.0)
Monocytes Absolute: 0.4 K/uL (ref 0.1–1.0)
Monocytes Relative: 4.9 % (ref 3.0–12.0)
Neutro Abs: 4.3 K/uL (ref 1.4–7.7)
Neutrophils Relative %: 49.6 % (ref 43.0–77.0)
Platelets: 350 K/uL (ref 150.0–400.0)
RBC: 5.15 Mil/uL — ABNORMAL HIGH (ref 3.87–5.11)
RDW: 13.1 % (ref 11.5–15.5)
WBC: 8.6 K/uL (ref 4.0–10.5)

## 2024-04-30 LAB — HEMOGLOBIN A1C: Hgb A1c MFr Bld: 6.4 % (ref 4.6–6.5)

## 2024-04-30 NOTE — Patient Instructions (Signed)
 Full PFT performed today.

## 2024-04-30 NOTE — Progress Notes (Signed)
 Full PFT performed today.

## 2024-04-30 NOTE — Patient Instructions (Addendum)
 For you wellness exam today I have ordered cbc, cmp, A1c and lipid panel.   Flu vaccine today.   Mammogram up to date. Hx of hysterecomy   Recommend exercise and healthy diet.   We will let you know lab results as they come in.   Follow up early May uds and controlled med viist.   Chronic knee pain. Up to date on contract and uds. Continue meloxicam  and tramadol . Rx advisement given.   Htn-  controlled. pt is on losartan  100-12.5 mg daily.  Anxiety- controlled with sertraline .  Preventive Care 60-21 Years Old, Female Preventive care refers to lifestyle choices and visits with your health care provider that can promote health and wellness. Preventive care visits are also called wellness exams. What can I expect for my preventive care visit? Counseling Your health care provider may ask you questions about your: Medical history, including: Past medical problems. Family medical history. Pregnancy history. Current health, including: Menstrual cycle. Method of birth control. Emotional well-being. Home life and relationship well-being. Sexual activity and sexual health. Lifestyle, including: Alcohol, nicotine  or tobacco, and drug use. Access to firearms. Diet, exercise, and sleep habits. Work and work Astronomer. Sunscreen use. Safety issues such as seatbelt and bike helmet use. Physical exam Your health care provider will check your: Height and weight. These may be used to calculate your BMI (body mass index). BMI is a measurement that tells if you are at a healthy weight. Waist circumference. This measures the distance around your waistline. This measurement also tells if you are at a healthy weight and may help predict your risk of certain diseases, such as type 2 diabetes and high blood pressure. Heart rate and blood pressure. Body temperature. Skin for abnormal spots. What immunizations do I need?  Vaccines are usually given at various ages, according to a schedule.  Your health care provider will recommend vaccines for you based on your age, medical history, and lifestyle or other factors, such as travel or where you work. What tests do I need? Screening Your health care provider may recommend screening tests for certain conditions. This may include: Lipid and cholesterol levels. Diabetes screening. This is done by checking your blood sugar (glucose) after you have not eaten for a while (fasting). Pelvic exam and Pap test. Hepatitis B test. Hepatitis C test. HIV (human immunodeficiency virus) test. STI (sexually transmitted infection) testing, if you are at risk. Lung cancer screening. Colorectal cancer screening. Mammogram. Talk with your health care provider about when you should start having regular mammograms. This may depend on whether you have a family history of breast cancer. BRCA-related cancer screening. This may be done if you have a family history of breast, ovarian, tubal, or peritoneal cancers. Bone density scan. This is done to screen for osteoporosis. Talk with your health care provider about your test results, treatment options, and if necessary, the need for more tests. Follow these instructions at home: Eating and drinking  Eat a diet that includes fresh fruits and vegetables, whole grains, lean protein, and low-fat dairy products. Take vitamin and mineral supplements as recommended by your health care provider. Do not drink alcohol if: Your health care provider tells you not to drink. You are pregnant, may be pregnant, or are planning to become pregnant. If you drink alcohol: Limit how much you have to 0-1 drink a day. Know how much alcohol is in your drink. In the [place] one drink equals one 12 oz bottle of beer (355 mL), one 5 oz  glass of wine (148 mL), or one 1 oz glass of hard liquor (44 mL). Lifestyle Brush your teeth every morning and night with fluoride toothpaste. Floss one time each day. Exercise for at least 30 minutes  5 or more days each week. Do not use any products that contain nicotine  or tobacco. These products include cigarettes, chewing tobacco, and vaping devices, such as e-cigarettes. If you need help quitting, ask your health care provider. Do not use drugs. If you are sexually active, practice safe sex. Use a condom or other form of protection to prevent STIs. If you do not wish to become pregnant, use a form of birth control. If you plan to become pregnant, see your health care provider for a prepregnancy visit. Take aspirin only as told by your health care provider. Make sure that you understand how much to take and what form to take. Work with your health care provider to find out whether it is safe and beneficial for you to take aspirin daily. Find healthy ways to manage stress, such as: Meditation, yoga, or listening to music. Journaling. Talking to a trusted person. Spending time with friends and family. Minimize exposure to UV radiation to reduce your risk of skin cancer. Safety Always wear your seat belt while driving or riding in a vehicle. Do not drive: If you have been drinking alcohol. Do not ride with someone who has been drinking. When you are tired or distracted. While texting. If you have been using any mind-altering substances or drugs. Wear a helmet and other protective equipment during sports activities. If you have firearms in your house, make sure you follow all gun safety procedures. Seek help if you have been physically or sexually abused. What's next? Visit your health care provider once a year for an annual wellness visit. Ask your health care provider how often you should have your eyes and teeth checked. Stay up to date on all vaccines. This information is not intended to replace advice given to you by your health care provider. Make sure you discuss any questions you have with your health care provider. Document Revised: 12/31/2020 Document Reviewed:  12/31/2020 Elsevier Patient Education  2024 [employer].

## 2024-04-30 NOTE — Patient Instructions (Signed)
 I will see you in 6 months  Your breathing study is fairly good There is the slight reduction in how you extremely oxygen  Continue exercising on a regular basis  No clear indication to start regular maintenance inhalers  You may still use albuterol as needed if you have wheezing or shortness of breath  Call us  with significant concerns  Make sure you follow-up with your CT scan on a yearly basis  Continue to work on quitting completely

## 2024-04-30 NOTE — Progress Notes (Signed)
 Subjective:    Patient ID: Laurie Chang, female    DOB: Nov 24, 1965, 58 y.o.   MRN: [id number]  HPI  Pt in for wellness exam. Fasting.   Pt working at [employer]. Pt is eating better and states fluctuating weight loss and gain.  Pt is dong some light weights and sit ups at home. 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 Pt states smoke 2-3 cigaretes a day. Pt failed various cessation methods incuding wellbutrin (she used for one month only).   No alcohol use. Pt drinks 1.5 cups of coffee a day.   Pt agreed to flu vaccine after counseling.  Pt has had ct lung cancer screening test in past. 12-19-2023  IMPRESSION: 1. Lung-RADS 1, negative. Continue annual screening with low-dose chest CT without contrast in 12 months. 2. Age advanced three-vessel coronary artery calcification. 3.  Aortic atherosclerosis (ICD10-I70.0). 4.  Emphysema (ICD10-J43.9).   On review pt had hysterectomy in 2012. Pt had mammogram 04-12-2024.   Htn- bp well controlled on losartan . Anxiety- well controlled with sertraline .   Review of Systems  Constitutional:  Negative for chills, fatigue and fever.  Respiratory:  Negative for cough, chest tightness and wheezing.   Cardiovascular:  Negative for chest pain and palpitations.  Gastrointestinal:  Negative for abdominal pain, blood in stool, diarrhea and nausea.  Genitourinary:  Negative for dysuria, flank pain and frequency.  Musculoskeletal:  Negative for back pain and joint swelling.       Knee pain.  Skin:  Negative for rash.  Neurological:  Negative for dizziness, speech difficulty, weakness and light-headedness.  Hematological:  Negative for adenopathy. Does not bruise/bleed easily.  Psychiatric/Behavioral:  Negative for behavioral problems, dysphoric mood and sleep disturbance. The patient is not nervous/anxious.     Past Medical History:  Diagnosis Date   Arthritis    Asthma    as a child    Hyperlipidemia     Hypertension    Leiomyoma uteri    Smoker    1/2 PPD   Syncope and collapse      Social History   Socioeconomic History   Marital status: Married    Spouse name: Not on file   Number of children: 3   Years of education: 12   Highest education level: Some college, no degree  Occupational History   Occupation: Med Theme park manager: Psychologist, prison and probation services LIVING    Comment: [facility]  Tobacco Use   Smoking status: Every Day    Current packs/day: 0.50    Average packs/day: 0.5 packs/day for 30.0 years (15.0 ttl pk-yrs)    Types: Cigarettes   Smokeless tobacco: Never   Tobacco comments:    A pack last 3 days   Vaping Use   Vaping status: Never Used  Substance and Sexual Activity   Alcohol use: Not Currently    Comment: once in a blue moon   Drug use: No   Sexual activity: Yes    Partners: Male    Birth control/protection: Surgical  Other Topics Concern   Not on file  Social History Narrative   Widowed in 2012.  Lives with her daughter and two granddaughters.   Social Drivers of Corporate investment banker Strain: Low Risk  (12/24/2022)   Overall Financial Resource Strain (CARDIA)    Difficulty of Paying Living Expenses: Not hard at all  Food Insecurity: No Food Insecurity (12/24/2022)   Hunger Vital Sign  Worried About Programme researcher, broadcasting/film/video in the Last Year: Never true    Ran Out of Food in the Last Year: Never true  Transportation Needs: No Transportation Needs (12/24/2022)   PRAPARE - Administrator, Civil Service (Medical): No    Lack of Transportation (Non-Medical): No  Physical Activity: Insufficiently Active (12/24/2022)   Exercise Vital Sign    Days of Exercise per Week: 5 days    Minutes of Exercise per Session: 20 min  Stress: Stress Concern Present (12/24/2022)   [employer] of Occupational Health - Occupational Stress Questionnaire    Feeling of Stress : Rather much  Social Connections: Moderately Isolated (12/24/2022)   Social  Connection and Isolation Panel    Frequency of Communication with Friends and Family: More than three times a week    Frequency of Social Gatherings with Friends and Family: Once a week    Attends Religious Services: Never    Database administrator or Organizations: No    Attends Engineer, structural: Not on file    Marital Status: Married  Intimate Partner Violence: Unknown (10/21/2021)   Received from [facility]   HITS    Physically Hurt: Not on file    Insult or Talk Down To: Not on file    Threaten Physical Harm: Not on file    Scream or Curse: Not on file    Past Surgical History:  Procedure Laterality Date   ABDOMINAL HYSTERECTOMY  12/11/10   TAH,RSO   HYSTEROSCOPY  11/2000   HYSTEROSCOPIC MYOMECTOMY   LAPAROSCOPIC CHOLECYSTECTOMY     OOPHORECTOMY  12/11/10   TAH,RSO   TUBAL LIGATION     VEIN SURGERY      Family History  Problem Relation Age of Onset   Cancer Mother        LUNG   Hypertension Father    Heart disease Father    Hypertension Brother    Cancer Maternal Aunt 50       breast   Cancer Maternal Grandmother 64       breast   Breast cancer Maternal Grandmother    Colon cancer Neg Hx    Colon polyps Neg Hx    Esophageal cancer Neg Hx    Stomach cancer Neg Hx    Rectal cancer Neg Hx     No Known Allergies  Current Outpatient Medications on File Prior to Visit  Medication Sig Dispense Refill   atorvastatin  (LIPITOR) 10 MG tablet Take 1 tablet by mouth once daily 90 tablet 0   cyclobenzaprine  (FLEXERIL ) 10 MG tablet TAKE 1/2 TO 1 (ONE-HALF TO ONE) TABLET BY MOUTH THREE TIMES DAILY AS NEEDED FOR MUSCLE SPASM (Patient taking differently: Take 5-10 mg by mouth 3 (three) times daily as needed for muscle spasms. TAKE 1/2 TO 1 (ONE-HALF TO ONE) TABLET BY MOUTH THREE TIMES DAILY AS NEEDED FOR MUSCLE SPASM) 21 tablet 0   losartan -hydrochlorothiazide (HYZAAR) 100-12.5 MG tablet Take 1 tablet by mouth once daily 90 tablet 0   metFORMIN  (GLUCOPHAGE ) 500  MG tablet Take 1 tablet (500 mg total) by mouth 2 (two) times daily with a meal. 180 tablet 0   metoprolol  succinate (TOPROL -XL) 50 MG 24 hr tablet Take 1 tablet (50 mg total) by mouth daily. Take with or immediately following a meal. 90 tablet 3   Multiple Vitamins-Minerals (ONE-A-DAY WOMENS PO) Take 1 tablet by mouth daily.     sertraline  (ZOLOFT ) 25 MG tablet Take 1 tablet (25 mg  total) by mouth daily. 90 tablet 0   traMADol  (ULTRAM ) 50 MG tablet Take 1 tablet (50 mg total) by mouth every 6 (six) hours as needed. (Patient taking differently: Take 50 mg by mouth every 6 (six) hours as needed for moderate pain (pain score 4-6) or severe pain (pain score 7-10).) 15 tablet 0   traMADol  (ULTRAM ) 50 MG tablet TAKE 1 TABLET BY MOUTH EVERY 8 HOURS AS NEEDED SEVERE  KNEE  PAIN 20 tablet 0   VENTOLIN HFA 108 (90 Base) MCG/ACT inhaler Inhale 2 puffs into the lungs every 4 (four) hours as needed for wheezing or shortness of breath.     No current facility-administered medications on file prior to visit.    BP 112/80   Pulse 67   Temp 98 F (36.7 C) (Oral)   Resp 15   Ht 5' 5 (1.651 m)   Wt 206 lb 9.6 oz (93.7 kg)   LMP 08/19/2010   SpO2 99%   BMI 34.38 kg/m        Objective:   Physical Exam  General Mental Status- Alert. General Appearance- Not in acute distress.    Skin General: Color- Normal Color. Moisture- Normal Moisture.   Neck Carotid Arteries- Normal color. Moisture- Normal Moisture. No carotid bruits. No JVD.   Chest and Lung Exam Auscultation: Breath Sounds:-Normal.   Cardiovascular Auscultation:Rythm- Regular. Murmurs & Other Heart Sounds:Auscultation of the heart reveals- No Murmurs.   Abdomen Inspection:-Inspeection Normal. Palpation/Percussion:Note:No mass. Palpation and Percussion of the abdomen reveal- Non Tender, Non Distended + BS, no rebound or guarding.     Neurologic Cranial Nerve exam:- CN III-XII intact(No nystagmus), symmetric  smile. al/IntactStrength:- 5/5 equal and symmetric strength both upper and lower extremities.   Derm- on review back no worrisome moles on lesions.    Assessment & Plan:   For you wellness exam today I have ordered cbc, cmp, A1c and lipid panel.   Flu vaccine today.   Mammogram up to date. Hx of hysterectomy   Recommend exercise and healthy diet.   We will let you know lab results as they come in.   Follow up early May uds and controlled med viist.   Chronic knee pain. Up to date on contract and uds. Continue meloxicam  and tramadol . Rx advisement given.   Htn-  controlled. pt is on losartan  100-12.5 mg daily.  Anxiety- controlled with sertraline .   [id number] charge as reviewed htn, anxiety and chronic knee pain. Refill meds when needed.

## 2024-04-30 NOTE — Progress Notes (Signed)
 Laurie Chang    [id number]    1965-11-20  Primary Care Physician:[place], [place] RIGGERS  Referring Physician: Neda Jennet LABOR, MD [address] [place],  [place] [address]  Chief complaint:   Abnormal CT showing bronchiolitis and emphysema  HPI:  Active smoker, down to a pack of cigarettes lasting about 3 days Recently had a low-dose CT scan showing bronchiolitis and emphysema  She is in for follow-up today She is doing relatively well  Has cut down to about 3 to 5 cigarettes a day  She does have arthritis in her knees that limits her breathing  Tried nicotine  patch, Chantix  in the past  History of asthma which she grew out of  Has a history of hypertension, hypercholesterolemia  Outpatient Encounter Medications as of 04/30/2024  Medication Sig   atorvastatin  (LIPITOR) 10 MG tablet Take 1 tablet by mouth once daily   cyclobenzaprine  (FLEXERIL ) 10 MG tablet TAKE 1/2 TO 1 (ONE-HALF TO ONE) TABLET BY MOUTH THREE TIMES DAILY AS NEEDED FOR MUSCLE SPASM (Patient taking differently: Take 5-10 mg by mouth 3 (three) times daily as needed for muscle spasms. TAKE 1/2 TO 1 (ONE-HALF TO ONE) TABLET BY MOUTH THREE TIMES DAILY AS NEEDED FOR MUSCLE SPASM)   losartan -hydrochlorothiazide (HYZAAR) 100-12.5 MG tablet Take 1 tablet by mouth once daily   metFORMIN  (GLUCOPHAGE ) 500 MG tablet Take 1 tablet (500 mg total) by mouth 2 (two) times daily with a meal.   metoprolol  succinate (TOPROL -XL) 50 MG 24 hr tablet Take 1 tablet (50 mg total) by mouth daily. Take with or immediately following a meal.   Multiple Vitamins-Minerals (ONE-A-DAY WOMENS PO) Take 1 tablet by mouth daily.   sertraline  (ZOLOFT ) 25 MG tablet Take 1 tablet (25 mg total) by mouth daily.   traMADol  (ULTRAM ) 50 MG tablet Take 1 tablet (50 mg total) by mouth every 6 (six) hours as needed. (Patient taking differently: Take 50 mg by mouth every 6 (six) hours as needed for moderate pain (pain score 4-6) or  severe pain (pain score 7-10).)   traMADol  (ULTRAM ) 50 MG tablet TAKE 1 TABLET BY MOUTH EVERY 8 HOURS AS NEEDED SEVERE  KNEE  PAIN   VENTOLIN HFA 108 (90 Base) MCG/ACT inhaler Inhale 2 puffs into the lungs every 4 (four) hours as needed for wheezing or shortness of breath.   No facility-administered encounter medications on file as of 04/30/2024.    Allergies as of 04/30/2024   (No Known Allergies)    Past Medical History:  Diagnosis Date   Arthritis    Asthma    as a child    Hyperlipidemia    Hypertension    Leiomyoma uteri    Smoker    1/2 PPD   Syncope and collapse     Past Surgical History:  Procedure Laterality Date   ABDOMINAL HYSTERECTOMY  12/11/10   TAH,RSO   HYSTEROSCOPY  11/2000   HYSTEROSCOPIC MYOMECTOMY   LAPAROSCOPIC CHOLECYSTECTOMY     OOPHORECTOMY  12/11/10   TAH,RSO   TUBAL LIGATION     VEIN SURGERY      Family History  Problem Relation Age of Onset   Cancer Mother        LUNG   Hypertension Father    Heart disease Father    Hypertension Brother    Cancer Maternal Aunt 42       breast   Cancer Maternal Grandmother 41  breast   Breast cancer Maternal Grandmother    Colon cancer Neg Hx    Colon polyps Neg Hx    Esophageal cancer Neg Hx    Stomach cancer Neg Hx    Rectal cancer Neg Hx     Social History   Socioeconomic History   Marital status: Married    Spouse name: Not on file   Number of children: 3   Years of education: 12   Highest education level: Some college, no degree  Occupational History   Occupation: Med Theme park manager: Psychologist, prison and probation services LIVING    Comment: [employer]  Tobacco Use   Smoking status: Every Day    Current packs/day: 0.50    Average packs/day: 0.5 packs/day for 30.0 years (15.0 ttl pk-yrs)    Types: Cigarettes   Smokeless tobacco: Never   Tobacco comments:    A pack last 3 days   Vaping Use   Vaping status: Never Used  Substance and Sexual Activity   Alcohol use: Not  Currently    Comment: once in a blue moon   Drug use: No   Sexual activity: Yes    Partners: Male    Birth control/protection: Surgical  Other Topics Concern   Not on file  Social History Narrative   Widowed in 2012.  Lives with her daughter and two granddaughters.   Social Drivers of Corporate investment banker Strain: Low Risk  (12/24/2022)   Overall Financial Resource Strain (CARDIA)    Difficulty of Paying Living Expenses: Not hard at all  Food Insecurity: No Food Insecurity (12/24/2022)   Hunger Vital Sign    Worried About Running Out of Food in the Last Year: Never true    Ran Out of Food in the Last Year: Never true  Transportation Needs: No Transportation Needs (12/24/2022)   PRAPARE - Administrator, Civil Service (Medical): No    Lack of Transportation (Non-Medical): No  Physical Activity: Insufficiently Active (12/24/2022)   Exercise Vital Sign    Days of Exercise per Week: 5 days    Minutes of Exercise per Session: 20 min  Stress: Stress Concern Present (12/24/2022)   [employer] of Occupational Health - Occupational Stress Questionnaire    Feeling of Stress : Rather much  Social Connections: Moderately Isolated (12/24/2022)   Social Connection and Isolation Panel    Frequency of Communication with Friends and Family: More than three times a week    Frequency of Social Gatherings with Friends and Family: Once a week    Attends Religious Services: Never    Database administrator or Organizations: No    Attends Engineer, structural: Not on file    Marital Status: Married  Intimate Partner Violence: Unknown (10/21/2021)   Received from [facility]   HITS    Physically Hurt: Not on file    Insult or Talk Down To: Not on file    Threaten Physical Harm: Not on file    Scream or Curse: Not on file   Review of Systems  Respiratory:  Positive for shortness of breath.    Vitals:   04/30/24 1550  BP: 108/71  Pulse: 80  SpO2: 98%   Physical  Exam Constitutional:      Appearance: She is obese.  HENT:     Head: Normocephalic.     Nose: No congestion.  Eyes:     General: No scleral icterus. Cardiovascular:  Rate and Rhythm: Normal rate and regular rhythm.     Heart sounds: No murmur heard.    No friction rub.  Pulmonary:     Effort: No respiratory distress.     Breath sounds: No stridor. No wheezing or rhonchi.  Musculoskeletal:     Cervical back: No rigidity or tenderness.  Neurological:     Mental Status: She is alert.  Psychiatric:        Mood and Affect: Mood normal.    Data Reviewed: CT scan of the chest 11/30/2023 reviewed by myself  PFT reviewed showing no obstruction, no significant bronchodilator response, no restriction with mild reduction in diffusing capacity  Assessment:  Emphysema, bronchiolitis on recent CT scan  Active smoker - Continue to work on quitting  Class I obesity - Tries to stay active - Exercises regularly-uses an elliptical  Plan/Recommendations: Smoking cessation counseling  Continue low-dose CT screening  Follow-up in about 6 months  Use albuterol as needed  No clear indication for a maintenance inhaler at present  Importance of making sure she quits sooner than later was discussed   Jennet Epley MD [facility] 04/30/2024, 3:54 PM  CC: Epley Jennet LABOR, MD

## 2024-05-02 ENCOUNTER — Other Ambulatory Visit: Payer: Self-pay | Admitting: Family

## 2024-05-02 DIAGNOSIS — I10 Essential (primary) hypertension: Secondary | ICD-10-CM | POA: Diagnosis not present

## 2024-05-02 DIAGNOSIS — E669 Obesity, unspecified: Secondary | ICD-10-CM | POA: Diagnosis not present

## 2024-05-02 DIAGNOSIS — G479 Sleep disorder, unspecified: Secondary | ICD-10-CM | POA: Diagnosis not present

## 2024-05-02 DIAGNOSIS — R7303 Prediabetes: Secondary | ICD-10-CM | POA: Diagnosis not present

## 2024-05-02 DIAGNOSIS — Z1331 Encounter for screening for depression: Secondary | ICD-10-CM | POA: Diagnosis not present

## 2024-05-02 DIAGNOSIS — E7800 Pure hypercholesterolemia, unspecified: Secondary | ICD-10-CM | POA: Diagnosis not present

## 2024-05-07 ENCOUNTER — Encounter: Payer: Self-pay | Admitting: Medical

## 2024-05-07 ENCOUNTER — Telehealth: Payer: Self-pay | Admitting: Medical

## 2024-05-07 MED ORDER — TRAMADOL HCL 50 MG PO TABS: 50.0000 mg | ORAL_TABLET | Freq: Three times a day (TID) | ORAL | 0 refills | Status: DC | PRN

## 2024-05-07 NOTE — Telephone Encounter (Signed)
 Rx request for tramadol  sent to pharmacy.   Requesting: tramadol  Contract: Yes 12/15/23 UDS: 11/23/23 Last Visit: 04/30/2024 Next Visit: Visit date not found Last Refill: 02/09/24   Please Advise

## 2024-05-09 DIAGNOSIS — E7800 Pure hypercholesterolemia, unspecified: Secondary | ICD-10-CM | POA: Diagnosis not present

## 2024-05-09 DIAGNOSIS — Z6833 Body mass index (BMI) 33.0-33.9, adult: Secondary | ICD-10-CM | POA: Diagnosis not present

## 2024-05-16 DIAGNOSIS — I10 Essential (primary) hypertension: Secondary | ICD-10-CM | POA: Diagnosis not present

## 2024-05-16 DIAGNOSIS — Z6833 Body mass index (BMI) 33.0-33.9, adult: Secondary | ICD-10-CM | POA: Diagnosis not present

## 2024-05-23 DIAGNOSIS — I10 Essential (primary) hypertension: Secondary | ICD-10-CM | POA: Diagnosis not present

## 2024-05-23 DIAGNOSIS — Z6832 Body mass index (BMI) 32.0-32.9, adult: Secondary | ICD-10-CM | POA: Diagnosis not present

## 2024-05-24 ENCOUNTER — Other Ambulatory Visit (INDEPENDENT_AMBULATORY_CARE_PROVIDER_SITE_OTHER)

## 2024-05-24 DIAGNOSIS — Z006 Encounter for examination for normal comparison and control in clinical research program: Secondary | ICD-10-CM | POA: Diagnosis not present

## 2024-06-04 ENCOUNTER — Other Ambulatory Visit: Payer: Self-pay | Admitting: Medical

## 2024-06-04 NOTE — Telephone Encounter (Signed)
Rx refill sent to pt pharmacy 

## 2024-06-04 NOTE — Telephone Encounter (Signed)
 Requesting: tramadol  50mg   Contract:11/23/23 UDS: 11/23/23 Last Visit: 04/30/24 Next Visit: None Last Refill: 11/02/23 #15 and 0RF   Please Advise

## 2024-06-05 LAB — GENECONNECT MOLECULAR SCREEN: Genetic Analysis Overall Interpretation: NEGATIVE

## 2024-06-07 ENCOUNTER — Other Ambulatory Visit: Payer: Self-pay | Admitting: Family Medicine

## 2024-06-18 ENCOUNTER — Ambulatory Visit: Attending: Cardiology | Admitting: Cardiology

## 2024-06-26 ENCOUNTER — Other Ambulatory Visit: Payer: Self-pay | Admitting: Medical

## 2024-06-28 NOTE — Telephone Encounter (Signed)
Rx refill sent to pt pharmacy 

## 2024-07-16 ENCOUNTER — Other Ambulatory Visit: Payer: Self-pay | Admitting: Medical

## 2024-07-24 ENCOUNTER — Other Ambulatory Visit: Payer: Self-pay | Admitting: Medical

## 2024-07-25 NOTE — Telephone Encounter (Signed)
Rx refill sent to pt pharmacy 

## 2024-08-15 ENCOUNTER — Other Ambulatory Visit: Payer: Self-pay | Admitting: Medical

## 2024-08-16 NOTE — Telephone Encounter (Signed)
 Refilled pt tramadol . Have her schedule appointment in early April

## 2024-08-16 NOTE — Telephone Encounter (Signed)
 Called pt to notify her of uds,contract, and 6 month control med visit needs to be done by early April. 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 Told her that I understood and to just keep that date in mind
